# Patient Record
Sex: Female | Born: 1935 | Race: White | Hispanic: No | Smoking: Former smoker
Health system: Southern US, Community
[De-identification: ages and names within clinical notes are randomized; demographics above are authoritative.]

## PROBLEM LIST (undated history)

## (undated) DIAGNOSIS — I10 Essential (primary) hypertension: Secondary | ICD-10-CM

## (undated) DIAGNOSIS — C801 Malignant (primary) neoplasm, unspecified: Secondary | ICD-10-CM

## (undated) DIAGNOSIS — Z950 Presence of cardiac pacemaker: Secondary | ICD-10-CM

## (undated) DIAGNOSIS — M810 Age-related osteoporosis without current pathological fracture: Secondary | ICD-10-CM

## (undated) DIAGNOSIS — I214 Non-ST elevation (NSTEMI) myocardial infarction: Secondary | ICD-10-CM

## (undated) DIAGNOSIS — F32A Depression, unspecified: Secondary | ICD-10-CM

## (undated) DIAGNOSIS — I4891 Unspecified atrial fibrillation: Secondary | ICD-10-CM

## (undated) DIAGNOSIS — I2510 Atherosclerotic heart disease of native coronary artery without angina pectoris: Secondary | ICD-10-CM

## (undated) DIAGNOSIS — IMO0001 Reserved for inherently not codable concepts without codable children: Secondary | ICD-10-CM

## (undated) DIAGNOSIS — F419 Anxiety disorder, unspecified: Secondary | ICD-10-CM

## (undated) DIAGNOSIS — Z9289 Personal history of other medical treatment: Secondary | ICD-10-CM

## (undated) DIAGNOSIS — F329 Major depressive disorder, single episode, unspecified: Secondary | ICD-10-CM

## (undated) DIAGNOSIS — R011 Cardiac murmur, unspecified: Secondary | ICD-10-CM

## (undated) DIAGNOSIS — Z860100 Personal history of colon polyps, unspecified: Secondary | ICD-10-CM

## (undated) DIAGNOSIS — Z8619 Personal history of other infectious and parasitic diseases: Secondary | ICD-10-CM

## (undated) DIAGNOSIS — M1990 Unspecified osteoarthritis, unspecified site: Secondary | ICD-10-CM

## (undated) DIAGNOSIS — N3010 Interstitial cystitis (chronic) without hematuria: Secondary | ICD-10-CM

## (undated) DIAGNOSIS — K219 Gastro-esophageal reflux disease without esophagitis: Secondary | ICD-10-CM

## (undated) DIAGNOSIS — I499 Cardiac arrhythmia, unspecified: Secondary | ICD-10-CM

## (undated) DIAGNOSIS — E785 Hyperlipidemia, unspecified: Secondary | ICD-10-CM

## (undated) HISTORY — DX: Anxiety disorder, unspecified: F41.9

## (undated) HISTORY — PX: EYE SURGERY: SHX253

## (undated) HISTORY — DX: Depression, unspecified: F32.A

## (undated) HISTORY — DX: Interstitial cystitis (chronic) without hematuria: N30.10

## (undated) HISTORY — DX: Personal history of colonic polyps: Z86.010

## (undated) HISTORY — PX: INSERT / REPLACE / REMOVE PACEMAKER: SUR710

## (undated) HISTORY — PX: CORONARY ANGIOPLASTY: SHX604

## (undated) HISTORY — DX: Atherosclerotic heart disease of native coronary artery without angina pectoris: I25.10

## (undated) HISTORY — DX: Personal history of colon polyps, unspecified: Z86.0100

## (undated) HISTORY — DX: Gastro-esophageal reflux disease without esophagitis: K21.9

## (undated) HISTORY — DX: Unspecified atrial fibrillation: I48.91

## (undated) HISTORY — PX: CARDIAC CATHETERIZATION: SHX172

## (undated) HISTORY — DX: Non-ST elevation (NSTEMI) myocardial infarction: I21.4

## (undated) HISTORY — DX: Unspecified osteoarthritis, unspecified site: M19.90

## (undated) HISTORY — DX: Hyperlipidemia, unspecified: E78.5

## (undated) HISTORY — DX: Major depressive disorder, single episode, unspecified: F32.9

## (undated) HISTORY — DX: Age-related osteoporosis without current pathological fracture: M81.0

---

## 1995-02-05 ENCOUNTER — Encounter: Payer: Self-pay | Admitting: Internal Medicine

## 1995-02-05 LAB — CONVERTED CEMR LAB
Blood Glucose, Fasting: 91 mg/dL
TSH: 2.64 microintl units/mL

## 1995-02-06 ENCOUNTER — Encounter: Payer: Self-pay | Admitting: Internal Medicine

## 1995-02-06 LAB — CONVERTED CEMR LAB
TSH: 2.64 microintl units/mL
WBC, blood: 4.6 10*3/uL

## 1995-02-12 ENCOUNTER — Encounter: Payer: Self-pay | Admitting: Internal Medicine

## 1996-02-08 ENCOUNTER — Encounter: Payer: Self-pay | Admitting: Internal Medicine

## 1996-02-08 LAB — CONVERTED CEMR LAB: TSH: 3.39 microintl units/mL

## 1996-02-10 ENCOUNTER — Encounter: Payer: Self-pay | Admitting: Internal Medicine

## 1997-02-13 ENCOUNTER — Encounter: Payer: Self-pay | Admitting: Internal Medicine

## 1997-02-13 LAB — CONVERTED CEMR LAB
Blood Glucose, Fasting: 91 mg/dL
Pap Smear: NORMAL

## 1997-11-02 ENCOUNTER — Encounter: Payer: Self-pay | Admitting: Internal Medicine

## 1997-11-02 LAB — CONVERTED CEMR LAB: TSH: 5 microintl units/mL

## 1998-02-14 ENCOUNTER — Encounter: Payer: Self-pay | Admitting: Internal Medicine

## 1998-02-14 LAB — CONVERTED CEMR LAB: RBC count: 4.39 10*6/uL

## 1998-02-16 ENCOUNTER — Encounter: Payer: Self-pay | Admitting: Internal Medicine

## 1998-02-16 LAB — CONVERTED CEMR LAB: Pap Smear: NORMAL

## 1999-03-13 ENCOUNTER — Encounter: Payer: Self-pay | Admitting: Internal Medicine

## 1999-03-13 LAB — CONVERTED CEMR LAB: Blood Glucose, Fasting: 82 mg/dL

## 1999-03-14 ENCOUNTER — Encounter: Payer: Self-pay | Admitting: Internal Medicine

## 2000-04-27 ENCOUNTER — Encounter: Payer: Self-pay | Admitting: Internal Medicine

## 2000-04-27 LAB — CONVERTED CEMR LAB
Blood Glucose, Fasting: 80 mg/dL
Pap Smear: NORMAL

## 2001-04-30 ENCOUNTER — Encounter: Payer: Self-pay | Admitting: Internal Medicine

## 2001-06-11 ENCOUNTER — Encounter: Payer: Self-pay | Admitting: Internal Medicine

## 2002-05-04 ENCOUNTER — Encounter: Payer: Self-pay | Admitting: Internal Medicine

## 2002-05-05 ENCOUNTER — Encounter: Payer: Self-pay | Admitting: Internal Medicine

## 2002-05-05 LAB — CONVERTED CEMR LAB: Blood Glucose, Fasting: 99 mg/dL

## 2003-07-21 ENCOUNTER — Other Ambulatory Visit: Admission: RE | Admit: 2003-07-21 | Discharge: 2003-07-21 | Payer: Self-pay | Admitting: Internal Medicine

## 2003-07-21 ENCOUNTER — Encounter: Payer: Self-pay | Admitting: Internal Medicine

## 2003-08-19 HISTORY — PX: CATARACT EXTRACTION: SUR2

## 2004-08-13 ENCOUNTER — Emergency Department: Payer: Self-pay | Admitting: Emergency Medicine

## 2004-08-13 ENCOUNTER — Encounter: Payer: Self-pay | Admitting: Internal Medicine

## 2004-08-13 ENCOUNTER — Other Ambulatory Visit: Payer: Self-pay

## 2004-08-13 LAB — CONVERTED CEMR LAB
RBC count: 4.55 10*6/uL
WBC, blood: 5.9 10*3/uL

## 2004-08-15 ENCOUNTER — Ambulatory Visit: Payer: Self-pay | Admitting: Family Medicine

## 2004-08-20 ENCOUNTER — Ambulatory Visit: Payer: Self-pay | Admitting: Internal Medicine

## 2004-09-16 ENCOUNTER — Ambulatory Visit: Payer: Self-pay | Admitting: Internal Medicine

## 2004-12-13 ENCOUNTER — Ambulatory Visit: Payer: Self-pay | Admitting: Internal Medicine

## 2005-03-12 ENCOUNTER — Encounter: Payer: Self-pay | Admitting: Internal Medicine

## 2005-05-06 ENCOUNTER — Ambulatory Visit: Payer: Self-pay | Admitting: Internal Medicine

## 2005-08-18 HISTORY — PX: COLONOSCOPY: SHX174

## 2005-09-26 ENCOUNTER — Ambulatory Visit: Payer: Self-pay | Admitting: Internal Medicine

## 2006-03-27 ENCOUNTER — Ambulatory Visit: Payer: Self-pay | Admitting: Internal Medicine

## 2006-04-01 ENCOUNTER — Ambulatory Visit: Payer: Self-pay | Admitting: Unknown Physician Specialty

## 2006-07-06 ENCOUNTER — Ambulatory Visit (HOSPITAL_BASED_OUTPATIENT_CLINIC_OR_DEPARTMENT_OTHER): Admission: RE | Admit: 2006-07-06 | Discharge: 2006-07-06 | Payer: Self-pay | Admitting: Urology

## 2006-09-29 ENCOUNTER — Ambulatory Visit: Payer: PPO | Admitting: Internal Medicine

## 2007-04-30 ENCOUNTER — Ambulatory Visit: Payer: Self-pay | Admitting: Internal Medicine

## 2007-04-30 DIAGNOSIS — R350 Frequency of micturition: Secondary | ICD-10-CM

## 2007-05-26 ENCOUNTER — Telehealth (INDEPENDENT_AMBULATORY_CARE_PROVIDER_SITE_OTHER): Payer: Self-pay | Admitting: *Deleted

## 2007-07-19 ENCOUNTER — Telehealth (INDEPENDENT_AMBULATORY_CARE_PROVIDER_SITE_OTHER): Payer: Self-pay | Admitting: *Deleted

## 2007-07-23 ENCOUNTER — Encounter: Payer: Self-pay | Admitting: Internal Medicine

## 2007-07-23 DIAGNOSIS — E785 Hyperlipidemia, unspecified: Secondary | ICD-10-CM

## 2007-07-23 DIAGNOSIS — F39 Unspecified mood [affective] disorder: Secondary | ICD-10-CM

## 2007-07-23 DIAGNOSIS — F329 Major depressive disorder, single episode, unspecified: Secondary | ICD-10-CM | POA: Insufficient documentation

## 2007-07-23 DIAGNOSIS — K449 Diaphragmatic hernia without obstruction or gangrene: Secondary | ICD-10-CM | POA: Insufficient documentation

## 2007-07-23 DIAGNOSIS — N3010 Interstitial cystitis (chronic) without hematuria: Secondary | ICD-10-CM

## 2007-07-23 DIAGNOSIS — M810 Age-related osteoporosis without current pathological fracture: Secondary | ICD-10-CM | POA: Insufficient documentation

## 2007-07-23 DIAGNOSIS — Z8719 Personal history of other diseases of the digestive system: Secondary | ICD-10-CM

## 2007-07-23 DIAGNOSIS — M1990 Unspecified osteoarthritis, unspecified site: Secondary | ICD-10-CM | POA: Insufficient documentation

## 2007-07-23 DIAGNOSIS — D126 Benign neoplasm of colon, unspecified: Secondary | ICD-10-CM

## 2007-07-23 DIAGNOSIS — I25119 Atherosclerotic heart disease of native coronary artery with unspecified angina pectoris: Secondary | ICD-10-CM

## 2007-07-24 DIAGNOSIS — K219 Gastro-esophageal reflux disease without esophagitis: Secondary | ICD-10-CM

## 2007-08-24 ENCOUNTER — Telehealth (INDEPENDENT_AMBULATORY_CARE_PROVIDER_SITE_OTHER): Payer: Self-pay | Admitting: *Deleted

## 2007-09-01 ENCOUNTER — Telehealth: Payer: Self-pay | Admitting: Internal Medicine

## 2007-10-13 ENCOUNTER — Encounter: Payer: Self-pay | Admitting: Internal Medicine

## 2007-10-13 ENCOUNTER — Ambulatory Visit: Payer: PPO | Admitting: Internal Medicine

## 2007-10-19 ENCOUNTER — Encounter (INDEPENDENT_AMBULATORY_CARE_PROVIDER_SITE_OTHER): Payer: Self-pay | Admitting: *Deleted

## 2007-10-19 LAB — HM MAMMOGRAPHY: HM Mammogram: NORMAL

## 2007-11-23 ENCOUNTER — Encounter: Payer: Self-pay | Admitting: Internal Medicine

## 2007-12-17 ENCOUNTER — Ambulatory Visit: Payer: Self-pay | Admitting: Internal Medicine

## 2007-12-20 LAB — CONVERTED CEMR LAB
Albumin: 4.4 g/dL (ref 3.5–5.2)
Basophils Absolute: 0 10*3/uL (ref 0.0–0.1)
Bilirubin, Direct: 0.1 mg/dL (ref 0.0–0.3)
CO2: 30 meq/L (ref 19–32)
Calcium: 10.2 mg/dL (ref 8.4–10.5)
Chloride: 107 meq/L (ref 96–112)
Creatinine, Ser: 0.9 mg/dL (ref 0.4–1.2)
Eosinophils Relative: 2.3 % (ref 0.0–5.0)
GFR calc Af Amer: 78 mL/min
Glucose, Bld: 99 mg/dL (ref 70–99)
HCT: 42.6 % (ref 36.0–46.0)
Lymphocytes Relative: 29.8 % (ref 12.0–46.0)
MCHC: 34.2 g/dL (ref 30.0–36.0)
Neutrophils Relative %: 55.5 % (ref 43.0–77.0)
Platelets: 218 10*3/uL (ref 150–400)
Potassium: 5.5 meq/L — ABNORMAL HIGH (ref 3.5–5.1)
RBC: 4.52 M/uL (ref 3.87–5.11)
RDW: 12.5 % (ref 11.5–14.6)
Sodium: 143 meq/L (ref 135–145)
Total CHOL/HDL Ratio: 3.6
Total Protein: 7.2 g/dL (ref 6.0–8.3)
Triglycerides: 110 mg/dL (ref 0–149)
WBC: 4.3 10*3/uL — ABNORMAL LOW (ref 4.5–10.5)

## 2007-12-27 ENCOUNTER — Telehealth (INDEPENDENT_AMBULATORY_CARE_PROVIDER_SITE_OTHER): Payer: Self-pay | Admitting: *Deleted

## 2008-01-25 ENCOUNTER — Telehealth: Payer: Self-pay | Admitting: Internal Medicine

## 2008-05-19 ENCOUNTER — Telehealth: Payer: Self-pay | Admitting: Internal Medicine

## 2008-09-25 ENCOUNTER — Telehealth: Payer: Self-pay | Admitting: Internal Medicine

## 2008-10-19 ENCOUNTER — Encounter: Payer: Self-pay | Admitting: Internal Medicine

## 2008-10-19 ENCOUNTER — Telehealth: Payer: Self-pay | Admitting: Internal Medicine

## 2008-10-19 ENCOUNTER — Ambulatory Visit: Payer: PPO | Admitting: Internal Medicine

## 2008-10-23 ENCOUNTER — Encounter: Payer: Self-pay | Admitting: Internal Medicine

## 2009-01-18 ENCOUNTER — Telehealth: Payer: Self-pay | Admitting: Internal Medicine

## 2009-03-09 ENCOUNTER — Telehealth: Payer: Self-pay | Admitting: Internal Medicine

## 2009-03-16 ENCOUNTER — Telehealth (INDEPENDENT_AMBULATORY_CARE_PROVIDER_SITE_OTHER): Payer: Self-pay | Admitting: *Deleted

## 2009-04-17 ENCOUNTER — Ambulatory Visit: Payer: Self-pay | Admitting: Internal Medicine

## 2009-04-18 LAB — CONVERTED CEMR LAB
ALT: 13 units/L (ref 0–35)
AST: 21 units/L (ref 0–37)
Albumin: 4.1 g/dL (ref 3.5–5.2)
Alkaline Phosphatase: 88 units/L (ref 39–117)
BUN: 17 mg/dL (ref 6–23)
Basophils Absolute: 0 10*3/uL (ref 0.0–0.1)
Cholesterol: 217 mg/dL — ABNORMAL HIGH (ref 0–200)
Creatinine, Ser: 0.8 mg/dL (ref 0.4–1.2)
Glucose, Bld: 97 mg/dL (ref 70–99)
Hemoglobin: 13.5 g/dL (ref 12.0–15.0)
Lymphocytes Relative: 24.3 % (ref 12.0–46.0)
MCHC: 34.5 g/dL (ref 30.0–36.0)
MCV: 95.6 fL (ref 78.0–100.0)
Monocytes Absolute: 0.7 10*3/uL (ref 0.1–1.0)
Platelets: 182 10*3/uL (ref 150.0–400.0)
RBC: 4.08 M/uL (ref 3.87–5.11)
TSH: 4.15 microintl units/mL (ref 0.35–5.50)
Total Protein: 7.1 g/dL (ref 6.0–8.3)
Triglycerides: 93 mg/dL (ref 0.0–149.0)
VLDL: 18.6 mg/dL (ref 0.0–40.0)
WBC: 4.7 10*3/uL (ref 4.5–10.5)

## 2009-05-07 ENCOUNTER — Ambulatory Visit: Payer: Self-pay | Admitting: Internal Medicine

## 2009-05-08 LAB — CONVERTED CEMR LAB: Potassium: 5.2 meq/L — ABNORMAL HIGH (ref 3.5–5.1)

## 2009-08-13 ENCOUNTER — Inpatient Hospital Stay: Payer: PPO | Admitting: Internal Medicine

## 2009-08-16 ENCOUNTER — Encounter: Payer: Self-pay | Admitting: Internal Medicine

## 2009-08-23 ENCOUNTER — Encounter: Payer: Self-pay | Admitting: Internal Medicine

## 2009-08-29 ENCOUNTER — Ambulatory Visit: Payer: Self-pay | Admitting: Internal Medicine

## 2009-08-29 DIAGNOSIS — I4891 Unspecified atrial fibrillation: Secondary | ICD-10-CM | POA: Insufficient documentation

## 2009-10-01 ENCOUNTER — Telehealth: Payer: Self-pay | Admitting: Internal Medicine

## 2009-10-02 ENCOUNTER — Telehealth: Payer: Self-pay | Admitting: Internal Medicine

## 2009-10-08 ENCOUNTER — Ambulatory Visit: Payer: Self-pay | Admitting: Internal Medicine

## 2009-11-27 ENCOUNTER — Ambulatory Visit: Payer: Self-pay | Admitting: Family Medicine

## 2009-11-27 DIAGNOSIS — R300 Dysuria: Secondary | ICD-10-CM

## 2009-11-27 LAB — CONVERTED CEMR LAB
Blood in Urine, dipstick: NEGATIVE
Ketones, urine, test strip: NEGATIVE
Nitrite: NEGATIVE
pH: 5

## 2009-11-28 ENCOUNTER — Encounter: Payer: Self-pay | Admitting: Family Medicine

## 2010-01-16 ENCOUNTER — Telehealth: Payer: Self-pay | Admitting: Internal Medicine

## 2010-02-04 ENCOUNTER — Ambulatory Visit: Payer: Self-pay | Admitting: Internal Medicine

## 2010-03-27 ENCOUNTER — Encounter: Payer: Self-pay | Admitting: Internal Medicine

## 2010-05-06 ENCOUNTER — Ambulatory Visit (HOSPITAL_BASED_OUTPATIENT_CLINIC_OR_DEPARTMENT_OTHER): Admission: RE | Admit: 2010-05-06 | Discharge: 2010-05-06 | Payer: Self-pay | Admitting: Urology

## 2010-05-07 ENCOUNTER — Telehealth: Payer: Self-pay | Admitting: Internal Medicine

## 2010-06-17 ENCOUNTER — Ambulatory Visit: Payer: Self-pay | Admitting: Internal Medicine

## 2010-06-17 DIAGNOSIS — K5909 Other constipation: Secondary | ICD-10-CM

## 2010-06-18 LAB — CONVERTED CEMR LAB
Alkaline Phosphatase: 110 units/L (ref 39–117)
BUN: 18 mg/dL (ref 6–23)
Basophils Absolute: 0 10*3/uL (ref 0.0–0.1)
Chloride: 96 meq/L (ref 96–112)
Cholesterol: 267 mg/dL — ABNORMAL HIGH (ref 0–200)
Direct LDL: 150.6 mg/dL
Eosinophils Absolute: 0.1 10*3/uL (ref 0.0–0.7)
GFR calc non Af Amer: 81.08 mL/min (ref >60)
HDL: 83 mg/dL (ref >39.00)
Lymphocytes Relative: 26.8 % (ref 12.0–46.0)
Lymphs Abs: 1.2 10*3/uL (ref 0.7–4.0)
MCHC: 35.5 g/dL (ref 30.0–36.0)
MCV: 93.3 fL (ref 78.0–100.0)
Phosphorus: 3.6 mg/dL (ref 2.3–4.6)
Potassium: 4.1 meq/L (ref 3.5–5.1)
Sodium: 140 meq/L (ref 135–145)
Total CHOL/HDL Ratio: 3
VLDL: 27.6 mg/dL (ref 0.0–40.0)
WBC: 4.3 10*3/uL — ABNORMAL LOW (ref 4.5–10.5)

## 2010-08-07 ENCOUNTER — Telehealth: Payer: Self-pay | Admitting: Internal Medicine

## 2010-08-26 ENCOUNTER — Telehealth: Payer: Self-pay | Admitting: Internal Medicine

## 2010-09-19 NOTE — Progress Notes (Signed)
Summary: Mammogram or not  Phone Note Call from Patient   Caller: Patient Summary of Call: Patient just received her notice thart she is due for her annual MMG at [facility]. She remembers you saying that she did not need anymore MMGs. Please advise if she should have one or not. Please call the patient back at [phone]. Initial call taken by: Carlton Adam,  October 02, 2009 11:59 AM  Follow-up for Phone Call        I really don't recommend them after 77 years of age--they can cause more trouble than they help She should let them know she is not getting it so they don't continue to send reminders Follow-up by: Cindee Salt MD,  October 02, 2009 12:46 PM  Additional Follow-up for Phone Call Additional follow up Details #1::        Patient advised as instructed. Additional Follow-up by: Linde Gillis CMA Duncan Dull),  October 02, 2009 12:51 PM

## 2010-09-19 NOTE — Letter (Signed)
[facility]   Imported By: Beau Fanny 08/29/2009 15:09:26  _____________________________________________________________________  External Attachment:    Type:   Image     Comment:   External Document

## 2010-09-19 NOTE — Assessment & Plan Note (Signed)
Summary: 4 MONTH FOLLOW UP/[facility]   Vital Signs:  Patient profile:   77 year old female Weight:      99 pounds Temp:     97.8 degrees F oral Pulse rate:   60 / minute Pulse rhythm:   regular BP sitting:   158 / 70  (left arm) Cuff size:   regular  Vitals Entered By: Mervin Hack CMA [facility] Dull) (June 17, 2010 12:23 PM) CC: 4 month follow-up   History of Present Illness: Having some trouble with constipation Not sure if her meds could be causing Hasn't used anything regularly couldn't tolerate correctol--the dye irritated her bladder discussed trying miralax  Heart has been quiet had to stop the atenolol No sig tachycardia just on the diltiazem Some leg itching and occ red but no sig edema  No chest pain Breathing has been fine  ongoing bladder problems but nothing new  Allergies: 1)  ! Asa 2)  ! * Hctz 3)  Diazepam (Diazepam)  Past History:  Past medical, surgical, family and social histories (including risk factors) reviewed for relevance to current acute and chronic problems.  Past Medical History: Reviewed history from 08/29/2009 and no changes required. Anxiety: Depression Hyperlipidemia Osteoarthritis Osteoporosis Interstitial cystitis Colonic polyps, hx of GERD Coronary artery disease Atrial fibrillation  Past Surgical History: Reviewed history from 08/29/2009 and no changes required. Colonoscopy:(1996) PTCA:(05/1998) Right CTS :(2003) Cardiolite stress negative:(10/2002) Echo-- LV EF- 60%:(10/2002) Cataract on right  ~2005 Chest pain/rapid atrial fib   12/10  Family History: Reviewed history from 12/17/2007 and no changes required. Dad had MI @62  Mom died @82  with CAD Brother with CABG Half sister died @62  of MI DM in 1 brother and pat uncle Mom with HTN 1 sister with brain cancer sister with ovarian cancer Brother with CAD, bladder cancer found close to his death. Had rheumatic heart disease  Social History: Reviewed history from  12/17/2007 and no changes required. Married--1 child Former Smoker Alcohol use-no Retired Conservation officer, historic buildings outlet  Review of Systems       eating okay appetite is fine sleeps fine on zolpidem every night--has been on this for years  Physical Exam  General:  alert and normal appearance.   Neck:  supple, no masses, no thyromegaly, and no cervical lymphadenopathy.   Lungs:  normal respiratory effort, no intercostal retractions, no accessory muscle use, and normal breath sounds.   Heart:  normal rate, regular rhythm, and no gallop.   Gr 2/6 systolic murmur loudest at LLSB Abdomen:  soft and non-tender.   Extremities:  no edema Psych:  normally interactive, good eye contact, not anxious appearing, and not depressed appearing.     Impression & Recommendations:  Problem # 1:  OTHER CONSTIPATION (ICD-564.09) Assessment Comment Only seems to be notable of late discussed trying miralax  Her updated medication list for this problem includes:    Polyethylene Glycol 3350 Powd (Polyethylene glycol 3350) .Marland Kitchen... 1 capful daily mixed with water to prevent constipation  Problem # 2:  INTERSTITIAL CYSTITIS (ICD-595.1) Assessment: Unchanged doing okay if she is careful about avoiding dyes, etc  Problem # 3:  ATRIAL FIBRILLATION (ICD-427.31) Assessment: Unchanged  no sig paroxysms just on the diltiazem due for labs  The following medications were removed from the medication list:    Atenolol 25 Mg Tabs (Atenolol) .Marland Kitchen... Take 1 tablet by mouth once a day Her updated medication list for this problem includes:    Diltiazem Hcl Cr 180 Mg Xr24h-cap (Diltiazem hcl) .Marland Kitchen... 1 capsule  daily for fast heart beat    Aspirin 81 Mg Tabs (Aspirin) .Marland Kitchen... Take 1 by mouth once daily  Orders: TLB-Renal Function Panel (80069-RENAL) TLB-CBC Platelet - w/Differential (85025-CBCD) TLB-Hepatic/Liver Function Pnl (80076-HEPATIC) TLB-TSH (Thyroid Stimulating Hormone) (84443-TSH) Venipuncture  (04540)  Problem # 4:  ANXIETY (ICD-300.00) Assessment: Unchanged mood has generally been good doesn't use xanax anymore  The following medications were removed from the medication list:    Alprazolam 0.25 Mg Tabs (Alprazolam) .Marland Kitchen... Take 1 by mouth every 8 hours as needed Her updated medication list for this problem includes:    Paxil 20 Mg Tabs (Paroxetine hcl) .Marland Kitchen... Take 1/2  tablet by mouth once a day  Complete Medication List: 1)  Zolpidem Tartrate 10 Mg Tabs (Zolpidem tartrate) .... Take 1 tablet by mouth at bedtime 2)  Paxil 20 Mg Tabs (Paroxetine hcl) .... Take 1/2  tablet by mouth once a day 3)  Diltiazem Hcl Cr 180 Mg Xr24h-cap (Diltiazem hcl) .Marland Kitchen.. 1 capsule  daily for fast heart beat 4)  Glucosamine-chondroitin 500-400 Mg Caps (Glucosamine-chondroitin) .... Take 1 tablet by mouth once a day 5)  Calcium-vitamin D 250-125 Mg-unit Tabs (Calcium carbonate-vitamin d) .... Take 1 by mouth once daily 6)  Himalayan Goji 60 Mg/27ml Liqd (Misc natural products) .... As needed 7)  Aspirin 81 Mg Tabs (Aspirin) .... Take 1 by mouth once daily 8)  Polyethylene Glycol 3350 Powd (Polyethylene glycol 3350) .Marland Kitchen.. 1 capful daily mixed with water to prevent constipation  Other Orders: TLB-Lipid Panel (80061-LIPID)  Patient Instructions: 1)  Please try the miralax (polyethylene glycol) daily and adjust as needed to keep your bowels regular 2)  Please schedule a follow-up appointment in 6 months .    Orders Added: 1)  TLB-Renal Function Panel [80069-RENAL] 2)  TLB-CBC Platelet - w/Differential [85025-CBCD] 3)  TLB-Hepatic/Liver Function Pnl [80076-HEPATIC] 4)  TLB-TSH (Thyroid Stimulating Hormone) [84443-TSH] 5)  Venipuncture [98119] 6)  Est. Patient Level IV [14782] 7)  TLB-Lipid Panel [80061-LIPID]    Current Allergies (reviewed today): ! ASA ! * HCTZ DIAZEPAM (DIAZEPAM)

## 2010-09-19 NOTE — Progress Notes (Signed)
Summary: PAXIL   Phone Note Refill Request Call back at [phone] Message from:  Medicap on August 07, 2010 2:51 PM  Refills Requested: Medication #1:  PAXIL 20 MG  TABS Take 1/2  tablet by mouth once a day   Last Refilled: 04/13/2010 Received faxed refill request please advise.  Form in your IN box.   Method Requested: Fax to Local Pharmacy Initial call taken by: Linde Gillis CMA [facility] Dull),  August 07, 2010 2:51 PM    Prescriptions: PAXIL 20 MG  TABS (PAROXETINE HCL) Take 1/2  tablet by mouth once a day  #30 x 0   Entered by:   Mervin Hack CMA (AAMA)   Authorized by:   Cindee Salt MD   Signed by:   Mervin Hack CMA (AAMA) on 08/07/2010   Method used:   Electronically to        [facility] [phone]* (retail)       [address] [place], [place]  [address]       Ph: [phone]       Fax: [phone]   RxID:   [id number]

## 2010-09-19 NOTE — Progress Notes (Signed)
Summary: Rx Zolpidem  Phone Note Refill Request Call back at [phone] Message from:  Medicap on October 01, 2009 2:40 PM  Refills Requested: Medication #1:  ZOLPIDEM TARTRATE 10 MG TABS Take 1 tablet by mouth at bedtime   Last Refilled: 08/31/2009 Received faxed refill request, please advise.  Form in your IN box.   Method Requested: Telephone to Pharmacy Initial call taken by: Linde Gillis CMA [facility] Dull),  October 01, 2009 2:41 PM  Follow-up for Phone Call        okay #30 x 3 Follow-up by: Cindee Salt MD,  October 01, 2009 5:13 PM  Additional Follow-up for Phone Call Additional follow up Details #1::        Rx faxed to pharmacy, [phone]. Additional Follow-up by: Linde Gillis CMA [facility] Dull),  October 01, 2009 5:18 PM    Prescriptions: ZOLPIDEM TARTRATE 10 MG TABS (ZOLPIDEM TARTRATE) Take 1 tablet by mouth at bedtime  #30 x 3   Entered by:   Linde Gillis CMA (AAMA)   Authorized by:   Cindee Salt MD   Signed by:   Linde Gillis CMA (AAMA) on 10/01/2009   Method used:   Telephoned to ...       [facility] [address] [phone]* (retail)       [address] [place], [place]  [address]       Ph: [phone]       Fax: [phone]   RxID:   [id number]

## 2010-09-19 NOTE — Progress Notes (Signed)
Summary: zoldipem   Phone Note Refill Request Message from:  Patient on May 07, 2010 2:38 PM  Refills Requested: Medication #1:  ZOLPIDEM TARTRATE 10 MG TABS Take 1 tablet by mouth at bedtime   Last Refilled: 04/08/2010 Refill request from medicap. Form is on your desk.   Initial call taken by: Melody Comas,  May 07, 2010 2:40 PM  Follow-up for Phone Call        okay #30 x 3 Follow-up by: Cindee Salt MD,  May 08, 2010 7:54 AM  Additional Follow-up for Phone Call Additional follow up Details #1::        Rx faxed to pharmacy Additional Follow-up by: DeShannon Smith CMA [facility] Dull),  May 08, 2010 9:34 AM    Prescriptions: ZOLPIDEM TARTRATE 10 MG TABS (ZOLPIDEM TARTRATE) Take 1 tablet by mouth at bedtime  #30 x 3   Entered by:   Mervin Hack CMA (AAMA)   Authorized by:   Cindee Salt MD   Signed by:   Mervin Hack CMA (AAMA) on 05/08/2010   Method used:   Handwritten   RxID:   [id number]

## 2010-09-19 NOTE — Letter (Signed)
Summary: Cardiology/[facility]  [facility]   Imported By: Lester Donegal 05/06/2010 10:28:36  _____________________________________________________________________  External Attachment:    Type:   Image     Comment:   External Document  Appended Document: Cardiology/[facility] she stopped atenolol--it was making her fatigued Dr Darrold Junker approved staying off

## 2010-09-19 NOTE — Assessment & Plan Note (Signed)
Summary: 1 MTH FU/CLE   Vital Signs:  Patient profile:   77 year old female Weight:      105 pounds Temp:     97.9 degrees F oral Pulse rate:   58 / minute Pulse rhythm:   regular BP sitting:   158 / 70  (left arm) Cuff size:   regular  Vitals Entered By: Mervin Hack CMA Duncan Dull) (October 08, 2009 10:58 AM) CC: 1 month follow-up   History of Present Illness: Heart seems to have settled down Has not felt any flutters or irregular heart  beat in the past 2 weeks Only occ before that  No dizziness or light headedness no syncope No chest pain Breathing has been good has noticed that she has slowed down her walking rate Wal[place] on treadmill at [place]3 miles per hour and rarely goes faster too Occ does bicycle also Hasn't had to give up any household tasks  No sig anxiety Occ down but no persistent problems stress with sister having ovarian cancer, etc---but she "snaps out of it"  Allergies: 1)  ! Asa 2)  ! * Hctz  Past History:  Past medical, surgical, family and social histories (including risk factors) reviewed for relevance to current acute and chronic problems.  Past Medical History: Reviewed history from 08/29/2009 and no changes required. Anxiety: Depression Hyperlipidemia Osteoarthritis Osteoporosis Interstitial cystitis Colonic polyps, hx of GERD Coronary artery disease Atrial fibrillation  Past Surgical History: Reviewed history from 08/29/2009 and no changes required. Colonoscopy:(1996) PTCA:(05/1998) Right CTS :(2003) Cardiolite stress negative:(10/2002) Echo-- LV EF- 60%:(10/2002) Cataract on right  ~2005 Chest pain/rapid atrial fib   12/10  Family History: Reviewed history from 12/17/2007 and no changes required. Dad had MI @62  Mom died @82  with CAD Brother with CABG Half sister died @62  of MI DM in 1 brother and pat uncle Mom with HTN 1 sister with brain cancer sister with ovarian cancer Brother with CAD, bladder cancer found close to  his death. Had rheumatic heart disease  Social History: Reviewed history from 12/17/2007 and no changes required. Married--1 child Former Smoker Alcohol use-no Retired Conservation officer, historic buildings outlet  Review of Systems       appetite is good sleeping well weight is stable  Physical Exam  General:  alert and normal appearance.   Neck:  supple, no masses, no thyromegaly, and no cervical lymphadenopathy.  Referred aortic murmur in carotids Lungs:  normal respiratory effort and normal breath sounds.   Heart:  regular rhythm, no gallop, and bradycardia.   Gr 2/6 coarse aortic systolic murmur at base to carotids Abdomen:  soft and non-tender.   Extremities:  no edema Psych:  normally interactive, good eye contact, not anxious appearing, and not depressed appearing.     Impression & Recommendations:  Problem # 1:  ATRIAL FIBRILLATION (ICD-427.31) Assessment Comment Only still regular on exam mild bradycardia but no symptoms no changes now  Her updated medication list for this problem includes:    Atenolol 25 Mg Tabs (Atenolol) .Marland Kitchen... Take 1 tablet by mouth once a day    Aspirin 81 Mg Tabs (Aspirin) .Marland Kitchen... Take 1 by mouth once daily    Diltiazem Hcl Cr 180 Mg Xr24h-cap (Diltiazem hcl) .Marland Kitchen... 1/2  tab daily for fast heart beat  Problem # 2:  CORONARY ARTERY DISEASE (ICD-414.00) Assessment: Unchanged stable exercise tolerance (good for her age) discussed warning signs and 911  Her updated medication list for this problem includes:    Atenolol 25 Mg Tabs (Atenolol) .Marland Kitchen... Take  1 tablet by mouth once a day    Aspirin 81 Mg Tabs (Aspirin) .Marland Kitchen... Take 1 by mouth once daily    Diltiazem Hcl Cr 180 Mg Xr24h-cap (Diltiazem hcl) .Marland Kitchen... 1/2  tab daily for fast heart beat  Problem # 3:  HYPERLIPIDEMIA (ICD-272.4) Assessment: Comment Only last LDL 99 without meds hasn't tolerated in the past so no meds  Problem # 4:  ANXIETY (ICD-300.00) Assessment: Unchanged controlled with meds  Her  updated medication list for this problem includes:    Paxil 20 Mg Tabs (Paroxetine hcl) .Marland Kitchen... Take 1/2  tablet by mouth once a day    Alprazolam 0.25 Mg Tabs (Alprazolam) .Marland Kitchen... Take 1 by mouth every 8 hours as needed  Complete Medication List: 1)  Atenolol 25 Mg Tabs (Atenolol) .... Take 1 tablet by mouth once a day 2)  Zolpidem Tartrate 10 Mg Tabs (Zolpidem tartrate) .... Take 1 tablet by mouth at bedtime 3)  Paxil 20 Mg Tabs (Paroxetine hcl) .... Take 1/2  tablet by mouth once a day 4)  Calcium-vitamin D 250-125 Mg-unit Tabs (Calcium carbonate-vitamin d) .... Take 1 by mouth once daily 5)  Himalayan Goji 60 Mg/30ml Liqd (Misc natural products) .... As needed 6)  Aspirin 81 Mg Tabs (Aspirin) .... Take 1 by mouth once daily 7)  Alprazolam 0.25 Mg Tabs (Alprazolam) .... Take 1 by mouth every 8 hours as needed 8)  Diltiazem Hcl Cr 180 Mg Xr24h-cap (Diltiazem hcl) .... 1/2  tab daily for fast heart beat  Patient Instructions: 1)  Please schedule a follow-up appointment in 4 months .   Current Allergies (reviewed today): ! ASA ! * HCTZ

## 2010-09-19 NOTE — Assessment & Plan Note (Signed)
Summary: ? UTI   Vital Signs:  Patient profile:   77 year old female Height:      61 inches Weight:      102.25 pounds BMI:     19.39 Temp:     97.5 degrees F oral Pulse rate:   84 / minute Pulse rhythm:   regular BP sitting:   136 / 64  (left arm) Cuff size:   regular  Vitals Entered By: Delilah Shan CMA [facility] Dull) (November 27, 2009 12:08 PM) CC: ? UTI   History of Present Illness: 77 yo with h/o Interstitial cystitis here for ?UTI.  She always had increased frequency and suprapubic pressure but she feels as if it has gotten worse over past few days.  Had some mild dysuria last night. No hematuria, back pain, n/v or fevers.    PMH reviewed- sees Dr. Patsi Sears.  Last saw him two years ago, received bladder instillations which did appear to provide some relief.  Current Medications (verified): 1)  Atenolol 25 Mg Tabs (Atenolol) .... Take 1 Tablet By Mouth Once A Day 2)  Zolpidem Tartrate 10 Mg Tabs (Zolpidem Tartrate) .... Take 1 Tablet By Mouth At Bedtime 3)  Paxil 20 Mg  Tabs (Paroxetine Hcl) .... Take 1/2  Tablet By Mouth Once A Day 4)  Calcium-Vitamin D 250-125 Mg-Unit Tabs (Calcium Carbonate-Vitamin D) .... Take 1 By Mouth Once Daily 5)  Himalayan Goji 60 Mg/36ml Liqd (Misc [employer]) .... As Needed 6)  Aspirin 81 Mg Tabs (Aspirin) .... Take 1 By Mouth Once Daily 7)  Alprazolam 0.25 Mg Tabs (Alprazolam) .... Take 1 By Mouth Every 8 Hours As Needed 8)  Diltiazem Hcl Cr 180 Mg Xr24h-Cap (Diltiazem Hcl) .... 1/2  Tab Daily For Fast Heart Beat 9)  Glucosamine-Chondroitin 500-400 Mg Caps (Glucosamine-Chondroitin) .... Take 1 Tablet By Mouth Once A Day  Allergies: 1)  ! Asa 2)  ! * Hctz  Review of Systems      See HPI General:  Denies chills and fever. GI:  Denies nausea and vomiting.  Physical Exam  General:  alert and normal appearance.   Mouth:  MMM Abdomen:  soft and non-tender.   No CVA tenderness Psych:  normally interactive, good eye contact, not anxious  appearing, and not depressed appearing.     Impression & Recommendations:  Problem # 1:  DYSURIA (ICD-788.1) Assessment New UA neg but will send for culture. Advised making f/u with Dr. Patsi Sears as she likely needs another bladder instillation. Pt agreed with plan. Orders: UA Dipstick w/o Micro (manual) ([address]) T-Culture, Urine ([id number])  Complete Medication List: 1)  Atenolol 25 Mg Tabs (Atenolol) .... Take 1 tablet by mouth once a day 2)  Zolpidem Tartrate 10 Mg Tabs (Zolpidem tartrate) .... Take 1 tablet by mouth at bedtime 3)  Paxil 20 Mg Tabs (Paroxetine hcl) .... Take 1/2  tablet by mouth once a day 4)  Calcium-vitamin D 250-125 Mg-unit Tabs (Calcium carbonate-vitamin d) .... Take 1 by mouth once daily 5)  Himalayan Goji 60 Mg/69ml Liqd (Misc natural products) .... As needed 6)  Aspirin 81 Mg Tabs (Aspirin) .... Take 1 by mouth once daily 7)  Alprazolam 0.25 Mg Tabs (Alprazolam) .... Take 1 by mouth every 8 hours as needed 8)  Diltiazem Hcl Cr 180 Mg Xr24h-cap (Diltiazem hcl) .... 1/2  tab daily for fast heart beat 9)  Glucosamine-chondroitin 500-400 Mg Caps (Glucosamine-chondroitin) .... Take 1 tablet by mouth once a day  Current Allergies (reviewed today): ! ASA ! *  HCTZ  Laboratory Results   Urine Tests  Date/Time Received: November 27, 2009 12:35 PM   Routine Urinalysis   Color: lt. yellow Appearance: Clear Glucose: negative   (Normal Range: Negative) Bilirubin: negative   (Normal Range: Negative) Ketone: negative   (Normal Range: Negative) Spec. Gravity: <1.005   (Normal Range: 1.003-1.035) Blood: negative   (Normal Range: Negative) pH: 5.0   (Normal Range: 5.0-8.0) Protein: negative   (Normal Range: Negative) Urobilinogen: 0.2   (Normal Range: 0-1) Nitrite: negative   (Normal Range: Negative) Leukocyte Esterace: negative   (Normal Range: Negative)

## 2010-09-19 NOTE — Assessment & Plan Note (Signed)
Summary: F/U [facility]  D/C 08/16/09/[facility]   Vital Signs:  Patient profile:   77 year old female Weight:      106 pounds Temp:     98 degrees F oral Pulse rate:   56 / minute Pulse rhythm:   regular BP sitting:   140 / 60  (left arm) Cuff size:   regular  Vitals Entered By: Mervin Hack CMA Duncan Dull) (August 29, 2009 12:46 PM) CC: hospital follow-up   History of Present Illness: Here with son  Admitted wtih chest pain and rapid atrial fib in December Seen there by Dr Gwen Pounds  Had noticed occ skipped beats or "flipping" in past Awoke 12/27 with inidgestion feeling Just wouldn't get better Finally to ER--found to be in rapid atrial fib Converted to sinus with IV diltiazem Cath showed multiple non critical blockages discussed but are deferring any intervention  some concern about her thyroid being overactive but no action or follow up planned  Now able to tolerate aspirin along with "Prelief" (calcium glycerophosphate and magnesium sterate  --OTC)  Did see Dr Darrold Junker in follow up heart regular, BP okay so no further plans for now apparently decided not to use coumadin---since reportedly (per son) stayed regular after converting  Changed to metoprolol in hospital she didn't---she has stayed on the atenolol  Did measure pulses in upper 30's last night felt sluggish then No syncope slight dizziness yesterday in grocery store--mild only  No statin due to past bladder problems with the meds  Finally received discharge summary Did have non-Q MI prompting cath Hyperglycemia but A1c was normal at 6.1% LDL was 99 TSH was high but normal free T4 levels discharged on plavix but stopped when she was able to tolerate the aspirin  Allergies: 1)  ! Asa 2)  ! * Hctz  Past History:  Past Medical History: Anxiety: Depression Hyperlipidemia Osteoarthritis Osteoporosis Interstitial cystitis Colonic polyps, hx of GERD Coronary artery disease Atrial fibrillation  Past  Surgical History: Colonoscopy:(1996) PTCA:(05/1998) Right CTS :(2003) Cardiolite stress negative:(10/2002) Echo-- LV EF- 60%:(10/2002) Cataract on right  ~2005 Chest pain/rapid atrial fib   12/10  Review of Systems       did notice some bladder irriation with aspirin --doing okay with OTC supplement appetite is okay weight stable  Physical Exam  General:  alert and normal appearance.   Neck:  supple, no masses, no thyromegaly, no carotid bruits, and no cervical lymphadenopathy.   Lungs:  normal respiratory effort and normal breath sounds.   Heart:  regular rhythm, no gallop, and bradycardia.   Rate of 48 on recheck soft systolic murmur at base Abdomen:  soft and non-tender.   Extremities:  no edema Psych:  normally interactive, good eye contact, not anxious appearing, and not depressed appearing.     Impression & Recommendations:  Problem # 1:  ATRIAL FIBRILLATION (ICD-427.31) Assessment New  rhythm is now regular Bradycardia with mild symptoms now COuld have sick sinus----pacemaker may need to be considered for now, will decrease diltiazem to 180 she prefers atenolol which she has done well with for years to new metoprolol--will not change coumadin should be considered if any recurrences of a fib may want to consider EP eval  The following medications were removed from the medication list:    Diltiazem Hcl Cr 240 Mg Xr24h-cap (Diltiazem hcl) .Marland Kitchen... Take 1 by mouth once daily Her updated medication list for this problem includes:    Atenolol 25 Mg Tabs (Atenolol) .Marland Kitchen... Take 1 tablet by mouth once  a day    Aspirin 81 Mg Tabs (Aspirin) .Marland Kitchen... Take 1 by mouth once daily    Diltiazem Hcl Cr 180 Mg Xr24h-cap (Diltiazem hcl) .Marland Kitchen... 1 tab daily for fast heart beat  Orders: EKG w/ Interpretation (93000)  Problem # 2:  CORONARY ARTERY DISEASE (ICD-414.00) Assessment: Comment Only new non-Q MI likely from demand ischemia with tachycardia agree with medical management LDL  already under 100 without meds  The following medications were removed from the medication list:    Diltiazem Hcl Cr 240 Mg Xr24h-cap (Diltiazem hcl) .Marland Kitchen... Take 1 by mouth once daily Her updated medication list for this problem includes:    Atenolol 25 Mg Tabs (Atenolol) .Marland Kitchen... Take 1 tablet by mouth once a day    Aspirin 81 Mg Tabs (Aspirin) .Marland Kitchen... Take 1 by mouth once daily    Diltiazem Hcl Cr 180 Mg Xr24h-cap (Diltiazem hcl) .Marland Kitchen... 1 tab daily for fast heart beat  Complete Medication List: 1)  Atenolol 25 Mg Tabs (Atenolol) .... Take 1 tablet by mouth once a day 2)  Zolpidem Tartrate 10 Mg Tabs (Zolpidem tartrate) .... Take 1 tablet by mouth at bedtime 3)  Paxil 20 Mg Tabs (Paroxetine hcl) .... Take 1/2  tablet by mouth once a day 4)  Calcium-vitamin D 250-125 Mg-unit Tabs (Calcium carbonate-vitamin d) .... Take 1 by mouth once daily 5)  Himalayan Goji 60 Mg/33ml Liqd (Misc natural products) .... As needed 6)  Aspirin 81 Mg Tabs (Aspirin) .... Take 1 by mouth once daily 7)  Alprazolam 0.25 Mg Tabs (Alprazolam) .... Take 1 by mouth every 8 hours as needed 8)  Diltiazem Hcl Cr 180 Mg Xr24h-cap (Diltiazem hcl) .Marland Kitchen.. 1 tab daily for fast heart beat  Patient Instructions: 1)  Please schedule a follow-up appointment in 1 month.  Prescriptions: DILTIAZEM HCL CR 180 MG XR24H-CAP (DILTIAZEM HCL) 1 tab daily for fast heart beat  #30 x 11   Entered and Authorized by:   Cindee Salt MD   Signed by:   Cindee Salt MD on 08/29/2009   Method used:   Electronically to        [facility] [phone]* (retail)       [address] [place], [place]  [address]       Ph: [phone]       Fax: [phone]   RxID:   [id number]   Current Allergies (reviewed today): ! ASA ! * HCTZ   EKG  Procedure date:  08/29/2009  Findings:      sinus bradycardia @45  LAE RBBB

## 2010-09-19 NOTE — Progress Notes (Signed)
Summary: refill request for ambien  Phone Note Refill Request Message from:  Fax from Pharmacy  Refills Requested: Medication #1:  ZOLPIDEM TARTRATE 10 MG TABS Take 1 tablet by mouth at bedtime   Last Refilled: 12/24/2009 Faxed request from [facility] is on your desk.  Initial call taken by: Lowella Petties CMA,  January 16, 2010 12:31 PM  Follow-up for Phone Call        okay #30 x 3 Follow-up by: Cindee Salt MD,  January 16, 2010 1:40 PM  Additional Follow-up for Phone Call Additional follow up Details #1::        Rx faxed to pharmacy Additional Follow-up by: DeShannon Smith CMA Duncan Dull),  January 16, 2010 3:16 PM    Prescriptions: ZOLPIDEM TARTRATE 10 MG TABS (ZOLPIDEM TARTRATE) Take 1 tablet by mouth at bedtime  #30 x 3   Entered by:   Mervin Hack CMA (AAMA)   Authorized by:   Cindee Salt MD   Signed by:   Mervin Hack CMA (AAMA) on 01/16/2010   Method used:   Handwritten   RxID:   [id number]

## 2010-09-19 NOTE — Progress Notes (Signed)
Summary:  ZOLPIDEM TARTRATE   Phone Note Refill Request Message from:  [facility] on August 26, 2010 3:54 PM  Refills Requested: Medication #1:  ZOLPIDEM TARTRATE 10 MG TABS Take 1 tablet by mouth at bedtime Form on your desk    Method Requested: Fax to Local Pharmacy Initial call taken by: Mervin Hack CMA Duncan Dull),  August 26, 2010 3:54 PM  Follow-up for Phone Call        okay #30 x 3 Follow-up by: Cindee Salt MD,  August 27, 2010 9:03 AM  Additional Follow-up for Phone Call Additional follow up Details #1::        Rx faxed to pharmacy Additional Follow-up by: DeShannon Smith CMA Duncan Dull),  August 27, 2010 9:33 AM    Prescriptions: ZOLPIDEM TARTRATE 10 MG TABS (ZOLPIDEM TARTRATE) Take 1 tablet by mouth at bedtime  #30 x 3   Entered by:   Mervin Hack CMA (AAMA)   Authorized by:   Cindee Salt MD   Signed by:   Mervin Hack CMA (AAMA) on 08/27/2010   Method used:   Handwritten   RxID:   [id number]

## 2010-09-19 NOTE — Assessment & Plan Note (Signed)
Summary: ROA FOR 4 MONTH FOLLOW-UP/JRR   Vital Signs:  Patient profile:   77 year old female Weight:      100 pounds Temp:     97.7 degrees F oral Pulse rate:   64 / minute Pulse rhythm:   regular BP sitting:   128 / 60  (left arm) Cuff size:   regular  Vitals Entered By: Mervin Hack CMA Duncan Dull) (February 04, 2010 12:23 PM) CC: 4 month follow-up   History of Present Illness: Doing well  Heart has been okay Has had some spells where it would seem irregular (a couple of months ago) has been steady lately still exercises regularly at [employer]----some decrease due to planning son's wedding tries to eat healthy  Still has problems with baldder---gets intermittent pain sig urgency and incontinence at times---has to wear pads hasn't been back to urologist in a while---doesn't like driving to [facility] Nocturia x1-3 in general  Anxiety has been okay always hyper person but no sig change no depression  Occ arthritic pain in hands stilff at times also nothing worrisome  Allergies: 1)  ! Asa 2)  ! * Hctz 3)  Diazepam (Diazepam)  Past History:  Past medical, surgical, family and social histories (including risk factors) reviewed for relevance to current acute and chronic problems.  Past Medical History: Reviewed history from 08/29/2009 and no changes required. Anxiety: Depression Hyperlipidemia Osteoarthritis Osteoporosis Interstitial cystitis Colonic polyps, hx of GERD Coronary artery disease Atrial fibrillation  Past Surgical History: Reviewed history from 08/29/2009 and no changes required. Colonoscopy:(1996) PTCA:(05/1998) Right CTS :(2003) Cardiolite stress negative:(10/2002) Echo-- LV EF- 60%:(10/2002) Cataract on right  ~2005 Chest pain/rapid atrial fib   12/10  Family History: Reviewed history from 12/17/2007 and no changes required. Dad had MI @62  Mom died @82  with CAD Brother with CABG Half sister died @62  of MI DM in 1 brother and pat uncle Mom  with HTN 1 sister with brain cancer sister with ovarian cancer Brother with CAD, bladder cancer found close to his death. Had rheumatic heart disease  Social History: Reviewed history from 12/17/2007 and no changes required. Married--1 child Former Smoker Alcohol use-no Retired Conservation officer, [employer]  Review of Systems       Lost 5#---relates to being busy planning wedding shower, etc sleeps okay--as long as she takes the [place] actually calms her bladder down also Bruises easy if she hits her arm, etc  Physical Exam  General:  alert and normal appearance.   Neck:  supple, no masses, no thyromegaly, and no cervical lymphadenopathy.  Referred aortic murmur in carotids Lungs:  normal respiratory effort and normal breath sounds.   Heart:  regular rhythm, no gallop, and bradycardia.   Gr 2/6 coarse aortic systolic murmur at base to carotids Abdomen:  soft and non-tender.   No CVA tenderness Msk:  no joint tenderness and no joint swelling.   Pulses:  2+ in feet Extremities:  No clubbing, cyanosis, edema, or deformity noted with normal full range of motion of all joints.   Skin:  no rashes and no suspicious lesions.   Psych:  normally interactive, good eye contact, not anxious appearing, and not depressed appearing.     Impression & Recommendations:  Problem # 1:  ATRIAL FIBRILLATION (ICD-427.31) Assessment Unchanged rate good still sounds like sinus now only occ symptoms  Her updated medication list for this problem includes:    Atenolol 25 Mg Tabs (Atenolol) .Marland Kitchen... Take 1 tablet by mouth once a day  Diltiazem Hcl Cr 180 Mg Xr24h-cap (Diltiazem hcl) .Marland Kitchen... 1/2  tab daily for fast heart beat    Aspirin 81 Mg Tabs (Aspirin) .Marland Kitchen... Take 1 by mouth once daily  Problem # 2:  CORONARY ARTERY DISEASE (ICD-414.00) Assessment: Unchanged  no symptoms no change in exercise tolerance  Her updated medication list for this problem includes:    Atenolol 25 Mg Tabs  (Atenolol) .Marland Kitchen... Take 1 tablet by mouth once a day    Diltiazem Hcl Cr 180 Mg Xr24h-cap (Diltiazem hcl) .Marland Kitchen... 1/2  tab daily for fast heart beat    Aspirin 81 Mg Tabs (Aspirin) .Marland Kitchen... Take 1 by mouth once daily  Labs Reviewed: Chol: 217 (04/17/2009)   HDL: 54.70 (04/17/2009)   LDL: DEL (12/17/2007)   TG: 93.0 (04/17/2009)  Problem # 3:  INTERSTITIAL CYSTITIS (ICD-595.1) Assessment: Unchanged ongoing problems plans to reevaluate at urologist  Problem # 4:  ANXIETY (ICD-300.00) Assessment: Unchanged doing well on current Rx  Her updated medication list for this problem includes:    Paxil 20 Mg Tabs (Paroxetine hcl) .Marland Kitchen... Take 1/2  tablet by mouth once a day    Alprazolam 0.25 Mg Tabs (Alprazolam) .Marland Kitchen... Take 1 by mouth every 8 hours as needed  Complete Medication List: 1)  Atenolol 25 Mg Tabs (Atenolol) .... Take 1 tablet by mouth once a day 2)  Zolpidem Tartrate 10 Mg Tabs (Zolpidem tartrate) .... Take 1 tablet by mouth at bedtime 3)  Paxil 20 Mg Tabs (Paroxetine hcl) .... Take 1/2  tablet by mouth once a day 4)  Alprazolam 0.25 Mg Tabs (Alprazolam) .... Take 1 by mouth every 8 hours as needed 5)  Diltiazem Hcl Cr 180 Mg Xr24h-cap (Diltiazem hcl) .... 1/2  tab daily for fast heart beat 6)  Glucosamine-chondroitin 500-400 Mg Caps (Glucosamine-chondroitin) .... Take 1 tablet by mouth once a day 7)  Calcium-vitamin D 250-125 Mg-unit Tabs (Calcium carbonate-vitamin d) .... Take 1 by mouth once daily 8)  Himalayan Goji 60 Mg/49ml Liqd (Misc natural products) .... As needed 9)  Aspirin 81 Mg Tabs (Aspirin) .... Take 1 by mouth once daily  Patient Instructions: 1)  Please schedule a follow-up appointment in 4 months .   Current Allergies (reviewed today): ! ASA ! * HCTZ DIAZEPAM (DIAZEPAM)

## 2010-09-20 NOTE — Letter (Signed)
Summary: [facility]   Imported By: Sherian Rein 09/07/2009 14:57:17  _____________________________________________________________________  External Attachment:    Type:   Image     Comment:   External Document  Appended Document: [facility] no change consider CABG if ischemic symptoms

## 2010-09-23 ENCOUNTER — Encounter: Payer: Self-pay | Admitting: Internal Medicine

## 2010-10-09 NOTE — Letter (Signed)
Summary: Self Health Assessment/[employer]  Self Health Assessment/[employer]   Imported By: Maryln Gottron 10/04/2010 13:27:14  _____________________________________________________________________  External Attachment:    Type:   Image     Comment:   External Document

## 2010-10-31 ENCOUNTER — Encounter: Payer: Self-pay | Admitting: Internal Medicine

## 2010-10-31 LAB — POCT I-STAT 4, (NA,K, GLUC, HGB,HCT)
Glucose, Bld: 91 mg/dL (ref 70–99)
HCT: 40 % (ref 36.0–46.0)

## 2010-11-26 ENCOUNTER — Encounter: Payer: Self-pay | Admitting: Internal Medicine

## 2010-11-26 ENCOUNTER — Ambulatory Visit (INDEPENDENT_AMBULATORY_CARE_PROVIDER_SITE_OTHER): Payer: MEDICARE | Admitting: Internal Medicine

## 2010-11-26 VITALS — BP 150/80 | HR 81 | Temp 98.7°F | Ht 61.0 in | Wt 101.0 lb

## 2010-11-26 DIAGNOSIS — Z23 Encounter for immunization: Secondary | ICD-10-CM

## 2010-11-26 DIAGNOSIS — L02519 Cutaneous abscess of unspecified hand: Secondary | ICD-10-CM

## 2010-11-26 DIAGNOSIS — W5501XA Bitten by cat, initial encounter: Secondary | ICD-10-CM

## 2010-11-26 DIAGNOSIS — IMO0001 Reserved for inherently not codable concepts without codable children: Secondary | ICD-10-CM

## 2010-11-26 DIAGNOSIS — L03119 Cellulitis of unspecified part of limb: Secondary | ICD-10-CM | POA: Insufficient documentation

## 2010-11-26 MED ORDER — AMOXICILLIN-POT CLAVULANATE 875-125 MG PO TABS: 1.0000 | ORAL_TABLET | Freq: Two times a day (BID) | ORAL | Status: AC

## 2010-11-26 NOTE — Progress Notes (Signed)
  Subjective:    Patient ID: Joan Hull, female    DOB: 02/22/1926, 77 y.o.   MRN: [id number]  HPI Got bit by neighbor's cat yesterday Outdoor cat Seemed friendly and she was petting---then it turned on her  Was a stray cat but he has had it for 2 weeks No sig behavioral issues otherwise  Sore last night Soaked it in warm water and tried iodine, etc Now red this AM Arm feels hot  Past Medical History  Diagnosis Date  . Anxiety   . Depression   . Hyperlipidemia   . Arthritis   . Osteoporosis   . GERD (gastroesophageal reflux disease)   . CAD (coronary artery disease)   . Interstitial cystitis   . Hx of colonic polyps   . Atrial fibrillation     Past Surgical History  Procedure Date  . Cataract extraction 2005    Family History  Problem Relation Age of Onset  . Heart disease Mother     cad  . Hypertension Mother   . Heart disease Father     heart attack  . Heart disease Sister     half sister MI  . Heart disease Brother     cabg  . Heart disease Brother   . Cancer Brother     BLADDER  . Cancer Sister     BRAIN  . Cancer Sister     OVARIAN    History   Social History  . Marital Status: Widowed    Spouse Name: N/A    Number of Children: 1  . Years of Education: N/A   Occupational History  . RETIRED ([employer])    Social History Main Topics  . Smoking status: Former Games developer  . Smokeless tobacco: Not on file  . Alcohol Use: No  . Drug Use: Not on file  . Sexually Active: Not on file   Other Topics Concern  . Not on file   Social History Narrative  . No narrative on file   Review of Systems No fever No vomiting or diarrhea Has started in investigational study of ticagrelor through Dr Darrold Junker    Objective:   Physical Exam  Constitutional: She appears well-developed and well-nourished. No distress.  Skin:             Assessment & Plan:

## 2010-11-26 NOTE — Assessment & Plan Note (Addendum)
From cat bite Will treat with augmentin Will have her check with animal control---cat may need to be quarantined Will update Td

## 2010-11-26 NOTE — Patient Instructions (Addendum)
Please check with animal control to see if the cat needs to be quarantined Please call for recheck tomorrow if it is any worse

## 2010-12-16 ENCOUNTER — Encounter: Payer: Self-pay | Admitting: Internal Medicine

## 2010-12-16 ENCOUNTER — Ambulatory Visit (INDEPENDENT_AMBULATORY_CARE_PROVIDER_SITE_OTHER): Payer: MEDICARE | Admitting: Internal Medicine

## 2010-12-16 ENCOUNTER — Ambulatory Visit: Payer: Self-pay | Admitting: Internal Medicine

## 2010-12-16 VITALS — BP 132/70 | HR 56 | Temp 98.3°F | Ht 61.0 in | Wt 100.0 lb

## 2010-12-16 DIAGNOSIS — I4891 Unspecified atrial fibrillation: Secondary | ICD-10-CM

## 2010-12-16 DIAGNOSIS — M1990 Unspecified osteoarthritis, unspecified site: Secondary | ICD-10-CM

## 2010-12-16 DIAGNOSIS — K5909 Other constipation: Secondary | ICD-10-CM

## 2010-12-16 DIAGNOSIS — F411 Generalized anxiety disorder: Secondary | ICD-10-CM

## 2010-12-16 DIAGNOSIS — N3010 Interstitial cystitis (chronic) without hematuria: Secondary | ICD-10-CM

## 2010-12-16 DIAGNOSIS — I2510 Atherosclerotic heart disease of native coronary artery without angina pectoris: Secondary | ICD-10-CM

## 2010-12-16 MED ORDER — PAROXETINE HCL 20 MG PO TABS: 10.0000 mg | ORAL_TABLET | ORAL | Status: DC

## 2010-12-16 MED ORDER — ZOLPIDEM TARTRATE 10 MG PO TABS: 10.0000 mg | ORAL_TABLET | Freq: Every evening | ORAL | Status: DC | PRN

## 2010-12-16 NOTE — Patient Instructions (Signed)
Please call Dr Darrold Junker to let him know about the nosebleed Please try senekot-S 2 tabs once or twice daily to see if that helps your bowels

## 2010-12-16 NOTE — Progress Notes (Signed)
Subjective:    Patient ID: Joan Hull, female    DOB: 01/09/1926, 77 y.o.   MRN: [id number]  HPI Was having chest pain Saw Dr Haskel Schroeder  stress test. Some concern for blockages "at the bottom of my heart" Decided against cath though Has had some increase in the fluttering---diltiazem was doubled This would cause dizziness Atenolol was stopped before due to bradycardia Now better on the increased med. Chest pain gone and fluttering better  Had nose bleed yesterday--packed nose with cotton Skipped aspirin yesterday and today Had to skip church though Has been on study of ticagrelor (sp)----told her she needs to discuss the nose bleed with Dr Darrold Junker  Has stopped exercising due to exertional chest pain Hasn't restarted --discussed due this in a graded fashion  Feels the osteo biflex keeps her from having joint pain  Still having problems with bowels Couldn't tolerate the miralax Ongoing bladder spasms also  Mood has been okay Anxiety controlled with the paroxetine  Current outpatient prescriptions:aspirin 81 MG tablet, Take 81 mg by mouth daily.  , Disp: , Rfl: ;  calcium-vitamin D (OSCAL WITH D 250-125) 250-125 MG-UNIT per tablet, Take 1 tablet by mouth daily.  , Disp: , Rfl: ;  diltiazem (DILACOR XR) 240 MG 24 hr capsule, Take 1 tablet by mouth once daily, Disp: , Rfl: ;  glucosamine-chondroitin 500-400 MG tablet, Take 1 tablet by mouth daily.  , Disp: , Rfl:  hydrochlorothiazide 25 MG tablet, Take 1 by mouth once daily, Disp: , Rfl: ;  Misc Natural Products (HIMALAYAN GOJI) 60 MG/30ML LIQD, Take by mouth as needed.  , Disp: , Rfl: ;  PARoxetine (PAXIL) 20 MG tablet, Take 10 mg by mouth every morning.  , Disp: , Rfl: ;  senna-docusate (SENNA-S) 8.6-50 MG per tablet, Take 2 tablets by mouth daily. For constipation Increase to twice a day if needed , Disp: , Rfl:  zolpidem (AMBIEN) 10 MG tablet, Take 10 mg by mouth at bedtime as needed.  , Disp: , Rfl: ;  DISCONTD:  POLYETHYLENE GLYCOL 3350 PO, Take by mouth. 1 Capful daily mixed with water to prevent constipation , Disp: , Rfl: ;  DISCONTD: diltiazem (CARDIZEM CD) 180 MG 24 hr capsule, Take 180 mg by mouth daily.  , Disp: , Rfl:   Past Medical History  Diagnosis Date  . Anxiety   . Depression   . Hyperlipidemia   . Arthritis   . Osteoporosis   . GERD (gastroesophageal reflux disease)   . CAD (coronary artery disease)   . Interstitial cystitis   . Hx of colonic polyps   . Atrial fibrillation     Past Surgical History  Procedure Date  . Cataract extraction 2005    Family History  Problem Relation Age of Onset  . Heart disease Mother     cad  . Hypertension Mother   . Heart disease Father     heart attack  . Heart disease Sister     half sister MI  . Heart disease Brother     cabg  . Heart disease Brother   . Cancer Brother     BLADDER  . Cancer Sister     BRAIN  . Cancer Sister     OVARIAN    History   Social History  . Marital Status: Widowed    Spouse Name: N/A    Number of Children: 1  . Years of Education: N/A   Occupational History  . RETIRED [employer])  Social History Main Topics  . Smoking status: Former Games developer  . Smokeless tobacco: Not on file  . Alcohol Use: No  . Drug Use: Not on file  . Sexually Active: Not on file   Other Topics Concern  . Not on file   Social History Narrative  . No narrative on file   Review of Systems Appetite is okay Weight is stable Sleeps well but still needs the ambien    Objective:   Physical Exam  Constitutional: She appears well-developed and well-nourished. No distress.  Neck: Normal range of motion. No thyromegaly present.  Cardiovascular: Normal rate, regular rhythm, normal heart sounds and intact distal pulses.  Exam reveals no gallop.   No murmur heard. Pulmonary/Chest: Effort normal and breath sounds normal. No respiratory distress. She has no wheezes. She has no rales.  Abdominal: Soft.  She exhibits no mass. There is no tenderness.  Musculoskeletal: Normal range of motion. She exhibits no edema and no tenderness.  Lymphadenopathy:    She has no cervical adenopathy.  Psychiatric: Her behavior is normal. Judgment and thought content normal.       Mildly anxious          Assessment & Plan:

## 2010-12-20 ENCOUNTER — Encounter: Payer: Self-pay | Admitting: Internal Medicine

## 2011-01-03 NOTE — Op Note (Signed)
NAMEKECIA, SWOBODA            ACCOUNT NO.:  [id number]   MEDICAL RECORD NO.:  [id number]          PATIENT TYPE:  AMB   LOCATION:  [facility]                         FACILITY:  [facility]   PHYSICIAN:  Sigmund I. Patsi Sears, M.D.DATE OF BIRTH:  Nov 18, 1925   DATE OF PROCEDURE:  07/06/2006  DATE OF DISCHARGE:                                 OPERATIVE REPORT   PREOPERATIVE DIAGNOSIS:  Interstitial cystitis with small capacity bladder.   POSTOPERATIVE DIAGNOSIS:  Interstitial cystitis with small capacity bladder.   OPERATION:  1. Cystourethroscopy.  2. Hydrodistention of the bladder (325 mL).  3. Instillation of Marcaine Kenalog.  4. Instillation of Marcaine Pyridium and injection of Marcaine Kenalog in      the subtrigonal space.   SURGEON:  Tannenbaum.   ANESTHESIA:  General LMA.   PREPARATION:  After appropriate preanesthesia, the patient was brought to  the operating room, placed on the operating table in the dorsal supine  position where general LMA anesthesia was introduced.  She was then replaced  in the dorsal lithotomy position where the pubis was prepped with  Betadine  solution and draped in the usual fashion.   PROCEDURE:  Cystoscopy was accomplished and showed a normal appearing  bladder trigone, but very small bladder capacity.  She was hydrodistended to  325 mL, with photo documentation of marked glomerulations, and bleeding  within the bladder.  Pyridium Marcaine was inserted in the bladder, and  Marcaine Kenalog was injected in the subtrigonal space.  The patient was  then awakened and taken to the recovery room in good condition.      Sigmund I. Patsi Sears, M.D.  Electronically Signed     SIT/MEDQ  D:  07/06/2006  T:  07/06/2006  Job:  [id number]

## 2011-02-16 HISTORY — PX: CORONARY STENT PLACEMENT: SHX1402

## 2011-03-17 ENCOUNTER — Inpatient Hospital Stay: Payer: PPO | Admitting: Internal Medicine

## 2011-03-24 ENCOUNTER — Telehealth: Payer: Self-pay | Admitting: *Deleted

## 2011-03-25 ENCOUNTER — Ambulatory Visit (INDEPENDENT_AMBULATORY_CARE_PROVIDER_SITE_OTHER): Payer: Medicare Other | Admitting: Internal Medicine

## 2011-03-25 ENCOUNTER — Encounter: Payer: Self-pay | Admitting: Internal Medicine

## 2011-03-25 DIAGNOSIS — I2510 Atherosclerotic heart disease of native coronary artery without angina pectoris: Secondary | ICD-10-CM

## 2011-03-25 DIAGNOSIS — I4891 Unspecified atrial fibrillation: Secondary | ICD-10-CM

## 2011-03-25 DIAGNOSIS — R252 Cramp and spasm: Secondary | ICD-10-CM | POA: Insufficient documentation

## 2011-03-25 DIAGNOSIS — E785 Hyperlipidemia, unspecified: Secondary | ICD-10-CM

## 2011-03-25 MED ORDER — METHOCARBAMOL 500 MG PO TABS: 500.0000 mg | ORAL_TABLET | Freq: Every evening | ORAL | Status: AC | PRN

## 2011-03-25 NOTE — Assessment & Plan Note (Signed)
Just had NonQ MI in setting of rapid atrial fib and DES to RCA by Dr Darrold Junker On plavix---discussed that this explains her bruising

## 2011-03-25 NOTE — Assessment & Plan Note (Signed)
Started on statin which is appropriate but very sensitive to meds Will have her cut in half for now If leg cramps persist, may need to stop

## 2011-03-25 NOTE — Assessment & Plan Note (Signed)
Since in hospital Could be related to the statin Had been put on study med vs placebo---but she stopped that Will check lytes Brief trial with methocarbamol

## 2011-03-25 NOTE — Patient Instructions (Signed)
Please cut the cholesterol med, atorvastatin, in half and only take 1/2 daily (20mg ). If the leg pains continue, we will have to try without this medicine

## 2011-03-25 NOTE — Assessment & Plan Note (Signed)
Regular rythym again On lower cardizem now but lisinopril added

## 2011-03-25 NOTE — Progress Notes (Signed)
Subjective:    Patient ID: Joan Hull, female    DOB: 1926-04-26, 77 y.o.   MRN: [id number]  HPI Had to go to ER 1 week ago Seen at [facility] and had blocked artery Had angioplasty again to RCA Rapid atrial fib at that time as well Dr Evette Georges takes care of her  Having some muscle spasms at night Keeping her up Notes frequent nocturia as well--this is new  No chest pain since home Slight fluttering---no fast heart Breathing is okay--occ DOE  Current Outpatient Prescriptions on File Prior to Visit  Medication Sig Dispense Refill  . aspirin 81 MG tablet Take 81 mg by mouth daily.        . calcium-vitamin D (OSCAL WITH D 250-125) 250-125 MG-UNIT per tablet Take 1 tablet by mouth daily.        Marland Kitchen glucosamine-chondroitin 500-400 MG tablet Take 1 tablet by mouth daily.        . hydrochlorothiazide 25 MG tablet Take 1 by mouth once daily      . Misc Natural Products (HIMALAYAN GOJI) 60 MG/30ML LIQD Take by mouth as needed.        Marland Kitchen PARoxetine (PAXIL) 20 MG tablet Take 0.5 tablets (10 mg total) by mouth every morning.  15 tablet  11  . senna-docusate (SENNA-S) 8.6-50 MG per tablet Take 2 tablets by mouth daily. For constipation Increase to twice a day if needed       . zolpidem (AMBIEN) 10 MG tablet Take 1 tablet (10 mg total) by mouth at bedtime as needed.  30 tablet  3    Allergies  Allergen Reactions  . Aspirin   . Diazepam     REACTION: worsened bladder problems    Past Medical History  Diagnosis Date  . Anxiety   . Depression   . Hyperlipidemia   . Arthritis   . Osteoporosis   . GERD (gastroesophageal reflux disease)   . CAD (coronary artery disease)   . Interstitial cystitis   . Hx of colonic polyps   . Atrial fibrillation     Past Surgical History  Procedure Date  . Cataract extraction 2005  . Coronary stent placement 7/12    RCA with drug eluting stent---[name]    Family History  Problem Relation Age of Onset  . Heart disease Mother     cad  .  Hypertension Mother   . Heart disease Father     heart attack  . Heart disease Sister     half sister MI  . Heart disease Brother     cabg  . Heart disease Brother   . Cancer Brother     BLADDER  . Cancer Sister     BRAIN  . Cancer Sister     OVARIAN    History   Social History  . Marital Status: Widowed    Spouse Name: N/A    Number of Children: 1  . Years of Education: N/A   Occupational History  . RETIRED ([employer])    Social History Main Topics  . Smoking status: Former Games developer  . Smokeless tobacco: Never Used  . Alcohol Use: No  . Drug Use: Not on file  . Sexually Active: Not on file   Other Topics Concern  . Not on file   Social History Narrative  . No narrative on file    Review of Systems Is able to sleep with ambien--still awakens due to cramps Appetite is off since in  hospital    Objective:   Physical Exam  Constitutional: She appears well-developed and well-nourished. No distress.  Neck: Normal range of motion. Neck supple.  Cardiovascular: Normal rate and regular rhythm.  Exam reveals no gallop.   Murmur heard.      Gr 2/6 systolic murmur at base  Pulmonary/Chest: Effort normal and breath sounds normal. No respiratory distress. She has no wheezes. She has no rales.  Abdominal: Soft. There is no tenderness.  Musculoskeletal: She exhibits no edema and no tenderness.  Lymphadenopathy:    She has no cervical adenopathy.  Psychiatric: She has a normal mood and affect. Her behavior is normal.          Assessment & Plan:

## 2011-03-26 LAB — BASIC METABOLIC PANEL
Calcium: 9.1 mg/dL (ref 8.4–10.5)
Creatinine, Ser: 1 mg/dL (ref 0.4–1.2)
GFR: 57.61 mL/min — ABNORMAL LOW (ref >60.00)
Glucose, Bld: 105 mg/dL — ABNORMAL HIGH (ref 70–99)
Sodium: 138 mEq/L (ref 135–145)

## 2011-03-27 ENCOUNTER — Telehealth: Payer: Self-pay | Admitting: *Deleted

## 2011-03-27 NOTE — Telephone Encounter (Signed)
.  left message to have patient return my call.  

## 2011-03-27 NOTE — Telephone Encounter (Signed)
Message copied by Sueanne Margarita on Thu Mar 27, 2011 11:05 AM ------      Message from: Tillman Abide I      Created: Wed Mar 26, 2011  8:38 PM       Please call      Kidney tests and potassium are all normal      Very reassuring      Copy to Dr Darrold Junker

## 2011-03-28 NOTE — Telephone Encounter (Signed)
.  left message to have patient return my call.  

## 2011-03-28 NOTE — Telephone Encounter (Signed)
It was too soon to check the cholesterol levels, esp since I asked her to decrease the dose. I am planning to check it the next time

## 2011-03-28 NOTE — Telephone Encounter (Signed)
Spoke with patient and advised results, she agreed.

## 2011-03-28 NOTE — Telephone Encounter (Signed)
Spoke with patient and advised results. Patient was asking why her cholesterol wasn't checked? Per pt she had a heart attack and probably needed it checked. Please advise

## 2011-05-19 ENCOUNTER — Telehealth: Payer: Self-pay | Admitting: *Deleted

## 2011-05-19 MED ORDER — LISINOPRIL 2.5 MG PO TABS: 2.5000 mg | ORAL_TABLET | Freq: Every day | ORAL | Status: DC

## 2011-05-19 MED ORDER — DILTIAZEM HCL ER COATED BEADS 180 MG PO CP24: 180.0000 mg | ORAL_CAPSULE | Freq: Every day | ORAL | Status: DC

## 2011-05-19 MED ORDER — CLOPIDOGREL BISULFATE 75 MG PO TABS: 75.0000 mg | ORAL_TABLET | Freq: Every day | ORAL | Status: DC

## 2011-05-19 NOTE — Telephone Encounter (Signed)
Received faxed refill request for EC Aspirin 325mg , one tablet by mouth daily.  This is not on med list, Aspirin 81mg , non EC is on med list.  Please advise, form in your IN box.

## 2011-05-19 NOTE — Telephone Encounter (Signed)
She should be on 81mg  daily since she is on plavix Please call her to check on this and change the Rx

## 2011-05-20 NOTE — Telephone Encounter (Signed)
Spoke with patient and she stated Dr. Leodis Sias? Put her on the 325mg  when she was in the hospital, she will call their office to get it straight, also pt states the refill should have went to him.

## 2011-06-13 ENCOUNTER — Other Ambulatory Visit: Payer: Self-pay | Admitting: *Deleted

## 2011-06-13 MED ORDER — ZOLPIDEM TARTRATE 10 MG PO TABS: 10.0000 mg | ORAL_TABLET | Freq: Every evening | ORAL | Status: DC | PRN

## 2011-06-13 NOTE — Telephone Encounter (Signed)
Okay #30 x 3 

## 2011-06-13 NOTE — Telephone Encounter (Signed)
rx called into pharmacy

## 2011-06-27 ENCOUNTER — Inpatient Hospital Stay: Payer: PPO | Admitting: Student

## 2011-06-27 DIAGNOSIS — I214 Non-ST elevation (NSTEMI) myocardial infarction: Secondary | ICD-10-CM

## 2011-06-27 HISTORY — DX: Non-ST elevation (NSTEMI) myocardial infarction: I21.4

## 2011-07-01 ENCOUNTER — Encounter: Payer: Self-pay | Admitting: Internal Medicine

## 2011-08-19 ENCOUNTER — Inpatient Hospital Stay: Payer: Self-pay | Admitting: Internal Medicine

## 2011-08-19 HISTORY — PX: CORONARY ARTERY BYPASS GRAFT: SHX141

## 2011-08-19 LAB — TROPONIN I: Troponin-I: 1.3 ng/mL — ABNORMAL HIGH

## 2011-08-19 LAB — COMPREHENSIVE METABOLIC PANEL
Albumin: 3.6 g/dL (ref 3.4–5.0)
Anion Gap: 11 (ref 7–16)
BUN: 23 mg/dL — ABNORMAL HIGH (ref 7–18)
Calcium, Total: 9.3 mg/dL (ref 8.5–10.1)
Chloride: 105 mmol/L (ref 98–107)
EGFR (African American): >60
Glucose: 145 mg/dL — ABNORMAL HIGH (ref 65–99)
SGOT(AST): 24 U/L (ref 15–37)
SGPT (ALT): 19 U/L
Total Protein: 6.8 g/dL (ref 6.4–8.2)

## 2011-08-19 LAB — CBC
HCT: 38.7 % (ref 35.0–47.0)
MCH: 31.6 pg (ref 26.0–34.0)
Platelet: 280 10*3/uL (ref 150–440)
RDW: 13.2 % (ref 11.5–14.5)
WBC: 5.5 10*3/uL (ref 3.6–11.0)

## 2011-08-19 LAB — CK TOTAL AND CKMB (NOT AT ARMC)
CK, Total: 76 U/L (ref 21–215)
CK-MB: 7.3 ng/mL — ABNORMAL HIGH (ref 0.5–3.6)

## 2011-08-20 LAB — CK TOTAL AND CKMB (NOT AT ARMC)
CK, Total: 78 U/L (ref 21–215)
CK-MB: 7.9 ng/mL — ABNORMAL HIGH (ref 0.5–3.6)
CK-MB: 6.7 ng/mL — ABNORMAL HIGH (ref 0.5–3.6)

## 2011-08-20 LAB — TROPONIN I
Troponin-I: 2.03 ng/mL — ABNORMAL HIGH
Troponin-I: 1.8 ng/mL — ABNORMAL HIGH

## 2011-08-25 ENCOUNTER — Telehealth: Payer: Self-pay | Admitting: Internal Medicine

## 2011-08-25 NOTE — Telephone Encounter (Signed)
Got records from [facility] of last week's visit Had another non Q MI Cath showed 4 vessel disease apparently  Due to CABG at [facility] next week  Discussed this with her

## 2011-09-23 ENCOUNTER — Encounter: Payer: Self-pay | Admitting: Internal Medicine

## 2011-09-25 LAB — PROTIME-INR: Prothrombin Time: 24.6 secs — ABNORMAL HIGH (ref 11.5–14.7)

## 2011-09-25 LAB — BASIC METABOLIC PANEL
Anion Gap: 13 (ref 7–16)
Calcium, Total: 8.6 mg/dL (ref 8.5–10.1)
Chloride: 102 mmol/L (ref 98–107)
Co2: 24 mmol/L (ref 21–32)
Creatinine: 1.1 mg/dL (ref 0.60–1.30)
EGFR (Non-African Amer.): 50 — ABNORMAL LOW
Glucose: 158 mg/dL — ABNORMAL HIGH (ref 65–99)
Osmolality: 283 (ref 275–301)

## 2011-09-25 LAB — VANCOMYCIN, TROUGH: Vancomycin, Trough: 8 ug/mL — ABNORMAL LOW (ref 10–20)

## 2011-09-26 LAB — URINALYSIS, COMPLETE
RBC,UR: 1810 /HPF (ref 0–5)
Squamous Epithelial: 1

## 2011-09-27 LAB — VANCOMYCIN, TROUGH: Vancomycin, Trough: 16 ug/mL (ref 10–20)

## 2011-09-27 LAB — URINE CULTURE

## 2011-09-29 LAB — PROTIME-INR: INR: 1.6

## 2011-10-07 LAB — PROTIME-INR
INR: 1.9
Prothrombin Time: 21.7 secs — ABNORMAL HIGH (ref 11.5–14.7)

## 2011-10-09 LAB — PROTIME-INR: INR: 1.6

## 2011-10-10 ENCOUNTER — Telehealth: Payer: Self-pay | Admitting: *Deleted

## 2011-10-10 NOTE — Telephone Encounter (Signed)
That is fine 

## 2011-10-10 NOTE — Telephone Encounter (Signed)
Patient is going home from [place] and [facility] is asking for verbal ok for home health, PT/OT. Please advise

## 2011-10-10 NOTE — Telephone Encounter (Signed)
Spoke with nurse and advised results  

## 2011-10-17 ENCOUNTER — Encounter: Payer: Self-pay | Admitting: Internal Medicine

## 2011-10-20 ENCOUNTER — Encounter: Payer: Self-pay | Admitting: Internal Medicine

## 2011-10-20 ENCOUNTER — Ambulatory Visit (INDEPENDENT_AMBULATORY_CARE_PROVIDER_SITE_OTHER): Payer: Medicare Other | Admitting: Internal Medicine

## 2011-10-20 VITALS — BP 110/60 | HR 67 | Temp 97.8°F | Ht 61.0 in | Wt 98.0 lb

## 2011-10-20 DIAGNOSIS — E785 Hyperlipidemia, unspecified: Secondary | ICD-10-CM

## 2011-10-20 DIAGNOSIS — F411 Generalized anxiety disorder: Secondary | ICD-10-CM

## 2011-10-20 DIAGNOSIS — I4891 Unspecified atrial fibrillation: Secondary | ICD-10-CM

## 2011-10-20 DIAGNOSIS — I2510 Atherosclerotic heart disease of native coronary artery without angina pectoris: Secondary | ICD-10-CM

## 2011-10-20 NOTE — Assessment & Plan Note (Signed)
Is on the paroxetine Seems to be reasonably controlled Still uses zolpidem for sleep---dependent

## 2011-10-20 NOTE — Assessment & Plan Note (Signed)
Now on statin Has noted some muscle symptoms but non specific at this point Will check labs No change for now

## 2011-10-20 NOTE — Patient Instructions (Signed)
Please decrease furosemide and potassium to once a day in the morning

## 2011-10-20 NOTE — Assessment & Plan Note (Signed)
Seems regular On carvedilol Coumadin---[name] monitors

## 2011-10-20 NOTE — Assessment & Plan Note (Signed)
Now with CABG Seems to be doing well Will decrease lasix as no edema now

## 2011-10-20 NOTE — Progress Notes (Signed)
Subjective:    Patient ID: Joan Hull, female    DOB: 10/20/1925, 78 y.o.   MRN: [id number]  HPI Had another MI and then went to [facility] for CABG--due to 3 vessel disease Persistent seepage from sternal wound---on antibiotics for a while Doesn't notice any memory changes---after the initial apparent delerium She doesn't think she needed blood transfusion  [place] for rehab for about 2 weeks Getting rehab at home Walking with walker at first---she feels her balance is better so not using now  Still some sternal pain---not angina (has used nitro once since the surgery but thinks it may have been indigestion) Has soreness on right breast at times---tylenol helps  Breathing is good Occ DOE---like if she goes out some. Better with rest Mild ankle edema Some redness at right calf vein harvest site  Will have rare palpitation Very brief Protimes being done at [facility] as in the past  Some muscle and joint aches Will need to watch since now on statin  Current Outpatient Prescriptions on File Prior to Visit  Medication Sig Dispense Refill  . aspirin 81 MG tablet Take 81 mg by mouth daily.        Marland Kitchen PARoxetine (PAXIL) 20 MG tablet Take 0.5 tablets (10 mg total) by mouth every morning.  15 tablet  11    Allergies  Allergen Reactions  . Aspirin   . Diazepam     REACTION: worsened bladder problems    Past Medical History  Diagnosis Date  . Anxiety   . Depression   . Hyperlipidemia   . Arthritis   . Osteoporosis   . GERD (gastroesophageal reflux disease)   . CAD (coronary artery disease)   . Interstitial cystitis   . Hx of colonic polyps   . Atrial fibrillation   . Non Q wave myocardial infarction 06/27/11    [facility]    Past Surgical History  Procedure Date  . Cataract extraction 2005  . Coronary stent placement 7/12    RCA with drug eluting stent---[name]  . Coronary artery bypass graft 1/13    [facility]    Family History  Problem Relation Age of Onset  . Heart  disease Mother     cad  . Hypertension Mother   . Heart disease Father     heart attack  . Heart disease Sister     half sister MI  . Heart disease Brother     cabg  . Heart disease Brother   . Cancer Brother     BLADDER  . Cancer Sister     BRAIN  . Cancer Sister     OVARIAN    History   Social History  . Marital Status: Widowed    Spouse Name: N/A    Number of Children: 1  . Years of Education: N/A   Occupational History  . RETIRED ([employer])    Social History Main Topics  . Smoking status: Former Games developer  . Smokeless tobacco: Never Used  . Alcohol Use: No  . Drug Use: Not on file  . Sexually Active: Not on file   Other Topics Concern  . Not on file   Social History Narrative  . No narrative on file   Review of Systems Trazodone didn't help her sleep Is back to the zolpidem Appetite is good    Objective:   Physical Exam  Constitutional: She appears well-developed and well-nourished. No distress.  Neck: Normal range of motion. Neck supple.  Cardiovascular: Normal  rate, regular rhythm and normal heart sounds.  Exam reveals no gallop.   No murmur heard. Pulmonary/Chest: Effort normal and breath sounds normal. No respiratory distress. She has no wheezes. She has no rales.       Sternal scar well healed---couple of areas of mature scab and healing erythema  Abdominal: Soft. There is no tenderness.  Musculoskeletal: She exhibits no edema.  Lymphadenopathy:    She has no cervical adenopathy.  Skin: No rash noted.       Right calf vein harvest site has clean dry eschar with slight surrounding erythema (doesn't look infected)  Psychiatric: She has a normal mood and affect. Her behavior is normal.          Assessment & Plan:

## 2011-10-21 LAB — CBC WITH DIFFERENTIAL/PLATELET
Basophils Relative: 0.6 % (ref 0.0–3.0)
Eosinophils Relative: 6.4 % — ABNORMAL HIGH (ref 0.0–5.0)
HCT: 31 % — ABNORMAL LOW (ref 36.0–46.0)
Hemoglobin: 10.5 g/dL — ABNORMAL LOW (ref 12.0–15.0)
Lymphs Abs: 1.7 10*3/uL (ref 0.7–4.0)
MCV: 91.6 fl (ref 78.0–100.0)
Monocytes Relative: 13.8 % — ABNORMAL HIGH (ref 3.0–12.0)
Neutro Abs: 4.7 10*3/uL (ref 1.4–7.7)
Platelets: 306 10*3/uL (ref 150.0–400.0)
RBC: 3.38 Mil/uL — ABNORMAL LOW (ref 3.87–5.11)
WBC: 8.1 10*3/uL (ref 4.5–10.5)

## 2011-10-21 LAB — LDL CHOLESTEROL, DIRECT: Direct LDL: 113.2 mg/dL

## 2011-10-21 LAB — BASIC METABOLIC PANEL
Chloride: 100 mEq/L (ref 96–112)
GFR: 55.56 mL/min — ABNORMAL LOW (ref >60.00)
Potassium: 5.4 mEq/L — ABNORMAL HIGH (ref 3.5–5.1)
Sodium: 132 mEq/L — ABNORMAL LOW (ref 135–145)

## 2011-10-21 LAB — HEPATIC FUNCTION PANEL
ALT: 17 U/L (ref 0–35)
AST: 19 U/L (ref 0–37)
Total Bilirubin: 0.2 mg/dL — ABNORMAL LOW (ref 0.3–1.2)
Total Protein: 6.9 g/dL (ref 6.0–8.3)

## 2011-10-21 LAB — LIPID PANEL
HDL: 50.3 mg/dL (ref >39.00)
Total CHOL/HDL Ratio: 4
VLDL: 40.8 mg/dL — ABNORMAL HIGH (ref 0.0–40.0)

## 2011-10-22 ENCOUNTER — Ambulatory Visit: Payer: Medicare Other | Admitting: Internal Medicine

## 2011-10-27 ENCOUNTER — Encounter: Payer: Self-pay | Admitting: *Deleted

## 2011-10-30 ENCOUNTER — Telehealth: Payer: Self-pay | Admitting: Internal Medicine

## 2011-10-30 NOTE — Telephone Encounter (Signed)
I agree with your suggestions to go the ER and am sorry that she is being reluctant to follow your advise.  I have never seen this pt, but  I am very uncomfortable with not only waiting until tomorrow afternoon but also treating her in office for probable a fib with RVR with possible acute MI in a CAD pt with recent CABG/MI.  Please call her cardiologist office to alert them of her symptoms and what has occurred.  Thank you.

## 2011-10-30 NOTE — Telephone Encounter (Signed)
Triage Record Num: [id number] Operator: Remonia Richter Patient Name: Joan Hull Call Date & Time: 10/29/2011 9:26:53PM Patient Phone: [phone] PCP: Tillman Abide Patient Gender: Female PCP Fax : [phone] Patient DOB: 23-Jul-1926 Practice Name: [facility] Reason for Call: Caller: [name]/Patient; PCP: Tillman Abide I.; CB#: [phone]; Call regarding Recent bypass surgery (1/22), 129 heart rate; 126/84, she called the office today and told not to worry if it came down, but to go get checked if it stayed up,ER disposition obtained per Irreg heartrate Guideline, advised to go to ER per disposition,she is asking if she can take an Atenalol 25mg  Po she has at home to try it as she took in past for rapid heart rate,Dr Plotnikov called and agred to have her try one dose but should go to ER if not effective in 60-90 minutes, advise given to caller also told she may call 911 for worsening s/s Protocol(s) Used: Irregular Heartbeat Recommended Outcome per Protocol: See ED Immediately Reason for Outcome: Sudden onset of rapid pulse (greater than 120 beats/minute at rest) OR slow pulse (less than 50 beats per minute at rest) Care Advice: ~ Protect the patient from falling or other harm. ~ Another adult should drive. ~ Do not give the patient anything to eat or drink. ~ IMMEDIATE ACTION Write down provider's name. List or place the following in a bag for transport with the patient: current prescription and/or nonprescription medications; alternative treatments, therapies and medications; and street drugs. ~ ~ Tell provider if you are taking any erectile dysfunction medication such as Viagra, Levitra, or Cialis. 10/29/2011 9:46:26PM Page 1 of 1 [id number]

## 2011-10-30 NOTE — Telephone Encounter (Signed)
Called pt per Dr. Elmer Sow request.  No answer, left message on voice mail for her to call back tomorrow with update.

## 2011-10-30 NOTE — Telephone Encounter (Signed)
Patient called requesting appt for rapid heart rate and shortness of breath. Patient said she had recent CABG in January and has had 3 MI's since August along with history of A-Fib. I advised patient that she needed to be evaluated at the ER or call her cardiologist. She said she called cards and they haven't called her back. She was adamant about not going back to ER because "they are tired of seeing me up there". I advised that was the best place for her to be with the symptoms she's having. She was concerned about them closing before she could get there. I advised they never closed. Then she was concerned about it being more expensive after hours. I advised it would be the same cost at any time. She was still very reluctant to go. She requested to schedule an appt tomorrow. I tried to schedule an AM appt and she advised she didn't want to come in that early, so I scheduled her an afternoon appt with Dr. Dayton Martes per her request. I advised her again that she needed to be in the ER to hopefully avoid any further problems and if there was something going on like this tomorrow, that we would send to ER anyway. She said she would still try to talk with cards and if she was able to get in with them, she would cancel appt tomorrow and she would also consider the ER.

## 2011-10-30 NOTE — Telephone Encounter (Signed)
Thank you :)

## 2011-10-30 NOTE — Telephone Encounter (Signed)
Spoke with Andrey Campanile at Dr. Darrold Junker office. She said that the patient is coming in to see them at 8:45 in the morning. Cancelled appt here.

## 2011-10-31 ENCOUNTER — Ambulatory Visit: Payer: Medicare Other | Admitting: Family Medicine

## 2011-10-31 NOTE — Telephone Encounter (Signed)
Noted This was the best idea for her

## 2011-11-03 NOTE — Telephone Encounter (Signed)
Good to hear

## 2011-11-03 NOTE — Telephone Encounter (Signed)
Pt called back.  She didn't go to ER on Thursday but did see cardiologist on Friday who gave her a heart monitor to wear over the weekend.  She says she feels better now.

## 2011-11-06 DIAGNOSIS — I2510 Atherosclerotic heart disease of native coronary artery without angina pectoris: Secondary | ICD-10-CM

## 2011-11-06 DIAGNOSIS — I4891 Unspecified atrial fibrillation: Secondary | ICD-10-CM

## 2011-11-06 DIAGNOSIS — Z48812 Encounter for surgical aftercare following surgery on the circulatory system: Secondary | ICD-10-CM

## 2011-11-10 ENCOUNTER — Encounter: Payer: Self-pay | Admitting: Cardiology

## 2011-11-17 ENCOUNTER — Encounter: Payer: Self-pay | Admitting: Internal Medicine

## 2011-11-17 ENCOUNTER — Other Ambulatory Visit: Payer: Self-pay | Admitting: *Deleted

## 2011-11-17 ENCOUNTER — Encounter: Payer: Self-pay | Admitting: Cardiology

## 2011-11-17 MED ORDER — ZOLPIDEM TARTRATE 10 MG PO TABS: 10.0000 mg | ORAL_TABLET | Freq: Every evening | ORAL | Status: DC | PRN

## 2011-11-17 NOTE — Telephone Encounter (Signed)
Okay #30 x 1 

## 2011-11-17 NOTE — Telephone Encounter (Signed)
rx called into pharmacy

## 2011-11-17 NOTE — Telephone Encounter (Signed)
rx request for Zolpidem Tartrate 10mg , this was not on pt's med list from [facility], ok to fill? Last rx written was 06/13/11 for #30 x3. Please advise

## 2011-11-25 ENCOUNTER — Other Ambulatory Visit: Payer: Self-pay | Admitting: *Deleted

## 2011-11-25 ENCOUNTER — Ambulatory Visit (INDEPENDENT_AMBULATORY_CARE_PROVIDER_SITE_OTHER): Payer: Medicare Other | Admitting: Internal Medicine

## 2011-11-25 ENCOUNTER — Ambulatory Visit (INDEPENDENT_AMBULATORY_CARE_PROVIDER_SITE_OTHER)
Admission: RE | Admit: 2011-11-25 | Discharge: 2011-11-25 | Disposition: A | Discharge status: Home / Self Care | Payer: Medicare Other | Source: Ambulatory Visit | Attending: Internal Medicine | Admitting: Internal Medicine

## 2011-11-25 ENCOUNTER — Telehealth: Payer: Self-pay | Admitting: Internal Medicine

## 2011-11-25 ENCOUNTER — Encounter: Payer: Self-pay | Admitting: Internal Medicine

## 2011-11-25 VITALS — BP 130/72 | HR 100 | Temp 97.8°F | Wt 103.0 lb

## 2011-11-25 DIAGNOSIS — R0989 Other specified symptoms and signs involving the circulatory and respiratory systems: Secondary | ICD-10-CM

## 2011-11-25 DIAGNOSIS — R0609 Other forms of dyspnea: Secondary | ICD-10-CM | POA: Insufficient documentation

## 2011-11-25 NOTE — Telephone Encounter (Signed)
Patient was prescribed these medications in the hospital, ok to fill?

## 2011-11-25 NOTE — Telephone Encounter (Signed)
Call a nurse calling stating that pt doesn't want to see her cardiologist, would like to see Dr. Alphonsus Sias for SOB, call-a nurse didn't think it was urgent because it has been going on for a while, pt doesn't think her cardiologist is taking her condition seriously enough, pt has been having sob since her surgery only when she get up and move around. appt scheduled for tomorrow by call-a-nurse. Please advise if pt should be seen today?

## 2011-11-25 NOTE — Telephone Encounter (Signed)
Patient coming today at 215

## 2011-11-25 NOTE — Telephone Encounter (Signed)
Please change the appt to today

## 2011-11-25 NOTE — Assessment & Plan Note (Addendum)
Does okay at rest Feels well except for the limitation in activities Has been doing the cardiac rehab  CXR shows left pleural effusion but this would not be enough to explain all her symptoms Could have some degree of diastolic dysfunction---with the relative tachycardia Will increase the metoprolol to 37.5 bid Recheck soon Advised that she can push herself, rest, then start again. She should continue the cardiac rehab  For now continue the furosemide. She does stay thirsty. May want to discontinue this though Did stop potassium since level was up

## 2011-11-25 NOTE — Patient Instructions (Signed)
Please increase the metoprolol to 37.5 mg (one and a half tabs) twice a day

## 2011-11-25 NOTE — Progress Notes (Signed)
Subjective:    Patient ID: Joan Hull, female    DOB: [birth year], 78 y.o.   MRN: [id number]  HPI Is having DOE----even just walking short distances Wasn't aware of this while at [facility] Now notices that she can't increase her activity Generally feels okay though  No chest pain Occ gets brief flutter in chest Had ablation for atrial fib when at [facility] for CABG Did have some rapid heart rate up to 120-130 a while ago---coreg changed to metoprolol and this helped (but wonders if it is related to her SOB)  No sleeping problems Sleeps flat  No orthopnea No PND  Current Outpatient Prescriptions on File Prior to Visit  Medication Sig Dispense Refill  . aspirin 81 MG tablet Take 81 mg by mouth daily.        . diphenhydrAMINE (BENADRYL) 25 mg capsule Take 25 mg by mouth daily.      . furosemide (LASIX) 40 MG tablet Take 40 mg by mouth daily with breakfast.       . lisinopril (PRINIVIL,ZESTRIL) 5 MG tablet Take 5 mg by mouth daily.      . metoprolol tartrate (LOPRESSOR) 25 MG tablet Take 25 mg by mouth daily.      Marland Kitchen omeprazole (PRILOSEC) 20 MG capsule Take 20 mg by mouth daily.      Marland Kitchen PARoxetine (PAXIL) 20 MG tablet Take 0.5 tablets (10 mg total) by mouth every morning.  15 tablet  11  . pravastatin (PRAVACHOL) 20 MG tablet Take 20 mg by mouth daily.      Marland Kitchen warfarin (COUMADIN) 6 MG tablet Take 6 mg by mouth daily.      Marland Kitchen zolpidem (AMBIEN) 10 MG tablet Take 1 tablet (10 mg total) by mouth at bedtime as needed.  30 tablet  1    Allergies  Allergen Reactions  . Aspirin   . Diazepam     REACTION: worsened bladder problems    Past Medical History  Diagnosis Date  . Anxiety   . Depression   . Hyperlipidemia   . Arthritis   . Osteoporosis   . GERD (gastroesophageal reflux disease)   . CAD (coronary artery disease)   . Interstitial cystitis   . Hx of colonic polyps   . Atrial fibrillation   . Non Q wave myocardial infarction 06/27/11    [facility]    Past Surgical History    Procedure Date  . Cataract extraction 2005  . Coronary stent placement 7/12    RCA with drug eluting stent---[name]  . Coronary artery bypass graft 1/13    [facility]    Family History  Problem Relation Age of Onset  . Heart disease Mother     cad  . Hypertension Mother   . Heart disease Father     heart attack  . Heart disease Sister     half sister MI  . Heart disease Brother     cabg  . Heart disease Brother   . Cancer Brother     BLADDER  . Cancer Sister     BRAIN  . Cancer Sister     OVARIAN    History   Social History  . Marital Status: Widowed    Spouse Name: N/A    Number of Children: 1  . Years of Education: N/A   Occupational History  . RETIRED ([employer])    Social History Main Topics  . Smoking status: Former Games developer  . Smokeless tobacco: Never Used  .  Alcohol Use: No  . Drug Use: Not on file  . Sexually Active: Not on file   Other Topics Concern  . Not on file   Social History Narrative  . No narrative on file     Review of Systems No cough No fever    Objective:   Physical Exam  Constitutional: She appears well-developed and well-nourished. No distress.  Neck: Normal range of motion. Neck supple. No JVD present. No thyromegaly present.  Cardiovascular: Regular rhythm and normal heart sounds.  Exam reveals no gallop.   No murmur heard.      Borderline tachycardia  Pulmonary/Chest: Effort normal. No respiratory distress. She has no wheezes. She has no rales.       Slightly decreased breath sounds at left base but no dullness  Abdominal: Soft. There is no tenderness.  Musculoskeletal: She exhibits no edema and no tenderness.  Lymphadenopathy:    She has no cervical adenopathy.  Psychiatric: She has a normal mood and affect. Her behavior is normal.          Assessment & Plan:

## 2011-11-26 ENCOUNTER — Telehealth: Payer: Self-pay | Admitting: *Deleted

## 2011-11-26 ENCOUNTER — Ambulatory Visit: Payer: Medicare Other | Admitting: Internal Medicine

## 2011-11-26 NOTE — Telephone Encounter (Signed)
Spoke with patient and advised results, she did take a extra lasix, she also have an appt with Dr. Cassie Freer next week

## 2011-11-26 NOTE — Telephone Encounter (Signed)
I called and had to leave message  Please call her back I don't see anything severe or worrisome and seeing Dr Darrold Junker next week should be fine---and will give a little while to see how the higher dose of the metoprolol will help I would not recommend she continue taking more than 1 furosemide a day----if she wants, she can try up to 3 days of twice daily dosing----more may dehydrate her and make things worse

## 2011-11-26 NOTE — Telephone Encounter (Signed)
Pt states she's still not feeling well, no energy and just plain exhausted, per verbal from Dr. Alphonsus Sias she needs to be seen by her cardiologist and she can also take a extra lasix today.

## 2011-11-28 NOTE — Telephone Encounter (Signed)
.  left message to have patient return my call.  

## 2011-11-28 NOTE — Telephone Encounter (Signed)
Patient notified as instructed by telephone. Pt said if her condition worsens over weekend what should she do. Pt will go to ER if necessary this weekend otherwise will see Dr Darrold Junker.

## 2011-12-07 ENCOUNTER — Inpatient Hospital Stay: Payer: Self-pay | Admitting: Internal Medicine

## 2011-12-07 DIAGNOSIS — R55 Syncope and collapse: Secondary | ICD-10-CM

## 2011-12-07 LAB — CK TOTAL AND CKMB (NOT AT ARMC)
CK, Total: 80 U/L (ref 21–215)
CK-MB: 3.2 ng/mL (ref 0.5–3.6)
CK-MB: 5.6 ng/mL — ABNORMAL HIGH (ref 0.5–3.6)

## 2011-12-07 LAB — BASIC METABOLIC PANEL
BUN: 37 mg/dL — ABNORMAL HIGH (ref 7–18)
Chloride: 106 mmol/L (ref 98–107)
Creatinine: 1.08 mg/dL (ref 0.60–1.30)
EGFR (African American): 55 — ABNORMAL LOW
Glucose: 125 mg/dL — ABNORMAL HIGH (ref 65–99)
Osmolality: 293 (ref 275–301)
Potassium: 3.9 mmol/L (ref 3.5–5.1)
Sodium: 142 mmol/L (ref 136–145)

## 2011-12-07 LAB — CBC WITH DIFFERENTIAL/PLATELET
Basophil #: 0 10*3/uL (ref 0.0–0.1)
Basophil %: 0.3 %
Eosinophil #: 0.1 10*3/uL (ref 0.0–0.7)
HCT: 32 % — ABNORMAL LOW (ref 35.0–47.0)
Lymphocyte %: 9.7 %
MCHC: 32.9 g/dL (ref 32.0–36.0)
MCV: 85 fL (ref 80–100)
Monocyte #: 0.9 x10 3/mm (ref 0.2–0.9)
Monocyte %: 10.6 %
Neutrophil %: 78.3 %
Platelet: 313 10*3/uL (ref 150–440)
WBC: 8.9 10*3/uL (ref 3.6–11.0)

## 2011-12-07 LAB — TROPONIN I: Troponin-I: 0.2 ng/mL — ABNORMAL HIGH

## 2011-12-08 LAB — PROTIME-INR: Prothrombin Time: 24.5 secs — ABNORMAL HIGH (ref 11.5–14.7)

## 2011-12-08 LAB — BASIC METABOLIC PANEL
Anion Gap: 16 (ref 7–16)
Co2: 19 mmol/L — ABNORMAL LOW (ref 21–32)
Creatinine: 1.53 mg/dL — ABNORMAL HIGH (ref 0.60–1.30)
EGFR (African American): 36 — ABNORMAL LOW
EGFR (Non-African Amer.): 31 — ABNORMAL LOW
Glucose: 211 mg/dL — ABNORMAL HIGH (ref 65–99)
Potassium: 4.3 mmol/L (ref 3.5–5.1)

## 2011-12-08 LAB — CK TOTAL AND CKMB (NOT AT ARMC): CK, Total: 102 U/L (ref 21–215)

## 2011-12-09 LAB — PROTIME-INR
INR: 2.2
Prothrombin Time: 24.2 secs — ABNORMAL HIGH (ref 11.5–14.7)

## 2011-12-10 ENCOUNTER — Inpatient Hospital Stay: Payer: Self-pay | Admitting: Internal Medicine

## 2011-12-10 DIAGNOSIS — R079 Chest pain, unspecified: Secondary | ICD-10-CM

## 2011-12-10 LAB — CK TOTAL AND CKMB (NOT AT ARMC)
CK, Total: 90 U/L (ref 21–215)
CK, Total: 86 U/L (ref 21–215)
CK-MB: 4.4 ng/mL — ABNORMAL HIGH (ref 0.5–3.6)

## 2011-12-10 LAB — COMPREHENSIVE METABOLIC PANEL
Albumin: 3 g/dL — ABNORMAL LOW (ref 3.4–5.0)
Alkaline Phosphatase: 200 U/L — ABNORMAL HIGH (ref 50–136)
Anion Gap: 16 (ref 7–16)
Calcium, Total: 8.5 mg/dL (ref 8.5–10.1)
Chloride: 104 mmol/L (ref 98–107)
Co2: 19 mmol/L — ABNORMAL LOW (ref 21–32)
Creatinine: 1.27 mg/dL (ref 0.60–1.30)
EGFR (Non-African Amer.): 39 — ABNORMAL LOW
Glucose: 154 mg/dL — ABNORMAL HIGH (ref 65–99)
Osmolality: 287 (ref 275–301)
Potassium: 4.5 mmol/L (ref 3.5–5.1)
SGOT(AST): 130 U/L — ABNORMAL HIGH (ref 15–37)
SGPT (ALT): 114 U/L — ABNORMAL HIGH
Sodium: 139 mmol/L (ref 136–145)
Total Protein: 6.4 g/dL (ref 6.4–8.2)

## 2011-12-10 LAB — CBC
HGB: 9.8 g/dL — ABNORMAL LOW (ref 12.0–16.0)
RBC: 3.58 10*6/uL — ABNORMAL LOW (ref 3.80–5.20)
WBC: 12.2 10*3/uL — ABNORMAL HIGH (ref 3.6–11.0)

## 2011-12-10 LAB — URINALYSIS, COMPLETE
Bacteria: NONE SEEN
Blood: NEGATIVE
Glucose,UR: NEGATIVE mg/dL (ref 0–75)
Leukocyte Esterase: NEGATIVE
Ph: 5 (ref 4.5–8.0)
Protein: 100
RBC,UR: 1 /HPF (ref 0–5)
Specific Gravity: 1.01 (ref 1.003–1.030)
Squamous Epithelial: NONE SEEN

## 2011-12-10 LAB — TROPONIN I
Troponin-I: 0.81 ng/mL — ABNORMAL HIGH
Troponin-I: 0.76 ng/mL — ABNORMAL HIGH

## 2011-12-10 LAB — PROTIME-INR
INR: 2.3
Prothrombin Time: 25.4 secs — ABNORMAL HIGH (ref 11.5–14.7)

## 2011-12-10 LAB — APTT: Activated PTT: 38.2 secs — ABNORMAL HIGH (ref 23.6–35.9)

## 2011-12-11 LAB — CBC WITH DIFFERENTIAL/PLATELET
Basophil %: 0.1 %
Eosinophil #: 0 10*3/uL (ref 0.0–0.7)
HGB: 10 g/dL — ABNORMAL LOW (ref 12.0–16.0)
Lymphocyte %: 12.7 %
MCHC: 32.1 g/dL (ref 32.0–36.0)
Monocyte #: 1.5 x10 3/mm — ABNORMAL HIGH (ref 0.2–0.9)
Monocyte %: 13.9 %
Neutrophil #: 7.9 10*3/uL — ABNORMAL HIGH (ref 1.4–6.5)
Platelet: 355 10*3/uL (ref 150–440)
WBC: 10.8 10*3/uL (ref 3.6–11.0)

## 2011-12-11 LAB — PROTIME-INR
INR: 2.3
Prothrombin Time: 25.1 secs — ABNORMAL HIGH (ref 11.5–14.7)

## 2011-12-11 LAB — COMPREHENSIVE METABOLIC PANEL
Albumin: 2.7 g/dL — ABNORMAL LOW (ref 3.4–5.0)
Anion Gap: 14 (ref 7–16)
Bilirubin,Total: 0.7 mg/dL (ref 0.2–1.0)
Calcium, Total: 8.2 mg/dL — ABNORMAL LOW (ref 8.5–10.1)
Chloride: 107 mmol/L (ref 98–107)
Co2: 18 mmol/L — ABNORMAL LOW (ref 21–32)
Creatinine: 1.32 mg/dL — ABNORMAL HIGH (ref 0.60–1.30)
EGFR (African American): 43 — ABNORMAL LOW
Osmolality: 287 (ref 275–301)
Potassium: 4.6 mmol/L (ref 3.5–5.1)
SGOT(AST): 229 U/L — ABNORMAL HIGH (ref 15–37)
SGPT (ALT): 153 U/L — ABNORMAL HIGH
Sodium: 139 mmol/L (ref 136–145)

## 2011-12-11 LAB — MAGNESIUM: Magnesium: 2.1 mg/dL

## 2011-12-11 LAB — CK TOTAL AND CKMB (NOT AT ARMC)
CK, Total: 487 U/L — ABNORMAL HIGH (ref 21–215)
CK-MB: 64.7 ng/mL — ABNORMAL HIGH (ref 0.5–3.6)
CK-MB: 69.6 ng/mL — ABNORMAL HIGH (ref 0.5–3.6)

## 2011-12-11 LAB — TROPONIN I
Troponin-I: 26 ng/mL — ABNORMAL HIGH
Troponin-I: 38 ng/mL — ABNORMAL HIGH

## 2011-12-17 ENCOUNTER — Encounter: Payer: Self-pay | Admitting: Cardiology

## 2011-12-17 ENCOUNTER — Encounter: Payer: Self-pay | Admitting: Internal Medicine

## 2011-12-17 HISTORY — PX: PACEMAKER INSERTION: SHX728

## 2011-12-18 ENCOUNTER — Ambulatory Visit: Payer: Medicare Other | Admitting: Internal Medicine

## 2011-12-23 ENCOUNTER — Encounter: Payer: Self-pay | Admitting: Internal Medicine

## 2011-12-25 LAB — CBC WITH DIFFERENTIAL/PLATELET
Basophil #: 0 10*3/uL (ref 0.0–0.1)
HCT: 25.4 % — ABNORMAL LOW (ref 35.0–47.0)
HGB: 8.3 g/dL — ABNORMAL LOW (ref 12.0–16.0)
Lymphocyte #: 0.8 10*3/uL — ABNORMAL LOW (ref 1.0–3.6)
MCHC: 32.5 g/dL (ref 32.0–36.0)
Monocyte #: 0.7 x10 3/mm (ref 0.2–0.9)
Monocyte %: 14.8 %
Neutrophil #: 3.3 10*3/uL (ref 1.4–6.5)
Neutrophil %: 66.1 %
Platelet: 189 10*3/uL (ref 150–440)
RBC: 2.87 10*6/uL — ABNORMAL LOW (ref 3.80–5.20)
RDW: 17.5 % — ABNORMAL HIGH (ref 11.5–14.5)
WBC: 5 10*3/uL (ref 3.6–11.0)

## 2011-12-25 LAB — BASIC METABOLIC PANEL
Anion Gap: 10 (ref 7–16)
BUN: 27 mg/dL — ABNORMAL HIGH (ref 7–18)
Creatinine: 0.85 mg/dL (ref 0.60–1.30)
EGFR (African American): >60
EGFR (Non-African Amer.): >60
Glucose: 89 mg/dL (ref 65–99)
Potassium: 4.1 mmol/L (ref 3.5–5.1)

## 2012-01-02 LAB — BASIC METABOLIC PANEL
Anion Gap: 13 (ref 7–16)
Calcium, Total: 8.5 mg/dL (ref 8.5–10.1)
Chloride: 102 mmol/L (ref 98–107)
Co2: 23 mmol/L (ref 21–32)
Glucose: 108 mg/dL — ABNORMAL HIGH (ref 65–99)
Osmolality: 286 (ref 275–301)
Potassium: 4.6 mmol/L (ref 3.5–5.1)
Sodium: 138 mmol/L (ref 136–145)

## 2012-01-02 LAB — CBC WITH DIFFERENTIAL/PLATELET
Basophil %: 0.4 %
Eosinophil #: 0.2 10*3/uL (ref 0.0–0.7)
Eosinophil %: 2.9 %
HGB: 9.7 g/dL — ABNORMAL LOW (ref 12.0–16.0)
Lymphocyte #: 0.9 10*3/uL — ABNORMAL LOW (ref 1.0–3.6)
Lymphocyte %: 15.6 %
MCV: 90 fL (ref 80–100)
Monocyte #: 1 x10 3/mm — ABNORMAL HIGH (ref 0.2–0.9)
Monocyte %: 16.4 %
Neutrophil #: 3.8 10*3/uL (ref 1.4–6.5)
Neutrophil %: 64.7 %
Platelet: 305 10*3/uL (ref 150–440)
RBC: 3.48 10*6/uL — ABNORMAL LOW (ref 3.80–5.20)

## 2012-01-02 LAB — PROTIME-INR: INR: 1.2

## 2012-01-06 LAB — BASIC METABOLIC PANEL
Anion Gap: 11 (ref 7–16)
Co2: 24 mmol/L (ref 21–32)
EGFR (African American): 40 — ABNORMAL LOW
EGFR (Non-African Amer.): 35 — ABNORMAL LOW
Glucose: 123 mg/dL — ABNORMAL HIGH (ref 65–99)
Osmolality: 279 (ref 275–301)

## 2012-01-08 LAB — BASIC METABOLIC PANEL
Anion Gap: 13 (ref 7–16)
Calcium, Total: 9 mg/dL (ref 8.5–10.1)
Chloride: 101 mmol/L (ref 98–107)
Co2: 25 mmol/L (ref 21–32)
Creatinine: 1.42 mg/dL — ABNORMAL HIGH (ref 0.60–1.30)
EGFR (Non-African Amer.): 34 — ABNORMAL LOW
Osmolality: 290 (ref 275–301)
Potassium: 4.5 mmol/L (ref 3.5–5.1)

## 2012-01-08 LAB — CBC WITH DIFFERENTIAL/PLATELET
Basophil %: 0.6 %
Eosinophil #: 0.2 10*3/uL (ref 0.0–0.7)
Eosinophil %: 2.9 %
HCT: 31.9 % — ABNORMAL LOW (ref 35.0–47.0)
HGB: 10 g/dL — ABNORMAL LOW (ref 12.0–16.0)
Lymphocyte %: 13.8 %
MCH: 27.9 pg (ref 26.0–34.0)
Monocyte #: 0.8 x10 3/mm (ref 0.2–0.9)
RBC: 3.57 10*6/uL — ABNORMAL LOW (ref 3.80–5.20)

## 2012-01-17 ENCOUNTER — Encounter: Payer: Self-pay | Admitting: Cardiology

## 2012-01-17 ENCOUNTER — Encounter: Payer: Self-pay | Admitting: Internal Medicine

## 2012-01-20 ENCOUNTER — Ambulatory Visit: Payer: Medicare Other | Admitting: Internal Medicine

## 2012-01-28 ENCOUNTER — Ambulatory Visit: Payer: Medicare Other | Admitting: Internal Medicine

## 2012-01-28 ENCOUNTER — Ambulatory Visit (INDEPENDENT_AMBULATORY_CARE_PROVIDER_SITE_OTHER): Payer: Medicare Other | Admitting: Internal Medicine

## 2012-01-28 ENCOUNTER — Encounter: Payer: Self-pay | Admitting: Internal Medicine

## 2012-01-28 VITALS — BP 100/60 | HR 73 | Temp 98.3°F | Ht 61.0 in | Wt 93.0 lb

## 2012-01-28 DIAGNOSIS — I2510 Atherosclerotic heart disease of native coronary artery without angina pectoris: Secondary | ICD-10-CM

## 2012-01-28 DIAGNOSIS — I4891 Unspecified atrial fibrillation: Secondary | ICD-10-CM

## 2012-01-28 DIAGNOSIS — F411 Generalized anxiety disorder: Secondary | ICD-10-CM

## 2012-01-28 LAB — CBC WITH DIFFERENTIAL/PLATELET
Basophils Absolute: 0 10*3/uL (ref 0.0–0.1)
Eosinophils Relative: 2.7 % (ref 0.0–5.0)
HCT: 44.5 % (ref 36.0–46.0)
Hemoglobin: 14.3 g/dL (ref 12.0–15.0)
Lymphocytes Relative: 26.6 % (ref 12.0–46.0)
Lymphs Abs: 1.8 10*3/uL (ref 0.7–4.0)
Monocytes Relative: 14 % — ABNORMAL HIGH (ref 3.0–12.0)
Neutro Abs: 3.7 10*3/uL (ref 1.4–7.7)
Platelets: 274 10*3/uL (ref 150.0–400.0)
RDW: 22.4 % — ABNORMAL HIGH (ref 11.5–14.6)
WBC: 6.6 10*3/uL (ref 4.5–10.5)

## 2012-01-28 LAB — BASIC METABOLIC PANEL
Calcium: 10 mg/dL (ref 8.4–10.5)
GFR: 36.58 mL/min — ABNORMAL LOW (ref >60.00)
Glucose, Bld: 74 mg/dL (ref 70–99)
Potassium: 5.2 mEq/L — ABNORMAL HIGH (ref 3.5–5.1)
Sodium: 138 mEq/L (ref 135–145)

## 2012-01-28 NOTE — Progress Notes (Signed)
Subjective:    Patient ID: Joan Hull, female    DOB: 1926/02/22, 78 y.o.   MRN: [id number]  HPI Had another syncope spell Atrial fib or sick sinus  Had pacer put in at [facility]  Then to [facility] again for rehab Just got out last week "went through a lot" and that is why she lost weight  Breathing is better  Not as tired when she exerts herself Able to do more and not wearing out  No chest pain No palpitations No edema now---had a lot of fluid after procedure  Now sees Dr Newby--cardiologist at [facility]  Current Outpatient Prescriptions on File Prior to Visit  Medication Sig Dispense Refill  . aspirin 81 MG tablet Take 81 mg by mouth daily.        Marland Kitchen FERROUS SULFATE PO Take 5 mg by mouth 2 (two) times daily.      . pantoprazole (PROTONIX) 40 MG tablet Take 40 mg by mouth daily.       . simvastatin (ZOCOR) 20 MG tablet Take 20 mg by mouth at bedtime.       Marland Kitchen spironolactone (ALDACTONE) 25 MG tablet Take 25 mg by mouth daily.       Marland Kitchen torsemide (DEMADEX) 20 MG tablet Take 20 mg by mouth daily.       Marland Kitchen DISCONTD: PARoxetine (PAXIL) 20 MG tablet Take 0.5 tablets (10 mg total) by mouth every morning.  15 tablet  11  . DISCONTD: zolpidem (AMBIEN) 10 MG tablet Take 1 tablet (10 mg total) by mouth at bedtime as needed.  30 tablet  1    Allergies  Allergen Reactions  . Aspirin   . Diazepam     REACTION: worsened bladder problems    Past Medical History  Diagnosis Date  . Anxiety   . Depression   . Hyperlipidemia   . Arthritis   . Osteoporosis   . GERD (gastroesophageal reflux disease)   . CAD (coronary artery disease)   . Interstitial cystitis   . Hx of colonic polyps   . Atrial fibrillation   . Non Q wave myocardial infarction 06/27/11    [facility]    Past Surgical History  Procedure Date  . Cataract extraction 2005  . Coronary stent placement 7/12    RCA with drug eluting stent---[name]  . Coronary artery bypass graft 1/13    [facility]  . Pacemaker insertion 5/13   [facility]    Family History  Problem Relation Age of Onset  . Heart disease Mother     cad  . Hypertension Mother   . Heart disease Father     heart attack  . Heart disease Sister     half sister MI  . Heart disease Brother     cabg  . Heart disease Brother   . Cancer Brother     BLADDER  . Cancer Sister     BRAIN  . Cancer Sister     OVARIAN    History   Social History  . Marital Status: Widowed    Spouse Name: N/A    Number of Children: 1  . Years of Education: N/A   Occupational History  . RETIRED ([employer])    Social History Main Topics  . Smoking status: Former Games developer  . Smokeless tobacco: Never Used  . Alcohol Use: No  . Drug Use: Not on file  . Sexually Active: Not on file   Other Topics Concern  . Not on file  Social History Narrative  . No narrative on file   Review of Systems Sleeping well Appetite is improving---still not normal    Objective:   Physical Exam  Constitutional: She appears well-developed. No distress.  Neck: Normal range of motion. Neck supple.  Cardiovascular: Normal rate, regular rhythm and normal heart sounds.  Exam reveals no gallop.   No murmur heard. Pulmonary/Chest: Effort normal and breath sounds normal. No respiratory distress. She has no wheezes. She has no rales.  Abdominal: Soft. There is no tenderness.  Musculoskeletal: She exhibits no edema and no tenderness.  Lymphadenopathy:    She has no cervical adenopathy.  Skin:       Has well granulated skin tear on left anterior calf---no infection  Psychiatric: She has a normal mood and affect. Her behavior is normal.          Assessment & Plan:

## 2012-01-28 NOTE — Assessment & Plan Note (Signed)
And apparent sick sinus syndrome with syncope Feels better now since pacemaker put in Exercise tolerance much better now

## 2012-01-28 NOTE — Patient Instructions (Signed)
Please weigh yourself every morning. Don't take the torsemide or spironolactone if your home weight is 90# or below

## 2012-01-28 NOTE — Assessment & Plan Note (Signed)
Seems to be better now that she feels better Still on the med

## 2012-01-28 NOTE — Assessment & Plan Note (Addendum)
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

## 2012-02-05 ENCOUNTER — Encounter: Payer: Self-pay | Admitting: Internal Medicine

## 2012-02-05 ENCOUNTER — Telehealth: Payer: Self-pay | Admitting: Internal Medicine

## 2012-02-05 ENCOUNTER — Ambulatory Visit (INDEPENDENT_AMBULATORY_CARE_PROVIDER_SITE_OTHER): Payer: Medicare Other | Admitting: Internal Medicine

## 2012-02-05 VITALS — BP 100/60 | HR 84 | Temp 98.4°F | Wt 94.0 lb

## 2012-02-05 DIAGNOSIS — R110 Nausea: Secondary | ICD-10-CM

## 2012-02-05 DIAGNOSIS — I4891 Unspecified atrial fibrillation: Secondary | ICD-10-CM

## 2012-02-05 DIAGNOSIS — K625 Hemorrhage of anus and rectum: Secondary | ICD-10-CM

## 2012-02-05 NOTE — Patient Instructions (Addendum)
Please stay off the iron Please don't take the torsemide or the spironolactone if your home weight is under 93#

## 2012-02-05 NOTE — Assessment & Plan Note (Signed)
Seems regular now Upset about the price of the apixaban Advised that she discuss options with Dr Lars Pinks

## 2012-02-05 NOTE — Telephone Encounter (Signed)
Will see then and evaluate Generally don't get bloody diarrhea from a med

## 2012-02-05 NOTE — Assessment & Plan Note (Signed)
This has been persistent since rehab Better off iron---will have her stay off this Did restart the statin today and no nausea---will have her try to continue

## 2012-02-05 NOTE — Assessment & Plan Note (Signed)
Looks to be local source Doubt colitis Now feels better No action for now

## 2012-02-05 NOTE — Telephone Encounter (Signed)
EMERGENT: Caller: Joan Hull/Patient; PCP: Tillman Abide; CB#: [phone];  Call regarding Question What Medication She Is Taking That Can Be Causing Her Diarrhea X 4 Days (onset 02/01/12) and intermittent nausea over past 2 months; Stopped Taking Iron after diarrhea began. Reports 4-6 bloody diarrhea stools qd.  Recently started drinking more milk.  Advised to see MD now for bloody diarrhea that has not been evaluated per Diarrhea orOther Change in Bowel Habits Guideline.  Appt scheduled for 1500 02/05/12 with Dr Alphonsus Sias.

## 2012-02-05 NOTE — Progress Notes (Signed)
Subjective:    Patient ID: Joan Hull, female    DOB: 1926/08/15, 78 y.o.   MRN: [id number]  HPI Has had some black stools from the iron Some nausea in AM --goes back to when she was in rehab This is better now since she stopped the iron and statin Now she feels better again  [facility] cardiology nurse at [facility] Instructed to cut lisinopril in half also  GI problems really worsened 4 days ago Increased stools-- 4-5 times per day Down to twice today Saw bright red blood in stool for last 2-3 days ---still some on toilet tissue now (bottom is sore)  No abdominal pain No vomiting  Still taking the fluid pills Never held them Eating okay actually  Put on new anticoagulant---eliquis Very expensive--she wonders about alternatives   Current Outpatient Prescriptions on File Prior to Visit  Medication Sig Dispense Refill  . aspirin 81 MG tablet Take 81 mg by mouth daily.        Marland Kitchen FERROUS SULFATE PO Take 5 mg by mouth 2 (two) times daily.      Marland Kitchen lisinopril (PRINIVIL,ZESTRIL) 20 MG tablet Take 20 mg by mouth daily.      . metoprolol (LOPRESSOR) 50 MG tablet Take 50 mg by mouth 2 (two) times daily.      . pantoprazole (PROTONIX) 40 MG tablet Take 40 mg by mouth daily.       Marland Kitchen PARoxetine (PAXIL) 20 MG tablet Take 10 mg by mouth every morning.      . simvastatin (ZOCOR) 20 MG tablet Take 20 mg by mouth at bedtime.       Marland Kitchen spironolactone (ALDACTONE) 25 MG tablet Take 25 mg by mouth daily.       Marland Kitchen torsemide (DEMADEX) 20 MG tablet Take 20 mg by mouth daily.       Marland Kitchen zolpidem (AMBIEN) 10 MG tablet Take 5-10 mg by mouth at bedtime as needed.        Allergies  Allergen Reactions  . Aspirin   . Diazepam     REACTION: worsened bladder problems    Past Medical History  Diagnosis Date  . Anxiety   . Depression   . Hyperlipidemia   . Arthritis   . Osteoporosis   . GERD (gastroesophageal reflux disease)   . CAD (coronary artery disease)   . Interstitial cystitis   . Hx of colonic  polyps   . Atrial fibrillation   . Non Q wave myocardial infarction 06/27/11    [facility]    Past Surgical History  Procedure Date  . Cataract extraction 2005  . Coronary stent placement 7/12    RCA with drug eluting stent---[name]  . Coronary artery bypass graft 1/13    [facility]  . Pacemaker insertion 5/13    [facility]    Family History  Problem Relation Age of Onset  . Heart disease Mother     cad  . Hypertension Mother   . Heart disease Father     heart attack  . Heart disease Sister     half sister MI  . Heart disease Brother     cabg  . Heart disease Brother   . Cancer Brother     BLADDER  . Cancer Sister     BRAIN  . Cancer Sister     OVARIAN    History   Social History  . Marital Status: Widowed    Spouse Name: N/A    Number of Children: 1  .  Years of Education: N/A   Occupational History  . RETIRED ([employer])    Social History Main Topics  . Smoking status: Former Games developer  . Smokeless tobacco: Never Used  . Alcohol Use: No  . Drug Use: Not on file  . Sexually Active: Not on file   Other Topics Concern  . Not on file   Social History Narrative  . No narrative on file   Review of Systems No fever No SOB Energy levels are some better today Raspy voice persists    Objective:   Physical Exam  Constitutional: She appears well-developed and well-nourished. No distress.  Neck: Normal range of motion. Neck supple.  Cardiovascular: Normal rate, regular rhythm and normal heart sounds.   Pulmonary/Chest: Effort normal and breath sounds normal. No respiratory distress. She has no wheezes. She has no rales.  Abdominal: Soft. Bowel sounds are normal. She exhibits no distension. There is no tenderness.  Genitourinary:       Small slightly inflamed hemorrhoid No mass Secretions heme negative  Lymphadenopathy:    She has no cervical adenopathy.          Assessment & Plan:

## 2012-02-06 ENCOUNTER — Encounter: Payer: Self-pay | Admitting: *Deleted

## 2012-02-10 ENCOUNTER — Telehealth: Payer: Self-pay | Admitting: Internal Medicine

## 2012-02-10 NOTE — Telephone Encounter (Signed)
Can you call the patient.  Be sure she is drinking plenty of fluid, i can see the patient at a different time if more convenient to her, or another provider as well.

## 2012-02-10 NOTE — Telephone Encounter (Signed)
Caller: [place]/Patient; PCP: Tillman Abide I.; CB#: [phone]; Call regarding Diarrhea;  Onset: 02/01/12.  Afebrile.  Reports 4-6 foul smelling, bloody/mucus diarrhea stools qd. States minimal blood in stool and on tissue after BM.  Advised to see MD within 24 hrs for evaluated by provider and symptoms not improving or worsening with suggested treatment or home care per Diarrhea or Other Changes in Bowel Habits Guideline. Declined appt for 0945 02/11/12 with Dr Patsy Lager d/t already has afternoon appt for heart track and does not want to drive that far twice.  Advised risk to health if waits longer than 24 hrs to be seen.  Requests appt for Friday. Transferred to office /Carrie per caller request for appt 02/13/12. Appt scheduled for 1400 appt 02/11/12 with Dr Patsy Lager.

## 2012-02-11 ENCOUNTER — Encounter: Payer: Self-pay | Admitting: Family Medicine

## 2012-02-11 ENCOUNTER — Ambulatory Visit (INDEPENDENT_AMBULATORY_CARE_PROVIDER_SITE_OTHER): Payer: Medicare Other | Admitting: Family Medicine

## 2012-02-11 VITALS — BP 122/68 | HR 68 | Temp 98.0°F | Ht 61.0 in | Wt 96.5 lb

## 2012-02-11 DIAGNOSIS — R197 Diarrhea, unspecified: Secondary | ICD-10-CM

## 2012-02-11 DIAGNOSIS — A0472 Enterocolitis due to Clostridium difficile, not specified as recurrent: Secondary | ICD-10-CM

## 2012-02-11 DIAGNOSIS — K625 Hemorrhage of anus and rectum: Secondary | ICD-10-CM

## 2012-02-11 MED ORDER — HYDROCORTISONE 2.5 % RE CREA: TOPICAL_CREAM | Freq: Two times a day (BID) | RECTAL | Status: AC

## 2012-02-11 NOTE — Patient Instructions (Signed)
REFERRAL: GO THE THE FRONT ROOM AT THE ENTRANCE OF OUR CLINIC, NEAR CHECK IN. ASK FOR MARION. SHE WILL HELP YOU SET UP YOUR REFERRAL. DATE: TIME:  
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
## 2012-02-11 NOTE — Telephone Encounter (Signed)
Patient says she is drinking lots of fluids but, does not think she can make it in any sooner

## 2012-02-13 ENCOUNTER — Telehealth: Payer: Self-pay | Admitting: Radiology

## 2012-02-13 LAB — FECAL LACTOFERRIN, QUANT: Lactoferrin: POSITIVE

## 2012-02-13 LAB — CLOSTRIDIUM DIFFICILE BY PCR: Toxigenic C. Difficile by PCR: DETECTED — CR

## 2012-02-13 MED ORDER — METRONIDAZOLE 500 MG PO TABS: 500.0000 mg | ORAL_TABLET | Freq: Three times a day (TID) | ORAL | Status: AC

## 2012-02-13 NOTE — Telephone Encounter (Signed)
[facility] lab called critical results, C-Diff - POSITIVE. Results verbally given to Dr Alphonsus Sias

## 2012-02-13 NOTE — Telephone Encounter (Signed)
Message left for her to call back Stool test did show evidence of a type of infection called Clostridium dificile. It often occurs after taking antibioitics but not always. A different antibiotic is needed to treat the inflammation of the bowel (which probably explains her stool frequency and blood) Please send Rx for metronidazole 500mg   1 tid #30x 0 If she has any trouble tolerating the med, we will try 1/2 the dose She cannot drink alcohol with the med

## 2012-02-16 ENCOUNTER — Encounter: Payer: Self-pay | Admitting: Cardiology

## 2012-02-16 LAB — STOOL CULTURE

## 2012-02-16 MED ORDER — PAROXETINE HCL 20 MG PO TABS: 10.0000 mg | ORAL_TABLET | ORAL | Status: DC

## 2012-02-16 MED ORDER — ZOLPIDEM TARTRATE 5 MG PO TABS: 5.0000 mg | ORAL_TABLET | Freq: Every evening | ORAL | Status: DC | PRN

## 2012-02-16 NOTE — Telephone Encounter (Signed)
Patient also asking for refills that was done by Dr. Dareen Piano?

## 2012-02-16 NOTE — Telephone Encounter (Signed)
Please find out what med she is talking about

## 2012-02-16 NOTE — Telephone Encounter (Signed)
Spoke with patient and advised results, pt has started taking the medication and will go for her GI visit at [facility] 02/18/2012 @ 11am .

## 2012-02-16 NOTE — Telephone Encounter (Signed)
Spoke with patient and advised results   

## 2012-02-16 NOTE — Telephone Encounter (Signed)
I added them in meds and orders, also pt wanted to know if she should continue lasix? Her legs are swelling and took one last night, please advise

## 2012-02-16 NOTE — Telephone Encounter (Signed)
Okay paroxetine for 1 year Zolpidem 5mg   #60 x 0

## 2012-02-16 NOTE — Telephone Encounter (Signed)
Ok to refill [place] and paxil?

## 2012-02-16 NOTE — Telephone Encounter (Signed)
I would have her hold off on lasix till her bowels are better--it could lead to dehydration

## 2012-02-20 ENCOUNTER — Other Ambulatory Visit: Payer: Self-pay | Admitting: *Deleted

## 2012-02-20 MED ORDER — METOPROLOL TARTRATE 50 MG PO TABS: 50.0000 mg | ORAL_TABLET | Freq: Two times a day (BID) | ORAL | Status: DC

## 2012-02-20 MED ORDER — SIMVASTATIN 20 MG PO TABS: 20.0000 mg | ORAL_TABLET | Freq: Every day | ORAL | Status: DC

## 2012-02-23 ENCOUNTER — Other Ambulatory Visit: Payer: Self-pay | Admitting: *Deleted

## 2012-02-23 MED ORDER — PANTOPRAZOLE SODIUM 40 MG PO TBEC: 40.0000 mg | DELAYED_RELEASE_TABLET | Freq: Every day | ORAL | Status: DC

## 2012-02-25 ENCOUNTER — Other Ambulatory Visit: Payer: Self-pay | Admitting: *Deleted

## 2012-02-25 MED ORDER — APIXABAN 2.5 MG PO TABS: 2.5000 mg | ORAL_TABLET | Freq: Two times a day (BID) | ORAL | Status: DC

## 2012-02-25 NOTE — Telephone Encounter (Signed)
Also patient wanted to know about the fluid pills? If her weight is under 90 then hold torsemide/ spironolactone if she's over 90 would she take both? Pt also thinks her hair loss is related to her medication? Please advise on refill

## 2012-02-25 NOTE — Telephone Encounter (Signed)
rx sent to pharmacy by e-script .left message to have patient return my call.  

## 2012-02-25 NOTE — Telephone Encounter (Signed)
Okay to renew the apixaban for 3 months  Just have her take the torsemide if her weight is over 90#

## 2012-02-26 NOTE — Telephone Encounter (Signed)
Spoke with patient and advised results   

## 2012-03-10 ENCOUNTER — Other Ambulatory Visit: Payer: Self-pay | Admitting: *Deleted

## 2012-03-10 MED ORDER — LISINOPRIL 20 MG PO TABS: 20.0000 mg | ORAL_TABLET | Freq: Every day | ORAL | Status: DC

## 2012-03-10 NOTE — Telephone Encounter (Signed)
Called patient to see how she's taking lisinopril, we have 20 mg tablet take 1/2, but per pt she takes a whole tablet. rx sent to pharmacy by e-script

## 2012-03-12 ENCOUNTER — Telehealth: Payer: Self-pay | Admitting: Internal Medicine

## 2012-03-12 NOTE — Telephone Encounter (Signed)
Question about medicine and a blister on a surgery site.  Please call patient.

## 2012-03-12 NOTE — Telephone Encounter (Signed)
Spoke with patient and she would like to know if a probiotic would be ok for her to take? The [facility] brand? Pt states she also have a blister in between her breasts where she had heart surgery at, its not sore just red around the area. Pt has appt to see dermatologist and will have him to look at it. Please advise.  ( she knows you're out until Monday)

## 2012-03-14 NOTE — Telephone Encounter (Signed)
Okay to take a probiotic but it should be for a reason---like hoping it will make her bowels more regular. If it doesn't help, she should stop after 1-2 months  Not sure what the blister is---but there are often mildly irritated areas on chest after CABG. I can check it for her if she wants

## 2012-03-16 NOTE — Telephone Encounter (Signed)
okay

## 2012-03-16 NOTE — Telephone Encounter (Signed)
Patient advised.  She states she feels better now than she has in 2 years.  She has an appt with the dermatologist later today and will ask him to check this spot as well as the area that the appt was scheduled for.

## 2012-03-18 ENCOUNTER — Encounter: Payer: Self-pay | Admitting: Internal Medicine

## 2012-03-18 ENCOUNTER — Encounter: Payer: Self-pay | Admitting: Cardiology

## 2012-03-18 ENCOUNTER — Ambulatory Visit (INDEPENDENT_AMBULATORY_CARE_PROVIDER_SITE_OTHER): Payer: Medicare Other | Admitting: Internal Medicine

## 2012-03-18 VITALS — BP 120/66 | HR 71 | Temp 97.7°F | Ht 61.0 in | Wt 97.0 lb

## 2012-03-18 DIAGNOSIS — I2510 Atherosclerotic heart disease of native coronary artery without angina pectoris: Secondary | ICD-10-CM

## 2012-03-18 NOTE — Assessment & Plan Note (Signed)
Doing fine No contraindication to driving DMV form done---restricted to no interstate and no night driving
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
## 2012-03-19 ENCOUNTER — Telehealth: Payer: Self-pay | Admitting: Internal Medicine

## 2012-03-19 NOTE — Telephone Encounter (Signed)
Patient has been restricted to no driving after dark.  Patient said she goes to church on Sunday nights and has church activities after dark. Patient said the [place] 2-3 miles away from her home. She doesn't have anyone who can take her to [place].  Patient is asking if the restriction can be lifted for church activities at night.  Please call patient back and if she's not at home a message can be left on her answering machine.  DMV form is on your desk.

## 2012-03-19 NOTE — Telephone Encounter (Signed)
I will change the restriction so she can still drive at night

## 2012-03-24 ENCOUNTER — Other Ambulatory Visit: Payer: Self-pay | Admitting: *Deleted

## 2012-03-24 MED ORDER — METOPROLOL TARTRATE 50 MG PO TABS: 50.0000 mg | ORAL_TABLET | Freq: Two times a day (BID) | ORAL | Status: DC

## 2012-03-24 MED ORDER — SIMVASTATIN 20 MG PO TABS: 20.0000 mg | ORAL_TABLET | Freq: Every day | ORAL | Status: DC

## 2012-04-16 ENCOUNTER — Other Ambulatory Visit: Payer: Self-pay | Admitting: *Deleted

## 2012-04-16 MED ORDER — ZOLPIDEM TARTRATE 5 MG PO TABS: 5.0000 mg | ORAL_TABLET | Freq: Every evening | ORAL | Status: DC | PRN

## 2012-04-16 NOTE — Telephone Encounter (Signed)
Last filled 03-18-12.

## 2012-04-16 NOTE — Telephone Encounter (Signed)
Looks like last approval was 7/1 #60 should last 2 months Remind her max of 1 per night Okay #60 x 0

## 2012-04-16 NOTE — Telephone Encounter (Signed)
Pt called back stating she "needs her meds now and does not know why we are stopping her from getting them". Very upset and could barely speak with her on the phone.

## 2012-04-16 NOTE — Telephone Encounter (Signed)
Spoke with pharmacist at [facility] and he would prefer pt have #30 instead of #60, because if she gets 60 she's going to take 2 of the 5 mg, per pharmacist pt requests a special brand of Ambien and they have to special order just for her. I also spoke with patient again and she's not happy at all and stated she was dependent on Ambien like she was valium. Pt states she will call because this is not going to work, she won't be able to get any sleep.  rx called into pharmacy for #30 with 1 refill

## 2012-04-16 NOTE — Telephone Encounter (Signed)
There is a formal recommendation from the FDA that folks over 65, especially women, not take over a 5mg  dose Please ask her to TRY a lower dose I don't generally prescribe Remus Loffler because people get so emotionally involved with it and clearly dependent on it. She is clearly dependent and it would be good for her health if she tried to take a lower dose!!!  I did okay #60 x 0

## 2012-04-16 NOTE — Telephone Encounter (Signed)
Spoke with patient and she states she take 10mg  of Ambien and nothing less, pt was very upset that I called her and stated "i'm not a drug addict" I advised what we have in our chart is 5 mg once daily at bedtime and per pt she's been taking 10 mg for years and doesn't understand why she can't take 10 mg. Please advise

## 2012-04-17 NOTE — Telephone Encounter (Signed)
I think we were better off with the #60 because she is probably not going to decrease and we are just making things hard on her  My responsibility is to at least let her know of recommendations for lower dosage

## 2012-04-18 ENCOUNTER — Encounter: Payer: Self-pay | Admitting: Cardiology

## 2012-04-20 NOTE — Telephone Encounter (Signed)
Patient already picked up rx, will refill again

## 2012-04-28 ENCOUNTER — Telehealth: Payer: Self-pay

## 2012-04-28 NOTE — Telephone Encounter (Signed)
Pt left v/m question about letter received from DOT and question about paroxetine. No further information left. Left v/m for pt to call back.

## 2012-04-29 NOTE — Telephone Encounter (Signed)
I had crossed something out on the form and they probably didn't like that I will clarify when I see the letter they sent  Those meds sound right if correct on med list

## 2012-04-29 NOTE — Telephone Encounter (Signed)
Spoke with patient and advised results   

## 2012-04-29 NOTE — Telephone Encounter (Signed)
Pt received letter from DOT more info is needed to determine pts driving privileges. Pt will mail letter to Dr Alphonsus Sias for him to review. Pt has omega D 3(fish oil) 1000 mg. Is it ok for pt to take and if so how many a day. Pt wanted to verify take paroxetine 10 mg daily. That is instructions on med list and pt said that is what she has been taking.

## 2012-04-30 ENCOUNTER — Encounter: Payer: Self-pay | Admitting: Internal Medicine

## 2012-04-30 DIAGNOSIS — Z0279 Encounter for issue of other medical certificate: Secondary | ICD-10-CM

## 2012-05-27 ENCOUNTER — Other Ambulatory Visit: Payer: Self-pay | Admitting: *Deleted

## 2012-05-28 MED ORDER — APIXABAN 2.5 MG PO TABS: 2.5000 mg | ORAL_TABLET | Freq: Two times a day (BID) | ORAL | Status: DC

## 2012-05-28 NOTE — Telephone Encounter (Signed)
Medicine sent to pharmacy

## 2012-05-28 NOTE — Telephone Encounter (Signed)
Okay to refill for a year 

## 2012-06-02 ENCOUNTER — Ambulatory Visit (INDEPENDENT_AMBULATORY_CARE_PROVIDER_SITE_OTHER): Payer: Medicare Other | Admitting: Internal Medicine

## 2012-06-02 ENCOUNTER — Encounter: Payer: Self-pay | Admitting: Internal Medicine

## 2012-06-02 VITALS — BP 130/68 | HR 60 | Temp 97.8°F | Wt 100.0 lb

## 2012-06-02 DIAGNOSIS — I4891 Unspecified atrial fibrillation: Secondary | ICD-10-CM

## 2012-06-02 DIAGNOSIS — F411 Generalized anxiety disorder: Secondary | ICD-10-CM

## 2012-06-02 DIAGNOSIS — E785 Hyperlipidemia, unspecified: Secondary | ICD-10-CM

## 2012-06-02 DIAGNOSIS — I2510 Atherosclerotic heart disease of native coronary artery without angina pectoris: Secondary | ICD-10-CM

## 2012-06-02 LAB — BASIC METABOLIC PANEL
BUN: 23 mg/dL (ref 6–23)
Calcium: 9.2 mg/dL (ref 8.4–10.5)
Creatinine, Ser: 0.9 mg/dL (ref 0.4–1.2)
GFR: 61.02 mL/min (ref >60.00)
Glucose, Bld: 72 mg/dL (ref 70–99)
Potassium: 4.6 mEq/L (ref 3.5–5.1)

## 2012-06-02 LAB — CBC WITH DIFFERENTIAL/PLATELET
Basophils Absolute: 0 10*3/uL (ref 0.0–0.1)
HCT: 39.6 % (ref 36.0–46.0)
Lymphs Abs: 1.3 10*3/uL (ref 0.7–4.0)
Monocytes Absolute: 0.9 10*3/uL (ref 0.1–1.0)
Monocytes Relative: 16.2 % — ABNORMAL HIGH (ref 3.0–12.0)
Neutrophils Relative %: 57.7 % (ref 43.0–77.0)
Platelets: 201 10*3/uL (ref 150.0–400.0)
RDW: 14.1 % (ref 11.5–14.6)

## 2012-06-02 NOTE — Assessment & Plan Note (Signed)
Doing fairly well since the CABG Has small open area at lower part of sternal wound---not infected No changes needed  ??CHF---I don't have records to suggest decreased LV function---though she was put on spironolactone Diuretics are on hold still due to dehydration on them (she uses spironolactone if fluid builds up. Discussed she may be better off using the torsemide)

## 2012-06-02 NOTE — Assessment & Plan Note (Signed)
Paced rhythm On the apixaban for anticoagulation

## 2012-06-02 NOTE — Progress Notes (Signed)
Subjective:    Patient ID: Joan Hull, female    DOB: August 10, 1926, 78 y.o.   MRN: [id number]  HPI Doing okay Did get things straightened out with DMV Feels good  Occasional SOB in the evening---if she has been busy all day Taking probiotic now and biotin No nausea or diarrhea  Gets anxious at times Especially at night---has to have her zolpidem Otherwise, anxiety has been controlled  No heart trouble No chest pain or edema Still seeping at chest wound  Remains on statin Current Outpatient Prescriptions on File Prior to Visit  Medication Sig Dispense Refill  . apixaban (ELIQUIS) 2.5 MG TABS tablet Take 1 tablet (2.5 mg total) by mouth 2 (two) times daily.  60 tablet  11  . aspirin 81 MG tablet Take 81 mg by mouth daily.        Marland Kitchen lisinopril (PRINIVIL,ZESTRIL) 20 MG tablet Take 1 tablet (20 mg total) by mouth daily.  30 tablet  11  . metoprolol (LOPRESSOR) 50 MG tablet Take 1 tablet (50 mg total) by mouth 2 (two) times daily.  60 tablet  11  . pantoprazole (PROTONIX) 40 MG tablet Take 1 tablet (40 mg total) by mouth daily.  30 tablet  11  . PARoxetine (PAXIL) 20 MG tablet Take 0.5 tablets (10 mg total) by mouth every morning.  30 tablet  11  . simvastatin (ZOCOR) 20 MG tablet Take 1 tablet (20 mg total) by mouth at bedtime.  30 tablet  11  . spironolactone (ALDACTONE) 25 MG tablet Take 25 mg by mouth daily.       Marland Kitchen torsemide (DEMADEX) 20 MG tablet Take 20 mg by mouth daily.       Marland Kitchen zolpidem (AMBIEN) 5 MG tablet Take 1 tablet (5 mg total) by mouth at bedtime as needed.  30 tablet  1    Allergies  Allergen Reactions  . Aspirin   . Diazepam     REACTION: worsened bladder problems    Past Medical History  Diagnosis Date  . Anxiety   . Depression   . Hyperlipidemia   . Arthritis   . Osteoporosis   . GERD (gastroesophageal reflux disease)   . CAD (coronary artery disease)   . Interstitial cystitis   . Hx of colonic polyps   . Atrial fibrillation   . Non Q wave  myocardial infarction 06/27/11    [facility]    Past Surgical History  Procedure Date  . Cataract extraction 2005  . Coronary stent placement 7/12    RCA with drug eluting stent---[name]  . Coronary artery bypass graft 1/13    [facility]  . Pacemaker insertion 5/13    [facility]    Family History  Problem Relation Age of Onset  . Heart disease Mother     cad  . Hypertension Mother   . Heart disease Father     heart attack  . Heart disease Sister     half sister MI  . Heart disease Brother     cabg  . Heart disease Brother   . Cancer Brother     BLADDER  . Cancer Sister     BRAIN  . Cancer Sister     OVARIAN    History   Social History  . Marital Status: Widowed    Spouse Name: N/A    Number of Children: 1  . Years of Education: N/A   Occupational History  . RETIRED ([employer])    Social History  Main Topics  . Smoking status: Former Games developer  . Smokeless tobacco: Never Used  . Alcohol Use: No  . Drug Use: No  . Sexually Active: Not on file   Other Topics Concern  . Not on file   Social History Narrative  . No narrative on file   Review of Systems Eating okay Weight up a few pounds Sleeps okay with the ambien nightly--finally seems to have gotten used to the 5mg  tab (occ takes an extra 1/4th)    Objective:   Physical Exam  Constitutional: She appears well-developed. No distress.  Neck: Normal range of motion. Neck supple. No thyromegaly present.  Cardiovascular: Normal rate and regular rhythm.  Exam reveals no gallop.   Murmur heard.      Soft systolic murmur at base  Pulmonary/Chest: Effort normal and breath sounds normal. No respiratory distress. She has no wheezes. She has no rales.  Abdominal: Soft. There is no tenderness.  Musculoskeletal: She exhibits no edema and no tenderness.  Lymphadenopathy:    She has no cervical adenopathy.  Psychiatric: She has a normal mood and affect. Her behavior is normal. Thought content normal.           Assessment & Plan:

## 2012-06-02 NOTE — Assessment & Plan Note (Signed)
No problems with the med 

## 2012-06-02 NOTE — Assessment & Plan Note (Signed)
Reasonable control on the paroxetine Needs the zolpidem at night or it really flares

## 2012-06-04 ENCOUNTER — Encounter: Payer: Self-pay | Admitting: *Deleted

## 2012-06-11 ENCOUNTER — Telehealth: Payer: Self-pay

## 2012-06-11 MED ORDER — ZOLPIDEM TARTRATE 5 MG PO TABS: 5.0000 mg | ORAL_TABLET | Freq: Every evening | ORAL | Status: DC | PRN

## 2012-06-11 NOTE — Telephone Encounter (Signed)
Ok to refill Ambien.  

## 2012-06-11 NOTE — Telephone Encounter (Signed)
Yes #30 x 1

## 2012-06-11 NOTE — Telephone Encounter (Signed)
Pt said 4 days ago started with SOB upon exertion, legs are tired, stomach is tight and swollen. Pt feels puffy. Pt has gained 4 lbs this week. No chest pain or dizziness. Today BP 172/88. Last night BP 158/70. Pt stopped Torsemide when Dr Alphonsus Sias advised her to. (pt not sure when stopped).[facility].Please advise. Pt also request refill zolpidem to [facility].

## 2012-06-11 NOTE — Telephone Encounter (Signed)
Please have her start the torsemide 20mg  daily over the next 3 days. 1st dose now If symptoms get worse, will need to seek emergency treatment (in ER)  Please check back with her on Monday. If she is better, we will then decide on dosing for the torsemide (probably every other day)

## 2012-06-11 NOTE — Telephone Encounter (Signed)
Spoke with patient and advised results rx called into pharmacy  

## 2012-06-15 ENCOUNTER — Other Ambulatory Visit: Payer: Self-pay | Admitting: *Deleted

## 2012-06-15 NOTE — Telephone Encounter (Signed)
.  left message to have patient return my call to discuss med refills she wanted sent to [facility].   Ok to refill all these medications?

## 2012-06-16 MED ORDER — APIXABAN 2.5 MG PO TABS: 2.5000 mg | ORAL_TABLET | Freq: Two times a day (BID) | ORAL | Status: DC

## 2012-06-16 MED ORDER — SPIRONOLACTONE 25 MG PO TABS: 25.0000 mg | ORAL_TABLET | Freq: Every day | ORAL | Status: DC

## 2012-06-16 MED ORDER — PANTOPRAZOLE SODIUM 40 MG PO TBEC: 40.0000 mg | DELAYED_RELEASE_TABLET | Freq: Every day | ORAL | Status: DC

## 2012-06-16 MED ORDER — TORSEMIDE 20 MG PO TABS: 20.0000 mg | ORAL_TABLET | Freq: Every day | ORAL | Status: DC

## 2012-06-16 MED ORDER — LISINOPRIL 20 MG PO TABS: 20.0000 mg | ORAL_TABLET | Freq: Every day | ORAL | Status: DC

## 2012-06-16 MED ORDER — PAROXETINE HCL 20 MG PO TABS: 10.0000 mg | ORAL_TABLET | ORAL | Status: DC

## 2012-06-16 MED ORDER — METOPROLOL TARTRATE 50 MG PO TABS: 50.0000 mg | ORAL_TABLET | Freq: Two times a day (BID) | ORAL | Status: DC

## 2012-06-16 MED ORDER — SIMVASTATIN 20 MG PO TABS: 20.0000 mg | ORAL_TABLET | Freq: Every day | ORAL | Status: DC

## 2012-06-16 NOTE — Telephone Encounter (Signed)
rx sent to pharmacy by e-script to [facility], pt had dropped off written forms for each medication.

## 2012-06-16 NOTE — Telephone Encounter (Signed)
Pt returned call about meds sent to mail order pharmacy. Advised pt refills sent in on all meds for 1 year refill.pt voiced understanding.

## 2012-06-16 NOTE — Telephone Encounter (Signed)
Yes All can be done for 1 year

## 2012-06-17 ENCOUNTER — Telehealth: Payer: Self-pay

## 2012-06-17 NOTE — Telephone Encounter (Signed)
Okay but if it takes more than 2 weeks of care, I will want to check the wound myself in the office

## 2012-06-17 NOTE — Telephone Encounter (Signed)
Joan Hull with [facility] wants to know if Dr Alphonsus Sias will do home health orders for dressing changes for pt; pt had procedure at [facility] today due to drainage from stoma; will Dr Alphonsus Sias follow pt for dressing changes.Please advise.

## 2012-06-18 NOTE — Telephone Encounter (Signed)
Left voicemail letting Joan Hull know Dr. Alphonsus Sias gave the okay but if it takes more then 2 weeks to have pt follow up with him in office

## 2012-06-25 ENCOUNTER — Telehealth: Payer: Self-pay

## 2012-06-25 NOTE — Telephone Encounter (Signed)
Spoke with Dottie and advised results

## 2012-06-25 NOTE — Telephone Encounter (Signed)
Dottie with [facility]; saw pt 06/24/12 for wound care where had open procedure; pt is almost independent with wound care, able to drive,able to take care of ADL and maintain household.Would like to reduce frequency to once a week for one more week and if independent and no signs of infection pt would be discharged due to no home bound status. Please give verbal order OK to decrease frequency to once a week for one more week. At pt's next visit with Dr Alphonsus Sias pt request to address colonoscopy,mammogram and is pt to continue taking Torsemide.

## 2012-06-25 NOTE — Telephone Encounter (Signed)
Okay to decrease frequency as proposed

## 2012-06-28 ENCOUNTER — Telehealth: Payer: Self-pay

## 2012-06-28 NOTE — Telephone Encounter (Signed)
Patient called back and stated that she can't afford a 10 day rx for Eliquis, pt stated that she paid $175.00 for the mail-order rx for Eliquis. I asked pt to check with [facility] to see how much it will be and let us know. Pt will call back tomorrow.

## 2012-06-28 NOTE — Telephone Encounter (Signed)
Spoke with patient and advised we do not have samples of eliquis, pt would like a 10 day supply of coumadin called in if possible until her mail order rx arrives. I advised pt she would need to have her PT/INR checked and per pt she only needs this for 10 days. Please advise

## 2012-06-28 NOTE — Telephone Encounter (Signed)
Using coumadin for 10 days will not protect her  She needs to stick with the eliquis----- send 10 or 30 day Rx locally (depending on what the expense is)

## 2012-06-28 NOTE — Telephone Encounter (Signed)
Pt has not gotten med from [facility] yet and pt request samples of eloquis.Please advise.

## 2012-06-28 NOTE — Telephone Encounter (Signed)
.  left message to have patient return my call.  

## 2012-06-29 DIAGNOSIS — I10 Essential (primary) hypertension: Secondary | ICD-10-CM

## 2012-06-29 DIAGNOSIS — T8189XA Other complications of procedures, not elsewhere classified, initial encounter: Secondary | ICD-10-CM

## 2012-06-29 DIAGNOSIS — I4891 Unspecified atrial fibrillation: Secondary | ICD-10-CM

## 2012-06-29 DIAGNOSIS — Z951 Presence of aortocoronary bypass graft: Secondary | ICD-10-CM

## 2012-06-29 DIAGNOSIS — D649 Anemia, unspecified: Secondary | ICD-10-CM

## 2012-06-29 NOTE — Telephone Encounter (Signed)
Patient did get rx filled locally which cost $70.00, per pt this was a nightmare.

## 2012-07-01 ENCOUNTER — Telehealth: Payer: Self-pay

## 2012-07-01 NOTE — Telephone Encounter (Signed)
Noted  

## 2012-07-01 NOTE — Telephone Encounter (Signed)
Joni Reining nurse with [facility] left v/m pt discharged from home health services; pt able to change own dressing now.

## 2012-07-05 ENCOUNTER — Other Ambulatory Visit: Payer: Self-pay | Admitting: *Deleted

## 2012-07-05 MED ORDER — TORSEMIDE 20 MG PO TABS: 20.0000 mg | ORAL_TABLET | Freq: Every day | ORAL | Status: DC

## 2012-07-29 ENCOUNTER — Ambulatory Visit: Payer: Self-pay | Admitting: Orthopedic Surgery

## 2012-08-09 ENCOUNTER — Other Ambulatory Visit: Payer: Self-pay | Admitting: *Deleted

## 2012-08-09 MED ORDER — ZOLPIDEM TARTRATE 5 MG PO TABS: 5.0000 mg | ORAL_TABLET | Freq: Every evening | ORAL | Status: DC | PRN

## 2012-08-09 NOTE — Telephone Encounter (Signed)
Okay #30 x 1 

## 2012-08-09 NOTE — Telephone Encounter (Signed)
rx called into pharmacy

## 2012-10-08 ENCOUNTER — Other Ambulatory Visit: Payer: Self-pay | Admitting: *Deleted

## 2012-10-08 MED ORDER — ZOLPIDEM TARTRATE 5 MG PO TABS: 5.0000 mg | ORAL_TABLET | Freq: Every evening | ORAL | Status: DC | PRN

## 2012-10-08 NOTE — Telephone Encounter (Signed)
Last filled 09/09/12

## 2012-10-08 NOTE — Telephone Encounter (Signed)
Okay #30 x 1 

## 2012-10-08 NOTE — Telephone Encounter (Signed)
Pt left v/m requesting refill zolpidem to [facility].Please advise.

## 2012-10-08 NOTE — Telephone Encounter (Signed)
rx called into pharmacy

## 2012-12-01 ENCOUNTER — Encounter: Payer: Self-pay | Admitting: Internal Medicine

## 2012-12-01 ENCOUNTER — Ambulatory Visit (INDEPENDENT_AMBULATORY_CARE_PROVIDER_SITE_OTHER): Payer: Medicare Other | Admitting: Internal Medicine

## 2012-12-01 VITALS — BP 120/70 | HR 60 | Temp 98.0°F | Wt 104.0 lb

## 2012-12-01 DIAGNOSIS — R140 Abdominal distension (gaseous): Secondary | ICD-10-CM

## 2012-12-01 DIAGNOSIS — I4891 Unspecified atrial fibrillation: Secondary | ICD-10-CM

## 2012-12-01 DIAGNOSIS — R141 Gas pain: Secondary | ICD-10-CM

## 2012-12-01 DIAGNOSIS — F411 Generalized anxiety disorder: Secondary | ICD-10-CM

## 2012-12-01 DIAGNOSIS — I2510 Atherosclerotic heart disease of native coronary artery without angina pectoris: Secondary | ICD-10-CM

## 2012-12-01 DIAGNOSIS — K219 Gastro-esophageal reflux disease without esophagitis: Secondary | ICD-10-CM

## 2012-12-01 LAB — BASIC METABOLIC PANEL
BUN: 35 mg/dL — ABNORMAL HIGH (ref 6–23)
Calcium: 9.6 mg/dL (ref 8.4–10.5)
Chloride: 99 mEq/L (ref 96–112)
Creatinine, Ser: 1 mg/dL (ref 0.4–1.2)

## 2012-12-01 LAB — HEPATIC FUNCTION PANEL
AST: 21 U/L (ref 0–37)
Bilirubin, Direct: 0 mg/dL (ref 0.0–0.3)
Total Bilirubin: 0.7 mg/dL (ref 0.3–1.2)

## 2012-12-01 LAB — LIPID PANEL
Cholesterol: 226 mg/dL — ABNORMAL HIGH (ref 0–200)
HDL: 52.2 mg/dL (ref >39.00)
Total CHOL/HDL Ratio: 4
Triglycerides: 172 mg/dL — ABNORMAL HIGH (ref 0.0–149.0)
VLDL: 34.4 mg/dL (ref 0.0–40.0)

## 2012-12-01 LAB — CBC WITH DIFFERENTIAL/PLATELET
Eosinophils Absolute: 0.1 10*3/uL (ref 0.0–0.7)
Eosinophils Relative: 2 % (ref 0.0–5.0)
Lymphocytes Relative: 28.6 % (ref 12.0–46.0)
MCV: 93.3 fl (ref 78.0–100.0)
Monocytes Absolute: 0.9 10*3/uL (ref 0.1–1.0)
Neutrophils Relative %: 53.6 % (ref 43.0–77.0)
Platelets: 239 10*3/uL (ref 150.0–400.0)
WBC: 5.6 10*3/uL (ref 4.5–10.5)

## 2012-12-01 LAB — LDL CHOLESTEROL, DIRECT: Direct LDL: 143 mg/dL

## 2012-12-01 LAB — TSH: TSH: 0.7 u[IU]/mL (ref 0.35–5.50)

## 2012-12-01 NOTE — Assessment & Plan Note (Signed)
Not prominent but given her sister's recent death from ovarian cancer, will proceed with pelvic ultrasound

## 2012-12-01 NOTE — Patient Instructions (Signed)
Please weigh yourself daily. Take the torsemide only if you are clearly holding onto fluid or if your weight is up 305# from your usual

## 2012-12-01 NOTE — Addendum Note (Signed)
Addended by: Sueanne Margarita on: 12/01/2012 12:59 PM   Modules accepted: Orders

## 2012-12-01 NOTE — Addendum Note (Signed)
Addended by: Tillman Abide I on: 12/01/2012 12:25 PM   Modules accepted: Orders

## 2012-12-01 NOTE — Progress Notes (Signed)
Subjective:    Patient ID: Joan Hull, female    DOB: 17-Aug-1926, 79 y.o.   MRN: [id number]  HPI Doing fairly well  Still has some seepage from sternal site Surgeon went back in and revised it---home with some wound vacs but still not healed up No pain Did get antibiotic once  No chest pain No dyspnea Stays active as usual Does note some fluid accumulation in abdomen or legs at times Was told by cardiologist to take demadex daily but hasn't been Does monitor weight  Worried about her abdomen Just lost sister to ovarian cancer and she started with bloating No urinary symptoms  Worried about "shingles"--recurrent pain and rash along right hip Resolved again now  Nerves are okay No depression or anhedonia Still needs the zolpidem to sleep  Only rarely feels a palpitation Pacer in  Current Outpatient Prescriptions on File Prior to Visit  Medication Sig Dispense Refill  . apixaban (ELIQUIS) 2.5 MG TABS tablet Take 1 tablet (2.5 mg total) by mouth 2 (two) times daily.  180 tablet  3  . aspirin 81 MG tablet Take 81 mg by mouth daily.        Marland Kitchen lisinopril (PRINIVIL,ZESTRIL) 20 MG tablet Take 1 tablet (20 mg total) by mouth daily.  90 tablet  3  . metoprolol (LOPRESSOR) 50 MG tablet Take 1 tablet (50 mg total) by mouth 2 (two) times daily.  180 tablet  3  . pantoprazole (PROTONIX) 40 MG tablet Take 1 tablet (40 mg total) by mouth daily.  90 tablet  3  . PARoxetine (PAXIL) 20 MG tablet Take 0.5 tablets (10 mg total) by mouth every morning.  90 tablet  3  . torsemide (DEMADEX) 20 MG tablet Take 1 tablet (20 mg total) by mouth daily.  30 tablet  0  . zolpidem (AMBIEN) 5 MG tablet Take 1 tablet (5 mg total) by mouth at bedtime as needed.  30 tablet  1   No current facility-administered medications on file prior to visit.    Allergies  Allergen Reactions  . Aspirin   . Diazepam     REACTION: worsened bladder problems    Past Medical History  Diagnosis Date  . Anxiety    . Depression   . Hyperlipidemia   . Arthritis   . Osteoporosis   . GERD (gastroesophageal reflux disease)   . CAD (coronary artery disease)   . Interstitial cystitis   . Hx of colonic polyps   . Atrial fibrillation   . Non Q wave myocardial infarction 06/27/11    [facility]    Past Surgical History  Procedure Laterality Date  . Cataract extraction  2005  . Coronary stent placement  7/12    RCA with drug eluting stent---[name]  . Coronary artery bypass graft  1/13    [facility]  . Pacemaker insertion  5/13    [facility]    Family History  Problem Relation Age of Onset  . Heart disease Mother     cad  . Hypertension Mother   . Heart disease Father     heart attack  . Heart disease Sister     half sister MI  . Heart disease Brother     cabg  . Heart disease Brother   . Cancer Brother     BLADDER  . Cancer Sister     BRAIN  . Cancer Sister     OVARIAN    History   Social History  . Marital Status: Widowed  Spouse Name: N/A    Number of Children: 1  . Years of Education: N/A   Occupational History  . RETIRED ([employer])    Social History Main Topics  . Smoking status: Former Games developer  . Smokeless tobacco: Never Used  . Alcohol Use: No  . Drug Use: No  . Sexually Active: Not on file   Other Topics Concern  . Not on file   Social History Narrative  . No narrative on file   Review of Systems Has had some back pain--one morning so bad she could barely get out of bed MRI showed 2 crushed vertebrae-- seen at [facility] urgent care Given hydrocodone and it did go away    Objective:   Physical Exam  Constitutional: She appears well-developed and well-nourished. No distress.  Neck: Normal range of motion. Neck supple. No thyromegaly present.  Cardiovascular: Normal rate, regular rhythm and normal heart sounds.  Exam reveals no gallop.   No murmur heard. Pulmonary/Chest: Effort normal and breath sounds normal. No respiratory distress. She has no wheezes.  She has no rales.  Abdominal: Soft. There is no tenderness.  Musculoskeletal: She exhibits no edema.  Lymphadenopathy:    She has no cervical adenopathy.  Skin:  Small cyst to right of lower thoracic spine  Psychiatric: She has a normal mood and affect. Her behavior is normal.          Assessment & Plan:

## 2012-12-01 NOTE — Assessment & Plan Note (Signed)
Has done well since CABG except for small slightly draining area still in mid sternum incision

## 2012-12-01 NOTE — Assessment & Plan Note (Signed)
Paced On eliquis 

## 2012-12-01 NOTE — Assessment & Plan Note (Signed)
Chronic anxiety No depression Stable on paxil--- zolpidem for sleep

## 2012-12-01 NOTE — Assessment & Plan Note (Signed)
Didn't do well off the PPI

## 2012-12-06 ENCOUNTER — Ambulatory Visit: Payer: Self-pay | Admitting: Internal Medicine

## 2012-12-06 ENCOUNTER — Encounter: Payer: Self-pay | Admitting: Internal Medicine

## 2012-12-06 ENCOUNTER — Encounter: Payer: Self-pay | Admitting: *Deleted

## 2012-12-08 ENCOUNTER — Other Ambulatory Visit: Payer: Self-pay | Admitting: *Deleted

## 2012-12-08 MED ORDER — ZOLPIDEM TARTRATE 5 MG PO TABS: 5.0000 mg | ORAL_TABLET | Freq: Every evening | ORAL | Status: DC | PRN

## 2012-12-08 NOTE — Telephone Encounter (Signed)
Last filled 11/06/12

## 2012-12-08 NOTE — Telephone Encounter (Signed)
rx called into pharmacy

## 2012-12-08 NOTE — Telephone Encounter (Signed)
Okay #30 x 1 

## 2012-12-08 NOTE — Telephone Encounter (Signed)
Pt left v/m requesting refill zolpidem to [facility].Please advise.

## 2012-12-09 ENCOUNTER — Other Ambulatory Visit: Payer: Self-pay | Admitting: Internal Medicine

## 2012-12-09 DIAGNOSIS — R935 Abnormal findings on diagnostic imaging of other abdominal regions, including retroperitoneum: Secondary | ICD-10-CM

## 2013-02-07 ENCOUNTER — Other Ambulatory Visit: Payer: Self-pay | Admitting: *Deleted

## 2013-02-07 MED ORDER — ZOLPIDEM TARTRATE 5 MG PO TABS: 5.0000 mg | ORAL_TABLET | Freq: Every evening | ORAL | Status: DC | PRN

## 2013-02-07 NOTE — Telephone Encounter (Signed)
Okay #30 x 1 

## 2013-02-07 NOTE — Telephone Encounter (Signed)
Left message on machine that rx was called to the pharmacy

## 2013-04-07 ENCOUNTER — Other Ambulatory Visit: Payer: Self-pay | Admitting: *Deleted

## 2013-04-07 MED ORDER — ZOLPIDEM TARTRATE 5 MG PO TABS: 5.0000 mg | ORAL_TABLET | Freq: Every evening | ORAL | Status: DC | PRN

## 2013-04-07 NOTE — Telephone Encounter (Signed)
rx called into pharmacy

## 2013-04-07 NOTE — Telephone Encounter (Signed)
Last filled 03/08/13 

## 2013-04-07 NOTE — Telephone Encounter (Signed)
Okay #30 x 0 

## 2013-04-26 ENCOUNTER — Other Ambulatory Visit: Payer: Self-pay | Admitting: *Deleted

## 2013-04-26 MED ORDER — APIXABAN 2.5 MG PO TABS: 2.5000 mg | ORAL_TABLET | Freq: Two times a day (BID) | ORAL | Status: DC

## 2013-05-06 ENCOUNTER — Other Ambulatory Visit: Payer: Self-pay | Admitting: *Deleted

## 2013-05-06 MED ORDER — ZOLPIDEM TARTRATE 5 MG PO TABS: 5.0000 mg | ORAL_TABLET | Freq: Every evening | ORAL | Status: DC | PRN

## 2013-05-06 NOTE — Telephone Encounter (Signed)
Pt called to ck on status of refill; Medication phoned to [facility] pharmacy as instructed.Joan Hull advised pt done.

## 2013-05-06 NOTE — Telephone Encounter (Signed)
Px written for call in   

## 2013-05-06 NOTE — Telephone Encounter (Signed)
Last filled 04/07/13 

## 2013-05-17 ENCOUNTER — Other Ambulatory Visit: Payer: Self-pay | Admitting: *Deleted

## 2013-05-17 MED ORDER — PANTOPRAZOLE SODIUM 40 MG PO TBEC: 40.0000 mg | DELAYED_RELEASE_TABLET | Freq: Every day | ORAL | Status: DC

## 2013-06-06 ENCOUNTER — Other Ambulatory Visit: Payer: Self-pay

## 2013-06-06 NOTE — Telephone Encounter (Signed)
Pt request refill zolpidem to [facility]; pt still has one pill left but pt is going in that area this afternoon and request med called in today if possible.Please advise.

## 2013-06-07 MED ORDER — ZOLPIDEM TARTRATE 5 MG PO TABS: 5.0000 mg | ORAL_TABLET | Freq: Every evening | ORAL | Status: DC | PRN

## 2013-06-07 NOTE — Telephone Encounter (Signed)
Okay #30 x 0  She is due for a follow up--have her set up an appt

## 2013-06-07 NOTE — Telephone Encounter (Signed)
rx called into pharmacy .left message to have patient return my call.  

## 2013-07-06 ENCOUNTER — Other Ambulatory Visit: Payer: Self-pay

## 2013-07-06 NOTE — Telephone Encounter (Signed)
Pt left v/m that she only has one pill left of zolpidem for tonight and pt request refill called to [facility]. Please advise.

## 2013-07-07 MED ORDER — ZOLPIDEM TARTRATE 5 MG PO TABS: 5.0000 mg | ORAL_TABLET | Freq: Every evening | ORAL | Status: DC | PRN

## 2013-07-07 NOTE — Telephone Encounter (Signed)
Pt called re; status of refill for zolpidem.Medication phoned to [facility] pharmacy as instructed. Pt advised done.

## 2013-07-07 NOTE — Telephone Encounter (Signed)
Okay #30 x 0 

## 2013-07-13 ENCOUNTER — Telehealth: Payer: Self-pay

## 2013-07-13 NOTE — Telephone Encounter (Signed)
Pt left v/m; pt has enough Eliquis to last until 08/04/13; pt is taking eliquis one tab twice a day; pt wants to know if could cut back on dosage so she could save some money. Pt request Dr Karle Starch advice.Please advise.

## 2013-07-14 NOTE — Telephone Encounter (Signed)
No, she really needs the current dose. They do make a 5mg  tablet---I don't know if it would be possible, or cheaper, to take 1/2 of that bid

## 2013-07-18 ENCOUNTER — Telehealth: Payer: Self-pay

## 2013-07-18 NOTE — Telephone Encounter (Signed)
Pt scheduled an appt tomorrow to discuss
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
## 2013-07-18 NOTE — Telephone Encounter (Signed)
Spoke with patient and advised results   

## 2013-07-18 NOTE — Telephone Encounter (Signed)
No, she really can't spread out the dose or add extra aspirin. Please ask if she has the $400. If she doesn't, we will have to figure out something to do (like switch back to warfarin) If she has it, have her buy the eliquis for this month (since there is only 1 month till the new year)

## 2013-07-19 ENCOUNTER — Ambulatory Visit (INDEPENDENT_AMBULATORY_CARE_PROVIDER_SITE_OTHER): Payer: Medicare Other | Admitting: Internal Medicine

## 2013-07-19 ENCOUNTER — Encounter: Payer: Self-pay | Admitting: Internal Medicine

## 2013-07-19 VITALS — BP 110/68 | HR 60 | Temp 97.8°F | Ht 58.25 in | Wt 104.5 lb

## 2013-07-19 DIAGNOSIS — F39 Unspecified mood [affective] disorder: Secondary | ICD-10-CM

## 2013-07-19 DIAGNOSIS — I4891 Unspecified atrial fibrillation: Secondary | ICD-10-CM

## 2013-07-19 DIAGNOSIS — I2510 Atherosclerotic heart disease of native coronary artery without angina pectoris: Secondary | ICD-10-CM
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
## 2013-07-19 NOTE — Progress Notes (Signed)
Pre-visit discussion using our clinic review tool. No additional management support is needed unless otherwise documented below in the visit note.  

## 2013-07-19 NOTE — Assessment & Plan Note (Signed)
Doing fine since the CABG Did have sternal wires removed and now wound is finally healing

## 2013-07-19 NOTE — Assessment & Plan Note (Signed)
Mild anxiety Controlled with paroxetine  DMV form done

## 2013-07-19 NOTE — Assessment & Plan Note (Signed)
Paced On apixaban 

## 2013-08-04 ENCOUNTER — Other Ambulatory Visit: Payer: Self-pay | Admitting: *Deleted

## 2013-08-04 MED ORDER — ZOLPIDEM TARTRATE 5 MG PO TABS: 5.0000 mg | ORAL_TABLET | Freq: Every evening | ORAL | Status: DC | PRN

## 2013-08-04 MED ORDER — APIXABAN 2.5 MG PO TABS: 2.5000 mg | ORAL_TABLET | Freq: Two times a day (BID) | ORAL | Status: DC

## 2013-08-04 NOTE — Telephone Encounter (Signed)
I think she needs ongoing apixaban so send for a year  Okay zolpidem #30 x 0

## 2013-08-04 NOTE — Telephone Encounter (Signed)
Pt needs a 30 day supply of Eliquis and her Remus Loffler was last refilled 07/06/13, ok to fill?

## 2013-08-04 NOTE — Telephone Encounter (Signed)
rx sent to pharmacy by e-script rx called into pharmacy  

## 2013-08-08 ENCOUNTER — Telehealth: Payer: Self-pay

## 2013-08-08 NOTE — Telephone Encounter (Signed)
Please check on her tomorrow 

## 2013-08-08 NOTE — Telephone Encounter (Signed)
Pt having same back pain had one year ago when say orthopedic dr. Pt having lower and mid back pain. Advised pt would need to be seen for evaluation. Offered appt for pt but she said her neighbor would bring her and our office was too far for her to drive and pt will go to [facility]. Pt to cb if needed.
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
## 2013-08-10 NOTE — Telephone Encounter (Signed)
Molli Knock Glad to hear she is better

## 2013-08-19 NOTE — Telephone Encounter (Signed)
Pt called and wants 1 yr refills with 90 day rx sent to [facility]; pt will ck with [facility] to see what pharmacy med should be sent to;pt did not pick up 30 day rx apixaban that was sent because would cost pt over $100.00. Pt will cb with confirmed pharmacy pt wants med sent to.

## 2013-08-22 ENCOUNTER — Telehealth: Payer: Self-pay

## 2013-08-22 ENCOUNTER — Other Ambulatory Visit: Payer: Self-pay

## 2013-08-22 MED ORDER — APIXABAN 2.5 MG PO TABS: 2.5000 mg | ORAL_TABLET | Freq: Two times a day (BID) | ORAL | Status: DC

## 2013-08-22 NOTE — Telephone Encounter (Signed)
rx sent to pharmacy by e-script  

## 2013-08-22 NOTE — Telephone Encounter (Signed)
Pt said changed insurance co. And [facility] is coverage for medications; pt is not sure what mail order pharmacy is associated with [facility]. Pt will get info and call back.

## 2013-08-22 NOTE — Telephone Encounter (Signed)
Okay to fill for a year

## 2013-08-22 NOTE — Telephone Encounter (Signed)
Ok to send

## 2013-08-22 NOTE — Telephone Encounter (Signed)
Pt left v/m requesting 90 day rx of eliquis to [facility]; pt has changed insurance to [facility] and mail order is less expensive.Please advise.

## 2013-09-02 ENCOUNTER — Other Ambulatory Visit: Payer: Self-pay | Admitting: Internal Medicine

## 2013-09-02 NOTE — Telephone Encounter (Signed)
Last filled 08/04/13

## 2013-09-03 NOTE — Telephone Encounter (Signed)
Okay #30 x 0 

## 2013-09-05 NOTE — Telephone Encounter (Signed)
Called to [facility].

## 2013-09-14 ENCOUNTER — Other Ambulatory Visit: Payer: Self-pay | Admitting: Internal Medicine

## 2013-09-14 NOTE — Telephone Encounter (Signed)
Pt left voicemail with triage, pt is requesting Rx refilled to mail order pharmacy  Pt is also requesting a refill of omeprazole, it's not on med list

## 2013-09-15 MED ORDER — PANTOPRAZOLE SODIUM 40 MG PO TBEC: 40.0000 mg | DELAYED_RELEASE_TABLET | Freq: Every day | ORAL | Status: DC

## 2013-09-15 MED ORDER — PAROXETINE HCL 20 MG PO TABS: 10.0000 mg | ORAL_TABLET | ORAL | Status: DC

## 2013-09-15 NOTE — Telephone Encounter (Signed)
Spoke with patient and advised results rx sent to pharmacy by e-script  

## 2013-09-26 ENCOUNTER — Telehealth: Payer: Self-pay | Admitting: Internal Medicine

## 2013-09-26 MED ORDER — PANTOPRAZOLE SODIUM 40 MG PO TBEC: 40.0000 mg | DELAYED_RELEASE_TABLET | Freq: Every day | ORAL | Status: DC

## 2013-09-26 NOTE — Telephone Encounter (Signed)
Pt is needing refill on Pantopizole 40 mg ???. [facility] [place]

## 2013-09-26 NOTE — Telephone Encounter (Signed)
Pt said wants 90 day supply of pantoprazole sent to [place]. Pt said [facility] wants forms filled out before will send med so pt has decided to use local pharmacy. Advised done. Pt said she has notified mail order not to send med.

## 2013-10-03 ENCOUNTER — Other Ambulatory Visit: Payer: Self-pay | Admitting: *Deleted

## 2013-10-03 ENCOUNTER — Other Ambulatory Visit: Payer: Self-pay | Admitting: Internal Medicine

## 2013-10-03 MED ORDER — ZOLPIDEM TARTRATE 5 MG PO TABS: 5.0000 mg | ORAL_TABLET | Freq: Every evening | ORAL | Status: DC | PRN

## 2013-10-03 NOTE — Telephone Encounter (Signed)
rx called into pharmacy

## 2013-10-03 NOTE — Telephone Encounter (Signed)
Pt already called about this!!

## 2013-10-03 NOTE — Telephone Encounter (Signed)
Okay #30 x 0 

## 2013-10-03 NOTE — Telephone Encounter (Signed)
Last filled 06/23/14

## 2013-10-19 LAB — CK TOTAL AND CKMB (NOT AT ARMC)
CK, TOTAL: 67 U/L
CK, Total: 52 U/L
CK, Total: 52 U/L
CK-MB: 1.7 ng/mL (ref 0.5–3.6)
CK-MB: 4.5 ng/mL — ABNORMAL HIGH (ref 0.5–3.6)
CK-MB: 5.8 ng/mL — AB (ref 0.5–3.6)

## 2013-10-19 LAB — BASIC METABOLIC PANEL
ANION GAP: 8 (ref 7–16)
BUN: 28 mg/dL — ABNORMAL HIGH (ref 7–18)
CO2: 25 mmol/L (ref 21–32)
Calcium, Total: 9.6 mg/dL (ref 8.5–10.1)
Chloride: 106 mmol/L (ref 98–107)
Creatinine: 1.02 mg/dL (ref 0.60–1.30)
EGFR (African American): 58 — ABNORMAL LOW
EGFR (Non-African Amer.): 50 — ABNORMAL LOW
Glucose: 151 mg/dL — ABNORMAL HIGH (ref 65–99)
Osmolality: 286 (ref 275–301)
Potassium: 4.1 mmol/L (ref 3.5–5.1)
SODIUM: 139 mmol/L (ref 136–145)

## 2013-10-19 LAB — CK-MB: CK-MB: 5.4 ng/mL — ABNORMAL HIGH (ref 0.5–3.6)

## 2013-10-19 LAB — CBC
HCT: 33.3 % — ABNORMAL LOW (ref 35.0–47.0)
HGB: 11.3 g/dL — ABNORMAL LOW (ref 12.0–16.0)
MCH: 30.5 pg (ref 26.0–34.0)
MCHC: 33.8 g/dL (ref 32.0–36.0)
MCV: 90 fL (ref 80–100)
Platelet: 171 10*3/uL (ref 150–440)
RBC: 3.7 10*6/uL — AB (ref 3.80–5.20)
RDW: 14.2 % (ref 11.5–14.5)
WBC: 4.5 10*3/uL (ref 3.6–11.0)

## 2013-10-19 LAB — TROPONIN I
TROPONIN-I: 0.91 ng/mL — AB
TROPONIN-I: 0.71 ng/mL — AB
Troponin-I: 0.02 ng/mL
Troponin-I: 0.74 ng/mL — ABNORMAL HIGH

## 2013-10-19 LAB — PROTIME-INR
INR: 1
Prothrombin Time: 12.6 secs (ref 11.5–14.7)

## 2013-10-20 ENCOUNTER — Inpatient Hospital Stay: Payer: Self-pay | Admitting: Internal Medicine

## 2013-10-20 LAB — CBC WITH DIFFERENTIAL/PLATELET
BASOS ABS: 0 10*3/uL (ref 0.0–0.1)
Basophil %: 0.6 %
EOS PCT: 6.3 %
Eosinophil #: 0.3 10*3/uL (ref 0.0–0.7)
HCT: 32 % — ABNORMAL LOW (ref 35.0–47.0)
HGB: 10.8 g/dL — ABNORMAL LOW (ref 12.0–16.0)
LYMPHS PCT: 22.5 %
Lymphocyte #: 1.1 10*3/uL (ref 1.0–3.6)
MCH: 30.2 pg (ref 26.0–34.0)
MCHC: 33.7 g/dL (ref 32.0–36.0)
MCV: 90 fL (ref 80–100)
Monocyte #: 0.7 x10 3/mm (ref 0.2–0.9)
Monocyte %: 15.1 %
Neutrophil #: 2.6 10*3/uL (ref 1.4–6.5)
Neutrophil %: 55.5 %
Platelet: 169 10*3/uL (ref 150–440)
RBC: 3.57 10*6/uL — ABNORMAL LOW (ref 3.80–5.20)
RDW: 14.2 % (ref 11.5–14.5)
WBC: 4.8 10*3/uL (ref 3.6–11.0)

## 2013-10-20 LAB — BASIC METABOLIC PANEL
ANION GAP: 4 — AB (ref 7–16)
BUN: 29 mg/dL — ABNORMAL HIGH (ref 7–18)
CO2: 26 mmol/L (ref 21–32)
CREATININE: 1.03 mg/dL (ref 0.60–1.30)
Calcium, Total: 9 mg/dL (ref 8.5–10.1)
Chloride: 110 mmol/L — ABNORMAL HIGH (ref 98–107)
EGFR (African American): 57 — ABNORMAL LOW
GFR CALC NON AF AMER: 50 — AB
GLUCOSE: 90 mg/dL (ref 65–99)
Osmolality: 285 (ref 275–301)
Potassium: 4.2 mmol/L (ref 3.5–5.1)
SODIUM: 140 mmol/L (ref 136–145)

## 2013-10-20 LAB — MAGNESIUM: MAGNESIUM: 2 mg/dL

## 2013-10-21 LAB — LIPID PANEL
Cholesterol: 184 mg/dL (ref 0–200)
HDL Cholesterol: 64 mg/dL — ABNORMAL HIGH (ref 40–60)
Ldl Cholesterol, Calc: 98 mg/dL (ref 0–100)
TRIGLYCERIDES: 112 mg/dL (ref 0–200)
VLDL Cholesterol, Calc: 22 mg/dL (ref 5–40)

## 2013-11-01 ENCOUNTER — Other Ambulatory Visit: Payer: Self-pay | Admitting: Internal Medicine

## 2013-11-01 NOTE — Telephone Encounter (Signed)
10/03/13 

## 2013-11-02 ENCOUNTER — Encounter: Payer: Self-pay | Admitting: Radiology

## 2013-11-02 NOTE — Telephone Encounter (Signed)
rx called into pharmacy

## 2013-11-02 NOTE — Telephone Encounter (Signed)
Okay #30 x 0 

## 2013-11-03 ENCOUNTER — Ambulatory Visit (INDEPENDENT_AMBULATORY_CARE_PROVIDER_SITE_OTHER): Payer: Medicare Other | Admitting: Internal Medicine

## 2013-11-03 ENCOUNTER — Encounter: Payer: Self-pay | Admitting: Internal Medicine

## 2013-11-03 VITALS — BP 116/64 | HR 59 | Temp 97.5°F | Wt 105.2 lb

## 2013-11-03 DIAGNOSIS — I4891 Unspecified atrial fibrillation: Secondary | ICD-10-CM

## 2013-11-03 DIAGNOSIS — I2510 Atherosclerotic heart disease of native coronary artery without angina pectoris: Secondary | ICD-10-CM

## 2013-11-03 NOTE — Assessment & Plan Note (Signed)
High risk myocardial perfusion scan Decision made not to do cath (I agree!) On ACEI, beta blocker and ASA Had bad experience with a statin---I recommended trying another but she is not ready to

## 2013-11-03 NOTE — Progress Notes (Signed)
Subjective:    Patient ID: Joan Hull, female    DOB: 1925/11/21, 80 y.o.   MRN: [id number]  HPI Admitted to [facility] a couple of weeks ago for some chest and left arm pain and pounding  Only partial improvement with the nitro Atrial fib was going fast No MI Had high risk myocardial scan and cath was considered but decided against Dr Ubaldo Glassing did see her at the end and decided not   No problems since coming home May feel a little flutter but no heart racing No SOB--but still limited in activity (DOE is fairly stable)  Current Outpatient Prescriptions on File Prior to Visit  Medication Sig Dispense Refill  . apixaban (ELIQUIS) 2.5 MG TABS tablet Take 1 tablet (2.5 mg total) by mouth 2 (two) times daily.  180 tablet  1  . BIOTIN PO Take by mouth daily.      . Calcium Citrate-Vitamin D (CITRACAL + D PO) Take by mouth daily.      Marland Kitchen lisinopril (PRINIVIL,ZESTRIL) 20 MG tablet Take 10 mg by mouth daily.      . metoprolol (LOPRESSOR) 50 MG tablet Take 25 mg by mouth daily.      . pantoprazole (PROTONIX) 40 MG tablet Take 1 tablet (40 mg total) by mouth daily.  90 tablet  3  . PARoxetine (PAXIL) 20 MG tablet Take 0.5 tablets (10 mg total) by mouth every morning.  90 tablet  3  . Probiotic Product (PROBIOTIC DAILY PO) Take by mouth daily.      Marland Kitchen torsemide (DEMADEX) 20 MG tablet Take 10 mg by mouth daily.      Marland Kitchen zolpidem (AMBIEN) 5 MG tablet TAKE 1 TABLET AT BEDTIME AS NEEDED  30 tablet  0  . aspirin 81 MG tablet Take 81 mg by mouth daily.         No current facility-administered medications on file prior to visit.    Allergies  Allergen Reactions  . Aspirin   . Diazepam     REACTION: worsened bladder problems    Past Medical History  Diagnosis Date  . Anxiety   . Depression   . Hyperlipidemia   . Arthritis   . Osteoporosis   . GERD (gastroesophageal reflux disease)   . CAD (coronary artery disease)   . Interstitial cystitis   . Hx of colonic polyps   . Atrial fibrillation     . Non Q wave myocardial infarction 06/27/11    [facility]    Past Surgical History  Procedure Laterality Date  . Cataract extraction  2005  . Coronary stent placement  7/12    RCA with drug eluting stent---[name]  . Coronary artery bypass graft  1/13    [facility]  . Pacemaker insertion  5/13    [facility]    Family History  Problem Relation Age of Onset  . Heart disease Mother     cad  . Hypertension Mother   . Heart disease Father     heart attack  . Heart disease Sister     half sister MI  . Heart disease Brother     cabg  . Heart disease Brother   . Cancer Brother     BLADDER  . Cancer Sister     BRAIN  . Cancer Sister     OVARIAN    History   Social History  . Marital Status: Widowed    Spouse Name: N/A    Number of Children: 1  . Years  of Education: N/A   Occupational History  . RETIRED ([employer])    Social History Main Topics  . Smoking status: Former Research scientist (life sciences)  . Smokeless tobacco: Never Used  . Alcohol Use: No  . Drug Use: No  . Sexual Activity: Not on file   Other Topics Concern  . Not on file   Social History Narrative  . No narrative on file   Review of Systems Appetite is great Weight stable Sleeps fine--but needs the ambien regularly Occasional abdominal bloating (if she skips the diuretic)     Objective:   Physical Exam  Constitutional: She appears well-developed and well-nourished. No distress.  Neck: Normal range of motion. Neck supple. No thyromegaly present.  Cardiovascular: Normal rate and regular rhythm.  Exam reveals no gallop.   Soft systolic murmur towards base  Pulmonary/Chest: Effort normal and breath sounds normal. No respiratory distress. She has no wheezes. She has no rales.  Musculoskeletal: She exhibits no edema.  Lymphadenopathy:    She has no cervical adenopathy.  Psychiatric: She has a normal mood and affect. Her behavior is normal.          Assessment & Plan:

## 2013-11-03 NOTE — Progress Notes (Signed)
Pre visit review using our clinic review tool, if applicable. No additional management support is needed unless otherwise documented below in the visit note. 

## 2013-11-03 NOTE — Assessment & Plan Note (Signed)
Had rapid rate requiring hospitalization Back to paced rhythm now No changes

## 2013-11-07 ENCOUNTER — Other Ambulatory Visit: Payer: Self-pay

## 2013-11-07 MED ORDER — METOPROLOL TARTRATE 50 MG PO TABS: 25.0000 mg | ORAL_TABLET | Freq: Every day | ORAL | Status: DC

## 2013-11-07 MED ORDER — LISINOPRIL 20 MG PO TABS: 10.0000 mg | ORAL_TABLET | Freq: Every day | ORAL | Status: DC

## 2013-11-07 NOTE — Telephone Encounter (Signed)
Okay to refill both for a year

## 2013-11-07 NOTE — Telephone Encounter (Signed)
Pt request refill on lisinopril and metoprolol to [facility];pt is not sure which doctor prescribed the metoprolol or lisinopril but wants to know if Dr [name] will refill meds;pt is almost out of metoprolol; pt wants to know if can not take metoprolol until gets refill from mail order company.If not pt request 2 week supply called to [facility]. Pt also has omeprazole that expired in 2013 and wants to know if can take the omeprazole or not; advised pt she should not take expired med and pt voiced understanding. Pt request cb.

## 2013-11-07 NOTE — Telephone Encounter (Signed)
rx sent to pharmacy by e-script  

## 2013-12-02 ENCOUNTER — Other Ambulatory Visit: Payer: Self-pay | Admitting: Internal Medicine

## 2013-12-02 NOTE — Telephone Encounter (Signed)
Last filled 11/02/13 [facility] PATIENT, Please send back to me for call in  

## 2013-12-02 NOTE — Telephone Encounter (Signed)
rx called into pharmacy

## 2013-12-02 NOTE — Telephone Encounter (Signed)
Ok to phone in ambien 

## 2013-12-08 ENCOUNTER — Telehealth: Payer: Self-pay | Admitting: *Deleted

## 2013-12-08 NOTE — Telephone Encounter (Signed)
I don't think she has extenuating circumstances to justify this She will need to pay herself after the 90 pills

## 2013-12-08 NOTE — Telephone Encounter (Signed)
Pt only allowed 90 tablets per year, would have to pay of of pocket for medication, "plan limit exceeded" per insurance can call [phone] to appeal for quantity limit.

## 2013-12-09 NOTE — Telephone Encounter (Signed)
.  left message to have patient return my call.  

## 2013-12-14 MED ORDER — ZOLPIDEM TARTRATE 5 MG PO TABS: 5.0000 mg | ORAL_TABLET | Freq: Every evening | ORAL | Status: DC | PRN

## 2013-12-14 NOTE — Telephone Encounter (Signed)
Okay to refill #90 x 0

## 2013-12-14 NOTE — Telephone Encounter (Signed)
Spoke with patient and she is willing to pay out of pocket, please advise ok to refill?

## 2013-12-14 NOTE — Telephone Encounter (Signed)
rx called into pharmacy

## 2014-01-20 ENCOUNTER — Encounter: Payer: Self-pay | Admitting: Internal Medicine

## 2014-01-20 ENCOUNTER — Ambulatory Visit (INDEPENDENT_AMBULATORY_CARE_PROVIDER_SITE_OTHER): Payer: Medicare Other | Admitting: Internal Medicine

## 2014-01-20 VITALS — BP 120/62 | HR 60 | Temp 97.8°F | Ht <= 58 in | Wt 103.0 lb

## 2014-01-20 DIAGNOSIS — I2510 Atherosclerotic heart disease of native coronary artery without angina pectoris: Secondary | ICD-10-CM

## 2014-01-20 DIAGNOSIS — F39 Unspecified mood [affective] disorder: Secondary | ICD-10-CM

## 2014-01-20 DIAGNOSIS — Z0000 Encounter for general adult medical examination without abnormal findings: Secondary | ICD-10-CM | POA: Insufficient documentation

## 2014-01-20 DIAGNOSIS — I4891 Unspecified atrial fibrillation: Secondary | ICD-10-CM

## 2014-01-20 DIAGNOSIS — Z23 Encounter for immunization: Secondary | ICD-10-CM

## 2014-01-20 DIAGNOSIS — K219 Gastro-esophageal reflux disease without esophagitis: Secondary | ICD-10-CM

## 2014-01-20 LAB — CBC WITH DIFFERENTIAL/PLATELET
BASOS ABS: 0 10*3/uL (ref 0.0–0.1)
Basophils Relative: 0.4 % (ref 0.0–3.0)
Eosinophils Absolute: 0.1 10*3/uL (ref 0.0–0.7)
Eosinophils Relative: 2.3 % (ref 0.0–5.0)
HEMATOCRIT: 39.5 % (ref 36.0–46.0)
Hemoglobin: 13.1 g/dL (ref 12.0–15.0)
LYMPHS ABS: 1.2 10*3/uL (ref 0.7–4.0)
Lymphocytes Relative: 24.1 % (ref 12.0–46.0)
MCHC: 33.2 g/dL (ref 30.0–36.0)
MCV: 90.4 fl (ref 78.0–100.0)
MONOS PCT: 19.9 % — AB (ref 3.0–12.0)
Monocytes Absolute: 1 10*3/uL (ref 0.1–1.0)
Neutro Abs: 2.6 10*3/uL (ref 1.4–7.7)
Neutrophils Relative %: 53.3 % (ref 43.0–77.0)
Platelets: 196 10*3/uL (ref 150.0–400.0)
RBC: 4.37 Mil/uL (ref 3.87–5.11)
RDW: 16.8 % — AB (ref 11.5–15.5)
WBC: 4.9 10*3/uL (ref 4.0–10.5)

## 2014-01-20 LAB — LIPID PANEL
CHOL/HDL RATIO: 4
Cholesterol: 274 mg/dL — ABNORMAL HIGH (ref 0–200)
HDL: 74.5 mg/dL (ref >39.00)
LDL Cholesterol: 177 mg/dL — ABNORMAL HIGH (ref 0–99)
NONHDL: 199.5
Triglycerides: 114 mg/dL (ref 0.0–149.0)
VLDL: 22.8 mg/dL (ref 0.0–40.0)

## 2014-01-20 LAB — COMPREHENSIVE METABOLIC PANEL
ALT: 15 U/L (ref 0–35)
AST: 24 U/L (ref 0–37)
Albumin: 4.3 g/dL (ref 3.5–5.2)
Alkaline Phosphatase: 92 U/L (ref 39–117)
BILIRUBIN TOTAL: 0.7 mg/dL (ref 0.2–1.2)
BUN: 28 mg/dL — ABNORMAL HIGH (ref 6–23)
CO2: 30 meq/L (ref 19–32)
CREATININE: 1.1 mg/dL (ref 0.4–1.2)
Calcium: 10.1 mg/dL (ref 8.4–10.5)
Chloride: 103 mEq/L (ref 96–112)
GFR: 50.61 mL/min — AB (ref >60.00)
Glucose, Bld: 85 mg/dL (ref 70–99)
Potassium: 5.1 mEq/L (ref 3.5–5.1)
SODIUM: 140 meq/L (ref 135–145)
TOTAL PROTEIN: 7 g/dL (ref 6.0–8.3)

## 2014-01-20 LAB — TSH: TSH: 1.54 u[IU]/mL (ref 0.35–4.50)

## 2014-01-20 LAB — T4, FREE: Free T4: 2.59 ng/dL — ABNORMAL HIGH (ref 0.60–1.60)

## 2014-01-20 NOTE — Progress Notes (Signed)
Pre visit review using our clinic review tool, if applicable. No additional management support is needed unless otherwise documented below in the visit note. 

## 2014-01-20 NOTE — Assessment & Plan Note (Signed)
Regular (paced) On apixaban now

## 2014-01-20 NOTE — Progress Notes (Signed)
Subjective:    Patient ID: Joan Hull, female    DOB: 22-Feb-1926, 80 y.o.   MRN: [id number]  HPI Here for Medicare wellness and follow up Form reviewed No falls No depression or anhedonia Independent with all instrumental ADLs. Still drives No tobacco or alcohol Sees Dr Ubaldo Glassing and Dr Wallace Going No cognitive changes of note--- will misplace things at times Vision is fine. Mild hearing loss with conflicting noises Reviewed preventative care  No palpitations or recurrence of tachycardia No chest pain No SOB. Stable DOE On apixaban now--no problems with this No dizziness or syncope  Voiding okay No major cystitis problems Uses stool softener regularly to keep her regular  Mood has been okay on the med Mild persistent anxiety If skips zolpidem--doesn't sleep and anxiety acts up  Stomach okay other than bloating No heartburn on the pantoprazole No swallowing problems  Current Outpatient Prescriptions on File Prior to Visit  Medication Sig Dispense Refill  . apixaban (ELIQUIS) 2.5 MG TABS tablet Take 1 tablet (2.5 mg total) by mouth 2 (two) times daily.  180 tablet  1  . aspirin 81 MG tablet Take 81 mg by mouth daily.        Marland Kitchen BIOTIN PO Take by mouth daily.      . Calcium Citrate-Vitamin D (CITRACAL + D PO) Take by mouth daily.      Marland Kitchen lisinopril (PRINIVIL,ZESTRIL) 20 MG tablet Take 0.5 tablets (10 mg total) by mouth daily.  45 tablet  3  . metoprolol (LOPRESSOR) 50 MG tablet Take 0.5 tablets (25 mg total) by mouth daily.  45 tablet  3  . pantoprazole (PROTONIX) 40 MG tablet Take 1 tablet (40 mg total) by mouth daily.  90 tablet  3  . PARoxetine (PAXIL) 20 MG tablet Take 0.5 tablets (10 mg total) by mouth every morning.  90 tablet  3  . Probiotic Product (PROBIOTIC DAILY PO) Take by mouth daily.      Marland Kitchen torsemide (DEMADEX) 20 MG tablet Take 10 mg by mouth daily.      Marland Kitchen zolpidem (AMBIEN) 5 MG tablet Take 1 tablet (5 mg total) by mouth at bedtime as needed.  90 tablet  0    No current facility-administered medications on file prior to visit.    Allergies  Allergen Reactions  . Aspirin   . Diazepam     REACTION: worsened bladder problems    Past Medical History  Diagnosis Date  . Anxiety   . Depression   . Hyperlipidemia   . Arthritis   . Osteoporosis   . GERD (gastroesophageal reflux disease)   . CAD (coronary artery disease)   . Interstitial cystitis   . Hx of colonic polyps   . Atrial fibrillation   . Non Q wave myocardial infarction 06/27/11    [facility]    Past Surgical History  Procedure Laterality Date  . Cataract extraction  2005  . Coronary stent placement  7/12    RCA with drug eluting stent---[name]  . Coronary artery bypass graft  1/13    [facility]  . Pacemaker insertion  5/13    [facility]    Family History  Problem Relation Age of Onset  . Heart disease Mother     cad  . Hypertension Mother   . Heart disease Father     heart attack  . Heart disease Sister     half sister MI  . Heart disease Brother     cabg  . Heart disease  Brother   . Cancer Brother     BLADDER  . Cancer Sister     BRAIN  . Cancer Sister     OVARIAN    History   Social History  . Marital Status: Widowed    Spouse Name: N/A    Number of Children: 1  . Years of Education: N/A   Occupational History  . RETIRED ([employer])    Social History Main Topics  . Smoking status: Former Research scientist (life sciences)  . Smokeless tobacco: Never Used  . Alcohol Use: No  . Drug Use: No  . Sexual Activity: Not on file   Other Topics Concern  . Not on file   Social History Narrative   No living will   Requests son,Chadwick, as her health care POA   Would accept resuscitation attempts--but no prolonged ventilation   No tube feeds if cognitively unaware   Review of Systems Sleeps great! Still uses the zolpidem every day. Appetite is fine Weight is fairly stable Tends to bloat easy--torsemide helps this     Objective:   Physical Exam   Constitutional: She is oriented to person, place, and time. She appears well-developed and well-nourished. No distress.  HENT:  Mouth/Throat: Oropharynx is clear and moist. No oropharyngeal exudate.  Neck: Normal range of motion. Neck supple. No thyromegaly present.  Cardiovascular: Normal rate, regular rhythm, normal heart sounds and intact distal pulses.  Exam reveals no gallop.   No murmur heard. Pulmonary/Chest: Effort normal and breath sounds normal. No respiratory distress. She has no wheezes. She has no rales.  Abdominal: Soft. There is no tenderness.  Musculoskeletal: She exhibits no edema and no tenderness.  Lymphadenopathy:    She has no cervical adenopathy.  Neurological: She is alert and oriented to person, place, and time.  President-- "Obama, Tawni Pummel, Tawni Pummel--"   [id number]  Had some difficulty D-l-r-[id number]-w Recall 3/3  Skin: No rash noted.  Psychiatric: She has a normal mood and affect. Her behavior is normal.          Assessment & Plan:

## 2014-01-20 NOTE — Addendum Note (Signed)
Addended by: Despina Hidden on: 01/20/2014 11:49 AM   Modules accepted: Orders

## 2014-01-20 NOTE — Assessment & Plan Note (Signed)
I have personally reviewed the Medicare Annual Wellness questionnaire and have noted 1. The patient's medical and social history 2. Their use of alcohol, tobacco or illicit drugs 3. Their current medications and supplements 4. The patient's functional ability including ADL's, fall risks, home safety risks and hearing or visual             impairment. 5. Diet and physical activities 6. Evidence for depression or mood disorders  The patients weight, height, BMI and visual acuity have been recorded in the chart I have made referrals, counseling and provided education to the patient based review of the above and I have provided the pt with a written personalized care plan for preventive services.  I have provided you with a copy of your personalized plan for preventive services. Please take the time to review along with your updated medication list.  No cancer screening Prefers no flu shot prevnar today No other preventative care due

## 2014-01-20 NOTE — Assessment & Plan Note (Signed)
No depression Still anxious--but better with the paroxetine

## 2014-01-20 NOTE — Assessment & Plan Note (Signed)
No recurrent symptoms Stable DOE  Follows with Dr Ubaldo Glassing

## 2014-01-20 NOTE — Assessment & Plan Note (Signed)
Quiet with the med 

## 2014-01-23 NOTE — Progress Notes (Signed)
   Subjective:    Patient ID: Joan Hull, female    DOB: Sep 21, 1925, 80 y.o.   MRN: [id number]  HPI History added only   Review of Systems     Objective:   Physical Exam        Assessment & Plan:

## 2014-01-23 NOTE — Assessment & Plan Note (Signed)
History added only

## 2014-02-09 ENCOUNTER — Ambulatory Visit (INDEPENDENT_AMBULATORY_CARE_PROVIDER_SITE_OTHER): Payer: Medicare Other | Admitting: Internal Medicine

## 2014-02-09 ENCOUNTER — Encounter: Payer: Self-pay | Admitting: Internal Medicine

## 2014-02-09 VITALS — BP 130/60 | HR 60 | Temp 97.9°F | Wt 104.0 lb

## 2014-02-09 DIAGNOSIS — I2510 Atherosclerotic heart disease of native coronary artery without angina pectoris: Secondary | ICD-10-CM

## 2014-02-09 DIAGNOSIS — R946 Abnormal results of thyroid function studies: Secondary | ICD-10-CM

## 2014-02-09 DIAGNOSIS — R7989 Other specified abnormal findings of blood chemistry: Secondary | ICD-10-CM | POA: Insufficient documentation

## 2014-02-09 NOTE — Progress Notes (Signed)
Subjective:    Patient ID: Joan Hull, female    DOB: Aug 04, 1926, 80 y.o.   MRN: [id number]  HPI Here to review thyroid function 3 sisters had some degree of thyroid function---might have been overactive (describes Grave's ophthalmopathy)  Feels okay Weight is stable No tremor No changes in hair Has noted some increased itching of late  Current Outpatient Prescriptions on File Prior to Visit  Medication Sig Dispense Refill  . apixaban (ELIQUIS) 2.5 MG TABS tablet Take 1 tablet (2.5 mg total) by mouth 2 (two) times daily.  180 tablet  1  . BIOTIN PO Take by mouth daily.      . Calcium Citrate-Vitamin D (CITRACAL + D PO) Take by mouth daily.      Marland Kitchen lisinopril (PRINIVIL,ZESTRIL) 20 MG tablet Take 0.5 tablets (10 mg total) by mouth daily.  45 tablet  3  . metoprolol (LOPRESSOR) 50 MG tablet Take 0.5 tablets (25 mg total) by mouth daily.  45 tablet  3  . pantoprazole (PROTONIX) 40 MG tablet Take 1 tablet (40 mg total) by mouth daily.  90 tablet  3  . PARoxetine (PAXIL) 20 MG tablet Take 0.5 tablets (10 mg total) by mouth every morning.  90 tablet  3  . Probiotic Product (PROBIOTIC DAILY PO) Take by mouth daily.      Marland Kitchen torsemide (DEMADEX) 20 MG tablet Take 10 mg by mouth daily.      Marland Kitchen zolpidem (AMBIEN) 5 MG tablet Take 1 tablet (5 mg total) by mouth at bedtime as needed.  90 tablet  0   No current facility-administered medications on file prior to visit.    Allergies  Allergen Reactions  . Aspirin   . Diazepam     REACTION: worsened bladder problems    Past Medical History  Diagnosis Date  . Anxiety   . Depression   . Hyperlipidemia   . Arthritis   . Osteoporosis   . GERD (gastroesophageal reflux disease)   . CAD (coronary artery disease)   . Interstitial cystitis   . Hx of colonic polyps   . Atrial fibrillation   . Non Q wave myocardial infarction 06/27/11    [facility]    Past Surgical History  Procedure Laterality Date  . Cataract extraction  2005  . Coronary  stent placement  7/12    RCA with drug eluting stent---[name]  . Coronary artery bypass graft  1/13    [facility]  . Pacemaker insertion  5/13    [facility]    Family History  Problem Relation Age of Onset  . Heart disease Mother     cad  . Hypertension Mother   . Heart disease Father     heart attack  . Heart disease Sister     half sister MI  . Heart disease Brother     cabg  . Heart disease Brother   . Cancer Brother     BLADDER  . Cancer Sister     BRAIN  . Cancer Sister     OVARIAN    History   Social History  . Marital Status: Widowed    Spouse Name: N/A    Number of Children: 1  . Years of Education: N/A   Occupational History  . RETIRED ([employer])    Social History Main Topics  . Smoking status: Former Research scientist (life sciences)  . Smokeless tobacco: Never Used  . Alcohol Use: No  . Drug Use: No  . Sexual Activity: Not  on file   Other Topics Concern  . Not on file   Social History Narrative   No living will   Requests son,Chadwick, as her health care POA   Would accept resuscitation attempts--but no prolonged ventilation   No tube feeds if cognitively unaware   Review of Systems No palpitations Sleeps okay No swallowing problems    Objective:   Physical Exam  Constitutional: She appears well-developed and well-nourished. No distress.  Eyes:  No bulging of eyes  Neck: Normal range of motion. Neck supple. Thyromegaly present.  Neurological:  No tremor          Assessment & Plan:

## 2014-02-09 NOTE — Progress Notes (Signed)
Pre visit review using our clinic review tool, if applicable. No additional management support is needed unless otherwise documented below in the visit note. 

## 2014-02-09 NOTE — Assessment & Plan Note (Signed)
No clinical indication of hyperthyroidism but free T4 is normal TSH normal No thyroid enlargement Does have FH of at least 1 sister with Graves disease  Will just recheck labs If still high, will check RAI scan to determine activity and size of thyroid

## 2014-02-10 LAB — TSH: TSH: 1.09 u[IU]/mL (ref 0.35–4.50)

## 2014-02-10 LAB — T4, FREE: FREE T4: 1.49 ng/dL (ref 0.60–1.60)

## 2014-02-10 LAB — T3, FREE: T3 FREE: 2.8 pg/mL (ref 2.3–4.2)

## 2014-02-14 ENCOUNTER — Encounter: Payer: Self-pay | Admitting: *Deleted

## 2014-02-27 ENCOUNTER — Other Ambulatory Visit: Payer: Self-pay | Admitting: Internal Medicine

## 2014-02-27 ENCOUNTER — Encounter: Payer: Medicare Other | Admitting: Internal Medicine

## 2014-04-03 ENCOUNTER — Other Ambulatory Visit: Payer: Self-pay | Admitting: Internal Medicine

## 2014-04-03 NOTE — Telephone Encounter (Signed)
Okay #90 x 0 

## 2014-04-03 NOTE — Telephone Encounter (Signed)
rx called into pharmacy

## 2014-04-03 NOTE — Telephone Encounter (Signed)
12/14/2013 

## 2014-06-05 ENCOUNTER — Other Ambulatory Visit: Payer: Self-pay | Admitting: Internal Medicine

## 2014-06-05 NOTE — Telephone Encounter (Signed)
rx sent to pharmacy by e-script  

## 2014-06-05 NOTE — Telephone Encounter (Signed)
Ok to fill 

## 2014-06-05 NOTE — Telephone Encounter (Signed)
Okay to refill for a year 

## 2014-06-23 ENCOUNTER — Ambulatory Visit: Payer: Self-pay | Admitting: Cardiology

## 2014-06-26 ENCOUNTER — Other Ambulatory Visit: Payer: Self-pay | Admitting: Internal Medicine

## 2014-06-26 NOTE — Telephone Encounter (Signed)
04/03/14 

## 2014-06-26 NOTE — Telephone Encounter (Signed)
Okay #90 x 0 Let her know she is a week early. I will not be able to fill this early again. She should not be due again till mid February

## 2014-06-27 NOTE — Telephone Encounter (Signed)
rx called into pharmacy Spoke with patient and advised results   

## 2014-07-03 ENCOUNTER — Telehealth: Payer: Self-pay | Admitting: *Deleted

## 2014-07-03 NOTE — Telephone Encounter (Signed)
Pt calling asking if she can take some left over Spironolactone that she has, she's on torsemide but thought since she had this left over it would be ok to take it? Because per pt they are both fluid pills.

## 2014-07-03 NOTE — Telephone Encounter (Signed)
That medication is used mostly in folks with heart or liver failure---which she doesn't have I would not recommend that she take the spironolactone at this point

## 2014-07-04 MED ORDER — PAROXETINE HCL 10 MG PO TABS: 10.0000 mg | ORAL_TABLET | Freq: Every day | ORAL | Status: DC

## 2014-07-04 MED ORDER — TORSEMIDE 20 MG PO TABS: 10.0000 mg | ORAL_TABLET | Freq: Every day | ORAL | Status: DC

## 2014-07-04 MED ORDER — METOPROLOL TARTRATE 50 MG PO TABS: 25.0000 mg | ORAL_TABLET | Freq: Two times a day (BID) | ORAL | Status: DC

## 2014-07-04 NOTE — Addendum Note (Signed)
Addended by: Despina Hidden on: 07/04/2014 01:29 PM   Modules accepted: Orders

## 2014-07-04 NOTE — Telephone Encounter (Signed)
Pt returned your call and request c/b. She has further questions  [phone]

## 2014-07-04 NOTE — Telephone Encounter (Signed)
Left detailed message on home answering machine, advised pt to call if any questions

## 2014-07-04 NOTE — Telephone Encounter (Signed)
Spoke with patient and advised results   

## 2014-07-28 ENCOUNTER — Other Ambulatory Visit: Payer: Self-pay

## 2014-07-28 MED ORDER — APIXABAN 2.5 MG PO TABS
2.5000 mg | ORAL_TABLET | Freq: Two times a day (BID) | ORAL | Status: DC
Start: 2014-07-28 — End: 2014-08-18

## 2014-07-28 NOTE — Telephone Encounter (Signed)
Pt is changing mail order pharmacies 08/2014 and pt needs 1 week of med to finish out this month. Eliquis # 14 called to [place].pt requested cost; $35.42. Pt voiced understanding.

## 2014-08-18 ENCOUNTER — Other Ambulatory Visit: Payer: Self-pay | Admitting: Internal Medicine

## 2014-08-21 NOTE — Telephone Encounter (Signed)
PLEASE NOTE: All timestamps contained within this report are represented as [place] Standard Time. CONFIDENTIALTY NOTICE: This fax transmission is intended only for the addressee. It contains information that is legally privileged, confidential or otherwise protected from use or disclosure. If you are not the intended recipient, you are strictly prohibited from reviewing, disclosing, copying using or disseminating any of this information or taking any action in reliance on or regarding this information. If you have received this fax in error, please notify us immediately by telephone so that we can arrange for its return to [place]. Phone: [phone], Toll-Free: [phone], Fax: [phone] Page: 1 of 3 Call Id: [id number] [place] Patient Name: Joan Hull Gender: Female DOB: [birth year] Age: 81 Y 60 M 11 D Return Phone Number: [phone] (Primary) Address: City/State/Zip: [place] Client [place] Night - Client Client Site [place] Physician Viviana Simpler Contact Type Call Call Type Triage / Clinical Caller Name [facility] [place] Pharmacist Relationship To Patient Provider Return Phone Number Unavailable Chief Complaint Paging or Request for Consult Initial Comment [facility]er states [facility] [phone] needs to consult with on call about filling blood thinner for the patient who is out. Eliquis is the medication. Nurse Assessment Nurse: Raphael Gibney, RN, Vanita Ingles Date/Time (Eastern Time): 08/18/2014 12:00:53 PM Please select the assessment type ---Refill Additional Documentation ---Caller states she is Snjezana from [place] and pt is out of Eliquis. She takes 2.5 mg BID. Medication was filled Dec 11 for 2 weeks until she got her new insurance. Does the patient have enough medication to last until the office opens? ---Unable to obtain  loaner dose from Pharmacy Does the client directives allow for assistance with medications after hours? ---Yes Was the medication filled within the last 6 months? ---Yes What is the name of the medication, dose and instructions as listed on the bottle? ---Eliquis 2.5 mg BID Name of the physician as listed on the bottle. ---Dr. Viviana Simpler Pharmacy name and phone number where most recently [phone] Additional Documentation ---Advised caller as per client profile that enough Eliquis can be called in until office opens on Monday. Eliquis 2.5 mg BID #8 and note would be faxed over to MD office for refill. Verbalized understanding. Guidelines Guideline Title Affirmed Question Affirmed Notes Nurse Date/Time (Eastern Time) Disp. Time Eilene Ghazi Time) Disposition Final User 08/18/2014 11:50:44 AM Send To RN Personal Donne Anon, RN, Debra 08/18/2014 12:15:04 PM Clinical Call Yes Raphael Gibney, RN, Vanita Ingles PLEASE NOTE: All timestamps contained within this report are represented as [place] Standard Time. CONFIDENTIALTY NOTICE: This fax transmission is intended only for the addressee. It contains information that is legally privileged, confidential or otherwise protected from use or disclosure. If you are not the intended recipient, you are strictly prohibited from reviewing, disclosing, copying using or disseminating any of this information or taking any action in reliance on or regarding this information. If you have received this fax in error, please notify us immediately by telephone so that we can arrange for its return to [place]. Phone: [phone], Toll-Free: [phone], Fax: [phone] Page: 2 of 3 Call Id: [id number] After Care Instructions Given Call Event Type User Date / Time Description Verbal Orders/Maintenance Medications Medication Refill Route Dosage Regime Duration Admin Instructions User Name Eliquis Yes Oral Eliquis 2.5 mg BID #8 Raphael Gibney, RN, Vanita Ingles PLEASE NOTE: All  timestamps contained within this report are represented as [place] Standard Time. CONFIDENTIALTY  NOTICE: This fax transmission is intended only for the addressee. It contains information that is legally privileged, confidential or otherwise protected from use or disclosure. If you are not the intended recipient, you are strictly prohibited from reviewing, disclosing, copying using or disseminating any of this information or taking any action in reliance on or regarding this information. If you have received this fax in error, please notify us immediately by telephone so that we can arrange for its return to [place]. Phone: [phone], Toll-Free: [phone], Fax: [phone] Page: 3 of 3 Call Id: [id number] [facility] [address], [place] [address] [phone] Fax: [phone] [place] Night - Client [place] - Night Date: 08/18/2014 From: [facility] Department To: Viviana Simpler Please sign the order for the approved drug(s) given by our call center nurse on your behalf. Fax to [phone] within 5 business days. Thank you. Date Eilene Ghazi Time): 08/18/2014 11:07:44 AM Triage RN: Dannielle Burn, RN NAME: Joan Hull PHONE NUMBER: [phone] (Primary) BIRTHDATE: 12/13/1925 ADDRESS: CITY/STATE/ZIP: [place] CALLER: Provider NAME: Redgie Grayer - Pharmacist Rx Given Medication Refill Route Dosage Regime Duration Admin Instructions Eliquis Yes Oral Eliquis 2.5 mg BID MD Signature Date

## 2014-08-28 ENCOUNTER — Telehealth: Payer: Self-pay | Admitting: Internal Medicine

## 2014-09-01 NOTE — Telephone Encounter (Signed)
error 

## 2014-09-05 ENCOUNTER — Telehealth: Payer: Self-pay | Admitting: *Deleted

## 2014-09-05 MED ORDER — APIXABAN 2.5 MG PO TABS: 2.5000 mg | ORAL_TABLET | Freq: Two times a day (BID) | ORAL | Status: DC

## 2014-09-05 NOTE — Telephone Encounter (Signed)
Pt is trying to save money and wanted to know if she can take 80mg  of Diltiazem twice a day? In the generic form? Please advise

## 2014-09-05 NOTE — Telephone Encounter (Signed)
.  left message to have patient return my call.  

## 2014-09-06 NOTE — Telephone Encounter (Signed)
Normally non sustained diltiazem is taken three times a day---which is very hard to remember. I am concerned that it will be wearing off inbetween doses with bid  If she could remember tid dosing, we can try 60 tid of the immediate release

## 2014-09-06 NOTE — Telephone Encounter (Signed)
.  left message to have patient return my call.  

## 2014-09-07 NOTE — Telephone Encounter (Signed)
Spoke with patient and advised results She will just leave the medication as is

## 2014-09-26 ENCOUNTER — Other Ambulatory Visit: Payer: Self-pay

## 2014-09-26 NOTE — Telephone Encounter (Signed)
Pt left v/m requesting refill zolpidem to [facility] [address]. Pt last annual exam 01/20/14.Please advise.

## 2014-09-27 MED ORDER — ZOLPIDEM TARTRATE 5 MG PO TABS: 5.0000 mg | ORAL_TABLET | Freq: Every evening | ORAL | Status: DC | PRN

## 2014-09-27 NOTE — Telephone Encounter (Signed)
Rx called in to pharmacy. 

## 2014-09-27 NOTE — Telephone Encounter (Signed)
Approved: #90 x 0 

## 2014-10-04 ENCOUNTER — Telehealth: Payer: Self-pay | Admitting: *Deleted

## 2014-10-04 NOTE — Telephone Encounter (Signed)
Pt recently dropped off form for PA for ambien. Per DR [name], PA not required due to age, as it will be denied and pt will need to pay for med out of pocket. Lm on pts vm requesting a call back

## 2014-10-04 NOTE — Telephone Encounter (Signed)
Pt left v/m returning call and pt request cb. 

## 2014-10-05 NOTE — Telephone Encounter (Signed)
Spoke to pt and informed her per Dr [name]. Pt verbally expressed understanding. i advised pt to contact her pharmacy and see what out of pocket cost would be. Pt states that she would contact office back if medication is too expensive to see if there is an alternate that may be as effective.

## 2014-10-24 ENCOUNTER — Telehealth: Payer: Self-pay | Admitting: Internal Medicine

## 2014-10-24 ENCOUNTER — Telehealth: Payer: Self-pay

## 2014-10-24 NOTE — Telephone Encounter (Signed)
Patient called back about the prior authorization.  Patient said she's has enough pills through Saturday.

## 2014-10-24 NOTE — Telephone Encounter (Signed)
Pt will contact [facility] [address] and request PA sent to [facility] for zolpidem.

## 2014-10-24 NOTE — Telephone Encounter (Signed)
Pt has changed ins co to [facility] and pt received letter that zolpidem needs to have prior auth for exception for zolpidem. Pt request call to [facility] at [phone].

## 2014-10-24 NOTE — Telephone Encounter (Signed)
Called and spoke to [place] at [facility] and she is to have paperwork faxed over to be completed for prior authorization.  Request from [facility] for PA is on Dee's desk.

## 2014-10-24 NOTE — Telephone Encounter (Signed)
Received paperwork from [facility] to be completed for prior authorization.  Left message for patient to call back. Need to know if she has enough pills to last her until Dr. [name] returns to complete paperwork for PA. Form is on Dee's desk.

## 2014-10-25 NOTE — Telephone Encounter (Signed)
Done, given to MYD to fax back

## 2014-10-25 NOTE — Telephone Encounter (Signed)
Prior authorization paperwork sent to Webb Silversmith NP to complete and fax back.

## 2014-10-25 NOTE — Telephone Encounter (Signed)
Patient called back stating that she has enough medication to last thru Saturday.  Paperwork needs to be completed for PA and faxed back.

## 2014-10-26 NOTE — Telephone Encounter (Signed)
Form has been faxed--awaiting response

## 2014-10-27 NOTE — Telephone Encounter (Signed)
Received fax from [facility] [employer] # [id number] [phone]  Approved from 10/26/2014-08/18/2015--called pharmacy to let them know it had been approved

## 2014-11-07 ENCOUNTER — Encounter: Payer: Self-pay | Admitting: Internal Medicine

## 2014-11-07 ENCOUNTER — Ambulatory Visit (INDEPENDENT_AMBULATORY_CARE_PROVIDER_SITE_OTHER): Payer: PPO | Admitting: Internal Medicine

## 2014-11-07 VITALS — BP 120/70 | HR 122 | Temp 97.5°F | Wt 104.0 lb

## 2014-11-07 DIAGNOSIS — F39 Unspecified mood [affective] disorder: Secondary | ICD-10-CM

## 2014-11-07 DIAGNOSIS — I480 Paroxysmal atrial fibrillation: Secondary | ICD-10-CM

## 2014-11-07 NOTE — Progress Notes (Signed)
Pre visit review using our clinic review tool, if applicable. No additional management support is needed unless otherwise documented below in the visit note. 

## 2014-11-07 NOTE — Progress Notes (Signed)
Subjective:    Patient ID: Joan Hull, female    DOB: 07/06/1926, 81 y.o.   MRN: [id number]  HPI Here to have [employer] forms filled out Had the accident a couple of years ago when she was dehydrated with C diff No recent accidents No citations at all  She feels like she is doing well Heart is fine Did have 1 episode of the atrial fibrillation breaking through with rapid rate Apparently treated as outpatient by Dr Ubaldo Glassing This was in 11/15 No recent problems  No chest pain No palpitations regularly---will have some brief spells at times. Doesn't bother her No SOB  Current Outpatient Prescriptions on File Prior to Visit  Medication Sig Dispense Refill  . apixaban (ELIQUIS) 2.5 MG TABS tablet Take 1 tablet (2.5 mg total) by mouth 2 (two) times daily. 180 tablet 3  . BIOTIN PO Take by mouth daily.    . Calcium Citrate-Vitamin D (CITRACAL + D PO) Take by mouth daily.    . digoxin (LANOXIN) 0.25 MG tablet Take 0.25 mg by mouth daily.    Marland Kitchen diltiazem (CARDIZEM CD) 180 MG 24 hr capsule Take 180 mg by mouth daily.   5  . metoprolol (LOPRESSOR) 50 MG tablet Take 0.5 tablets (25 mg total) by mouth 2 (two) times daily. 45 tablet 3  . pantoprazole (PROTONIX) 40 MG tablet Take 1 tablet (40 mg total) by mouth daily. 90 tablet 3  . PARoxetine (PAXIL) 10 MG tablet Take 1 tablet (10 mg total) by mouth daily. 90 tablet 3  . Probiotic Product (PROBIOTIC DAILY PO) Take by mouth daily.    Marland Kitchen torsemide (DEMADEX) 20 MG tablet Take 0.5 tablets (10 mg total) by mouth daily. 45 tablet 3  . zolpidem (AMBIEN) 5 MG tablet Take 1 tablet (5 mg total) by mouth at bedtime as needed. 90 tablet 0   No current facility-administered medications on file prior to visit.    Allergies  Allergen Reactions  . Aspirin   . Diazepam     REACTION: worsened bladder problems    Past Medical History  Diagnosis Date  . Anxiety   . Depression   . Hyperlipidemia   . Arthritis   . Osteoporosis   . GERD  (gastroesophageal reflux disease)   . CAD (coronary artery disease)   . Interstitial cystitis   . Hx of colonic polyps   . Atrial fibrillation   . Non Q wave myocardial infarction 06/27/11    [facility]    Past Surgical History  Procedure Laterality Date  . Cataract extraction  2005  . Coronary stent placement  7/12    RCA with drug eluting stent---[name]  . Coronary artery bypass graft  1/13    [facility]  . Pacemaker insertion  5/13    [facility]    Family History  Problem Relation Age of Onset  . Heart disease Mother     cad  . Hypertension Mother   . Heart disease Father     heart attack  . Heart disease Sister     half sister MI  . Heart disease Brother     cabg  . Heart disease Brother   . Cancer Brother     BLADDER  . Cancer Sister     BRAIN  . Cancer Sister     OVARIAN    History   Social History  . Marital Status: Widowed    Spouse Name: N/A  . Number of Children: 1  . Years of  Education: N/A   Occupational History  . RETIRED ([employer])    Social History Main Topics  . Smoking status: Former Research scientist (life sciences)  . Smokeless tobacco: Never Used  . Alcohol Use: No  . Drug Use: No  . Sexual Activity: Not on file   Other Topics Concern  . Not on file   Social History Narrative   No living will   Requests son,[place]adwick, as her health care POA   Would accept resuscitation attempts--but no prolonged ventilation   No tube feeds if cognitively unaware   Review of Systems  Appetite is okay Weight is stable Sleeps okay usually     Objective:   Physical Exam  Constitutional: She appears well-developed. No distress.  Neck: Normal range of motion. Neck supple. No thyromegaly present.  Cardiovascular: Regular rhythm and normal heart sounds.   Rate ~120  Pulmonary/Chest: Effort normal and breath sounds normal. No respiratory distress. She has no wheezes. She has no rales.  Musculoskeletal: She exhibits no edema.  Lymphadenopathy:    She has no cervical  adenopathy.  Psychiatric:  Mildly anxious as usual          Assessment & Plan:

## 2014-11-07 NOTE — Assessment & Plan Note (Signed)
Regular but fast now despite her multiple meds Follows with Dr Ubaldo Glassing No reason not to drive at this point

## 2014-11-07 NOTE — Assessment & Plan Note (Signed)
Mostly anxiety Does okay with the paroxetine

## 2014-12-09 NOTE — Discharge Summary (Signed)
PATIENT NAME:  Joan Hull, Joan Hull MR#:  [id number] DATE OF BIRTH:  1926/07/23  DATE OF ADMISSION:  10/20/2013 DATE OF DISCHARGE:  10/21/2013  REASON FOR ADMISSION:  Chest pain.   DISCHARGE DIAGNOSIS:  Chest pain secondary to atrial fibrillation with rapid ventricular response and stable angina.   OTHER DIAGNOSES:  Includes hypertension, atrial fibrillation with rapid ventricular response, known coronary artery disease, chronic disease anemia.    Echocardiogram shows an ejection fraction of 60% to 65% with normal lower left ventricular systolic function and mild left ventricular hypertrophy.  Nuclear study shows GI uptake on images, decreases the specificity of abnormal findings and no significant wall motion abnormality.  They discovered as a high risk scan, and a pharmacological myocardial perfusion study with region of severe ischemia in the anterior, anterolateral and lateral territory.  Possible old anterior MI on baseline EKG.  Ejection fraction was 71%.  Changes concerning for ischemia with Lexiscan.  There is GI uptake artifact noticed on the study.    So the controversy in this patient is whenever I have talked to Dr. Clayborn Bigness he said that he did not agree with the reading of Dr. Fletcher Anon on the scan and that he believes that as soon as the patient is ruled out, she could go home.  At some point he mentioned doing a cardiac catheterization, but he left it to Dr. Ubaldo Glassing to decide.  So, on Dr. Bethanne Ginger study revealed, he said that it was suboptimal due to gastric uptake and the symptoms are not consistent with severe ischemia for what he felt it was okay to discharge her on metoprolol for afterload reduction .  He recommended close followup outpatient.    Throughout the hospital course the patient again was admitted with significant chest pain and chest palpitations early in the morning of the day of admission.  It started as a racing heart and then the palpitations started and then the pain started.   For what it is very likely that the mid sternal chest pain that was not radiating was secondary to the atrial fibrillation.   The patient denies any nausea or sweating at the moment.  No shortness of breath.  She was significantly tachycardic when EMS picked her up with heart rate in the 150s.  When the patient was admitted her heart rate was in the 90s.  Did not have any more episodes of tachycardia in the ER and her troponin was negative and that was at 0.02.  The second troponin though increased to 0.7, the third one 0.91 and then 0.71.  This is likely secondary to demand ischemia due to atrial fibrillation with RVR. A non-STEMI was ruled out.    As far as other medical problems, for atrial fibrillation, the patient was told to continue metoprolol.    She was discharged on medications of Ambien 10 mg once a day, lisinopril 2.5 mg once a day, paroxetine 2.5 mg once a day, aspirin 81 mg once a day, nitroglycerin 0.4 mg every five minutes as needed for chest pain, Protonix 40 mg once a day, Eliquis 2.5 mg twice daily, Crestor 5 mg once a day, metoprolol 25 mg twice daily and Omega-3.   The day of discharge, the patient did not have any significant chest pain.  She did not have any chest pain during this hospitalization.  All she had was prior to coming to the Emergency Department when she was tachycardic for what again we think the chest pain totally was secondary to the  atrial fibrillation going into rapid ventricular response.  Her heart rate at discharge is 60 and her physical exam was overall unremarkable.  The patient is discharged in good condition to follow up with Dr. Bartholome Bill.  I spent about 50 minutes discharging this patient on the day of discharge.     ____________________________ [place] Sink, MD rsg:ea D: 10/27/2013 15:53:55 ET T: 10/28/2013 03:19:33 ET JOB#: [id number]  cc: [place] Sink, MD, <Dictator> Joan Beavin America Brown MD ELECTRONICALLY SIGNED  11/01/2013 7:20

## 2014-12-09 NOTE — Consult Note (Signed)
PATIENT NAME:  Joan Hull, Joan Hull MR#:  [id number] DATE OF BIRTH:  [birth year]  DATE OF CONSULTATION:  10/19/2013  REFERRING PHYSICIAN:  Viviana Simpler, MD CONSULTING PHYSICIAN:  Dwayne D. Clayborn Bigness, MD CARDIOLOGIST: Dr. Ubaldo Glassing.   PHYSICIAN: Phillips Climes, MD   CHIEF COMPLAINT: Chest pain, palpitations.   HISTORY OF PRESENT ILLNESS: The patient is an 81 year old white female with significant history of coronary artery disease; coronary artery bypass, 2012; atrial fibrillation; permanent pacemaker placement in 2013; on Eliquis for anticoagulation.   The patient is complaining of chest pain and palpitation early in the morning, had an episode of heart racing with palpitation, initially took some Tylenol with no relief. Before this, she developed some more chest pain, midsternal, nonradiating. Took some sublingual nitro with resolution of the pain. Denies nausea, vomiting, fever, chills, sweats, shortness of breath, sputum, diaphoresis or lightheadedness. EMS was called. She was slightly tachycardic, rate about 150 with rapid ventricular response. There is no EKG recording. This is by [facility] report.   When she came to the Emergency Room, she had a paced rhythm, about 90. No significant further tachycardia. Troponin was negative. She was given aspirin in the ER and admitted for further evaluation and care.   PAST MEDICAL HISTORY: Anxiety, depression, hyperlipidemia, atrial fibrillation, osteoporosis, osteoarthritis, reflux, coronary artery disease.   PAST SURGICAL HISTORY: Cataracts, angioplasty and stenting, coronary artery bypass surgery, permanent pacemaker placement.   ALLERGIES: None.   MEDICATIONS: Paroxetine 10 mg a day, lisinopril 2.5 a day, sublingual nitroglycerin, aspirin 81 mg a day, metoprolol 25 twice a day, torsemide 20 mg a day, zolpidem 10 mg at bedtime, Protonix 40 a day, Eliquis 2.5 twice a day, omega-3 at 1000 mg daily, probiotic.   FAMILY HISTORY: Myocardial infarction,  coronary artery disease, coronary bypass surgery.   SOCIAL HISTORY:  Smoked for about 10 years, quit in 1964. No alcohol consumption. Lives at home.   REVIEW OF SYSTEMS: No blackout spells or syncope. No nausea or vomiting. No fever. No chills. No sweats. No weight loss. No weight gain, no hemoptysis or hematemesis. No bright red blood per rectum. No vision change or hearing change. Denies sputum production or cough. Positive chest pain.   PHYSICAL EXAMINATION:  VITAL SIGNS: Blood pressure 142/60, pulse 60, respiratory rate of 14, afebrile.  HEENT: Normocephalic, atraumatic. Pupils equal and reactive to light.  NECK: Supple. No significant JVD, bruits, or adenopathy.  LUNGS: Clear to auscultation and percussion. No significant wheeze, rhonchi, or rale.  HEART: Irregularly irregular. Systolic ejection murmur at sternal border. PMI nondisplaced.  ABDOMEN: Benign.  EXTREMITIES: Within normal limits.  NEUROLOGIC: Intact.  SKIN: Normal.   LABORATORIES: Glucose 151, BUN 28, creatinine 1.02, sodium 139, potassium 4.1, chloride 106, CO2 25. Troponin 0.02. White count of , hemoglobin 11.3, hematocrit 33, platelet count 171.   EKG: Paced rhythm at 90, nonspecific finding.   ASSESSMENT: Chest pain with possible angina, palpitations, atrial fibrillation, hypertension, hyperlipidemia, coronary artery disease.   PLAN:  1.  Agree with admit. Rule out for myocardial infarction. Continue current medical therapy. Continue cardiac studies. Recommend a functional study. Agree with echocardiogram. Continue nitrates. Continue aspirin.  2.  For Afib, continue Eliquis for anticoagulation. Continue metoprolol for rate control.  3.  For palpitations, I agree with continued metoprolol as necessary for palpitations and rate control.  4.  For Afib, I agree with continued Eliquis. 5.  For GERD, continue Protonix therapy.  6.  Hypertension, continue lisinopril and aspirin.  7.  For hyperlipidemia, continue omega-3  fatty acids.  8.  Continue current therapy. See how the patient does. Again, functional study may be helpful for evaluation of chest pain and possible angina.  ____________________________ Loran Senters. Clayborn Bigness, MD ddc:np D: 10/20/2013 14:59:00 ET T: 10/20/2013 17:29:34 ET JOB#: [id number]  cc: Dwayne D. Clayborn Bigness, MD, <Dictator> Yolonda Kida MD ELECTRONICALLY SIGNED 11/23/2013 15:13

## 2014-12-09 NOTE — H&P (Signed)
PATIENT NAME:  Joan Hull, NUCCIO MR#:  [id number] DATE OF BIRTH:  August 24, 1925  DATE OF ADMISSION:  10/19/2013  REFERRING PHYSICIAN: Valli Glance. Owens Shark, MD  PRIMARY CARE PHYSICIAN: Venia Carbon, MD  PRIMARY CARDIOLOGIST: Javier Docker. Ubaldo Glassing, MD  CHIEF COMPLAINT: Chest pain and palpitations.   HISTORY OF PRESENT ILLNESS: This is an 81 year old female with significant past medical history of coronary artery disease, CABG in 2012, as well atrial fibrillation with pacemaker insertion in 2013 at [place], on Eliquis for anticoagulation. The patient presents with complaints of chest pain and palpitations. Reports in early morning she had an episode of fast heart racing and palpitations. She took initially Tylenol with no help. Reports that after she developed some chest pain, midsternal, nonradiating, where she took 3 sublingual nitroglycerin, with resolution of her chest pain. The patient denies any nausea, vomiting, sweating, shortness of breath, cough, productive sputum, weakness, dizziness or lightheadedness. As per EMS, the patient was noticed to be tachycardic with heart rate in the 150s, but unfortunately, there are no EKGs that are left by EMS to verify that, but by the time the patient presented here, her heart rate was normal, the EKG showing paced rhythm at 90 beats per minute. Did not have any episodes of tachycardia here, and all her rhythm has been paced so far. Her troponin was negative. Was given 324 mg of aspirin in ED. Hospitalist was requested to admit the patient for further evaluation.   PAST MEDICAL HISTORY:  1. Anxiety and depression. 2. Hyperlipidemia.  3. Atrial fibrillation, on Eliquis. 4. Osteoporosis.  5. Osteoarthritis. 6. Gastroesophageal reflux disease. 7. Coronary artery disease with RCA stent placement, drug-eluting stent by Dr. Saralyn Pilar in July 2012 and CABG x4 vessels at [facility] in 2012.   PAST SURGICAL HISTORY:  1. Cataract extraction.  2. Coronary stent placement RCA  with drug-eluting stent by Dr. Saralyn Pilar in July 2012. 3. CABG x4 vessels at [facility] in January 2012.  4. Pacemaker insertion at [facility] in May 2013.   ALLERGIES: No known drug allergies.   HOME MEDICATIONS:  1. Paroxetine 10 mg oral daily.  2. Lisinopril 2.5 mg oral daily.  3. Sublingual nitroglycerin as needed.  4. Aspirin 81 mg daily.  5. Metoprolol tartrate 25 mg oral 2 times a day.  6. Torsemide 20 mg oral daily.  7. Zolpidem 10 mg oral at bedtime.  8. Protonix 40 mg daily.  9. Eliquis 2.5 mg oral 2 times a day.  10. Omega-3 fish oil 1000 mg oral daily.  11. Probiotic 1 tab daily.   FAMILY HISTORY: Significant for father dying of MI at age of 68, sister had an MI at age of 26 as well and brother had coronary bypass graft in his late 39s.   SOCIAL HISTORY: She used to smoke 1 pack per day for 10 years. She quit in 1964. No alcohol use. Lives at home.   REVIEW OF SYSTEMS:  CONSTITUTIONAL: The patient denies fever, chills, fatigue, weakness.  EYES: Denies blurry vision, double vision, inflammation or glaucoma.  ENT: Denies tinnitus, ear pain, hearing loss, epistaxis or discharge.  RESPIRATORY: Denies cough, wheezing, hemoptysis, dyspnea.  CARDIOVASCULAR: Reports chest pain, currently resolved. Denies any edema or syncope. Reports arrhythmia and palpitations.  GASTROINTESTINAL: Denies nausea, vomiting, diarrhea, abdominal pain, hematemesis, melena.  GENITOURINARY: Denies dysuria, hematuria, renal colic. ENDOCRINE: Denies polyuria, polydipsia, heat or cold intolerance.  HEMATOLOGY: Denies anemia, easy bruising or bleeding diathesis.  INTEGUMENTARY: Denies acne, rash or skin lesion.  MUSCULOSKELETAL: Denies any  cramps, swelling or gout.  NEUROLOGIC: Denies CVA, TIA, headache, dementia, ataxia.  PSYCHIATRIC: Denies anxiety, insomnia or depression.   PHYSICAL EXAMINATION:  VITAL SIGNS: Pulse 60, respiratory rate 22, blood pressure 143/60, saturating 98% on room air.  GENERAL:  Well-nourished female, looks comfortable in bed, in no apparent distress.  HEENT: Head atraumatic, normocephalic. Pupils equally reactive to light. Pink conjunctivae. Anicteric sclerae. Moist oral mucosa.  NECK: Supple. No thyromegaly. No JVD.  CHEST: Good air entry bilaterally. No wheezing, rales, rhonchi.  CARDIOVASCULAR: S1, S2 heard. No rubs, murmurs or gallops.  ABDOMEN: Soft, nontender, nondistended. Bowel sounds present.  EXTREMITIES: No edema. No clubbing. No cyanosis. Pedal and radial pulses +2 bilaterally.  PSYCHIATRIC: Appropriate affect. Awake, alert x3. Intact judgment and insight.  NEUROLOGIC: Cranial nerves grossly intact. Motor is 5 out of 5. No focal deficits.  SKIN: Normal skin turgor. Warm and dry.  LYMPHATIC: No cervical lymphadenopathy.  MUSCULOSKELETAL: No joint effusion or erythema.   PERTINENT LABORATORIES: Glucose 151, BUN 28, creatinine 1.02, sodium 139, potassium 4.1, chloride 106, CO2 25. Troponin 0.02. White blood cells 4.5, hemoglobin 11.3, hematocrit 33.3, platelets 171. INR is 1. EKG showing paced rhythm of 90 beats per minute. Repeat EKG as well showing paced rhythm at 60 beats per minute.   ASSESSMENT AND PLAN:  1. Chest pain, currently appears to be resolved, currently chest pain-free. Reports her chest pain resolved  with nitroglycerin. Most likely her chest pain was provoked by her tachycardia. She was already given 324 mg of aspirin. Has negative troponins. Will continue to cycle her cardiac enzymes. Will monitor her on telemetry. Will consult cardiology service. She is already on aspirin, beta blockers and lisinopril.  2. Palpitations. This most likely is due to arrhythmia. Unfortunately, there is no EKG left by EMS. She is known to have atrial fibrillation, unclear if it was atrial fibrillation with rapid ventricular response. Resolved without any intervention. The patient will be admitted to telemetry unit. Will consult cardiology as the patient might need  pacemaker interrogation. 3. Atrial fibrillation, currently rate controlled, paced rhythm. Continue the patient on Eliquis for anticoagulation. Continue her on metoprolol.  4. Hypertension. Blood pressure acceptable. Continue with lisinopril and metoprolol. 5. Hyperlipidemia. Continue with fish oil.  6. Coronary artery disease. The patient is on aspirin, fish oil, lisinopril, sublingual nitroglycerin and metoprolol. 7. Deep vein thrombosis prophylaxis. The patient is on full-dose anticoagulation with Eliquis.   CODE STATUS: The patient reports she wishes to be full code. Her healthcare power of attorney is her son.   TOTAL TIME SPENT ON ADMISSION AND PATIENT CARE: 55 minutes.   ____________________________ Albertine Patricia, MD dse:lb D: 10/19/2013 07:42:17 ET T: 10/19/2013 09:16:11 ET JOB#: [id number]  cc: Albertine Patricia, MD, <Dictator> Loletta Harper Graciela Husbands MD ELECTRONICALLY SIGNED 10/20/2013 9:07

## 2014-12-09 NOTE — Consult Note (Signed)
Chief Complaint:  Subjective/Chief Complaint Pt doing ok . No worsening cp . Active and ambulating in halls.   VITAL SIGNS/ANCILLARY NOTES: **Vital Signs.:   06-Mar-15 13:08  Vital Signs Type Routine  Temperature Temperature (F) 97.6  Celsius 36.4  Pulse Pulse 64  Respirations Respirations 16  Systolic BP Systolic BP 850  Diastolic BP (mmHg) Diastolic BP (mmHg) 84  Mean BP 96  Pulse Ox % Pulse Ox % 97  Pulse Ox Activity Level  At rest  Oxygen Delivery Room Air/ 21 %  *Intake and Output.:   06-Mar-15 11:30  Grand Totals Intake:  120 Output:      Net:  120 24 Hr.:  480  Oral Intake      In:  120  Percentage of Meal Eaten  100   Brief Assessment:  GEN well developed, well nourished, no acute distress   Cardiac Irregular  murmur present  -- carotid bruits  -- JVD  --Rub   Respiratory normal resp effort   Gastrointestinal Normal   Gastrointestinal details normal Soft   EXTR negative cyanosis/clubbing, negative edema   Lab Results: LabObservation:  05-Mar-15 12:41   OBSERVATION Reason for Test  Cardiology:  05-Mar-15 11:35   Protocol LEXISCAN  Max Work Load 10  Total Exercise Time 61  Max Diastolic BP 66  Max Heart Rate 76  Max Predicted Heart Rate 135  Reason For Termination per lexiscan protocol  ECG interpretation Confirmed by OVERREAD, NOT ([id number]), editor PEARSON, BARBARA ([id number]) on 10/20/2013 1:30:11 PM    12:41   Echo Doppler REASON FOR EXAM:     COMMENTS:     PROCEDURE: [place] - ECHO DOPPLER COMPLETE(TRANSTHOR)  - Oct 20 2013 12:41PM   RESULT: Echocardiogram Report  Patient Name:   GRACYN ALLOR Date of Exam: 10/20/2013 Medical Rec #:  [id number]             Custom1: Date of Birth:  10-26-1925          Height:       68.0 in Patient Age:    81 years           Weight:       102.0 lb Patient Gender: F                  BSA:          1.54 m??  Indications: SOB Sonographer:    Sherrie Sport RDCS Referring Phys: Phillips Climes, S  Summary:  1. Left ventricular  ejection fraction, by visual estimation, is 60 to  65%.  2. Normal global left ventricular systolic function.  3. Mild left ventricular hypertrophy.  4. Moderately dilated left atrium.  5. Moderately dilated right atrium.  6. Severe tricuspid regurgitation.  7. Moderate to severe mitral valve regurgitation.  8. Moderate to severe aortic regurgitation.  9. Moderately elevated pulmonary artery systolic pressure. 10. Mildly increased left ventricular posterior wall thickness. 2D AND M-MODE MEASUREMENTS (normal ranges within parentheses): Left Ventricle:          Normal IVSd (2D):      1.20 cm (0.7-1.1) LVPWd (2D):     1.18 cm (0.7-1.1) Aorta/LA:                  Normal LVIDd (2D):  3.74 cm (3.4-5.7) Aortic Root (2D): 2.80 cm (2.4-3.7) LVIDs (2D):     2.38 cm           Left Atrium (2D): 4.70 cm (1.9-4.0) LV FS (2D):  36.4 %   (>25%) LV EF (2D):     66.9 %   (>50%)                                   Right Ventricle:                              RVd (2D):        1.61 cm LV DIASTOLIC FUNCTION: MV Peak E: 1.43 m/s E/e' Ratio: 21.00                     Decel Time: 142 msec SPECTRAL DOPPLER ANALYSIS (where applicable): Mitral Valve: MV P1/2 Time: 41.18 msec MVArea, PHT: 5.34 cm?? Aortic Valve: AoV Max Vel: 1.48 m/s AoV Peak PG: 8.8 mmHg AoV Mean PG: LVOT Vmax: 0.99 m/s LVOT VTI:  LVOT Diameter: 2.00 cm AoV Area, Vmax: 2.10 cm?? AoV Area, VTI:  AoV Area, Vmn: Aortic Insufficiency: AI Half-time:  416 msec AI Decel Rate: 2.85 m/s?? Tricuspid Valve and PA/RV Systolic Pressure: TR Max Velocity: 3.47 m/s RA  Pressure: 5 mmHg RVSP/PASP: 53.1 mmHg Pulmonic Valve: PV Max Velocity: 1.14 m/s PV Max PG: 5.2 mmHg PV Mean PG: Pulmonic Insufficiency: PI EDV: 0.86 m/s PADP: 7.9 mmHg  PHYSICIAN INTERPRETATION: Left Ventricle: The left ventricular internal cavity size was normal. LV  posterior wall thickness was mildly increased. Mild left ventricular  hypertrophy. Global LV systolic function was  normal. Left ventricular  ejection fraction, by visual estimation, is 60 to 65%. Right Ventricle: The right ventricular size is normal. Left Atrium: The left atrium is moderately dilated. Right Atrium: The right atrium is moderately dilated. Pericardium: There is no evidence of pericardial effusion. Mitral Valve: Moderate to severe mitral valve regurgitation is seen. Tricuspid Valve: Severe tricuspid regurgitation is visualized. The  tricuspid regurgitant velocity is 3.47 m/s, and with an assumed right  atrial pressure of 5 mmHg, the estimated right ventricular systolic  pressure is moderately elevated at 53.1 mmHg. Aortic Valve: Moderate to severe aortic valve regurgitation is seen. Aorta: The aortic root is normal in size and structure. Additional Comments: A pacer wire is visualized.  [place] MD Electronically signed by [id number] Isaias Cowman MD Signature Date/Time: 10/20/2013/4:30:57 PM  *** Final ***  IMPRESSION: .    Verified BySheppard Coil . PARASCHOS, M.D., MD  Routine Chem:  05-Mar-15 05:23   Cholesterol, Serum 184  Triglycerides, Serum 112  HDL (INHOUSE)  64  VLDL Cholesterol Calculated 22  LDL Cholesterol Calculated 98 (Result(s) reported on 21 Oct 2013 at 10:16AM.)  Glucose, Serum 90  BUN  29  Creatinine (comp) 1.03  Sodium, Serum 140  Potassium, Serum 4.2  Chloride, Serum  110  CO2, Serum 26  Calcium (Total), Serum 9.0  Anion Gap  4  Osmolality (calc) 285  eGFR (African American)  57  eGFR (Non-African American)  50 (eGFR values <76mL/min/1.73 m2 may be an indication of chronic kidney disease (CKD). Calculated eGFR is useful in patients with stable renal function. The eGFR calculation will not be reliable in acutely ill patients when serum creatinine is changing rapidly. It is not useful in  patients on dialysis. The eGFR calculation may not be applicable to patients at the low and high extremes of body sizes, pregnant women, and  vegetarians.)  Magnesium, Serum 2.0 (1.8-2.4 THERAPEUTIC RANGE: 4-7 mg/dL TOXIC: > 10  mg/dL  -----------------------)  Routine Hem:  05-Mar-15 05:23   WBC (CBC) 4.8  RBC (CBC)  3.57  Hemoglobin (CBC)  10.8  Hematocrit (CBC)  32.0  Platelet Count (CBC) 169  MCV 90  MCH 30.2  MCHC 33.7  RDW 14.2  Neutrophil % 55.5  Lymphocyte % 22.5  Monocyte % 15.1  Eosinophil % 6.3  Basophil % 0.6  Neutrophil # 2.6  Lymphocyte # 1.1  Monocyte # 0.7  Eosinophil # 0.3  Basophil # 0.0 (Result(s) reported on 20 Oct 2013 at [facility].)   Radiology Results: Cardiology:    04-Mar-15 04:56, ED ECG  Ventricular Rate 90  Atrial Rate 119  QRS Duration 164  QT 276  QTc 337  P Axis 10  R Axis 54  T Axis -23  ECG interpretation   Demand pacemaker; interpretation is based on intrinsic rhythm  Sinus tachycardia with 2nd degree A-V block (Mobitz I) with 2:1 A-V conduction with Premature ventricular complexes or Fusion complexes  Non-specific intra-ventricular conduction block  Abnormal ECG  When compared with ECG of 19-Oct-2013 04:55,  Sinus rhythm has replaced Wide QRS rhythm  Vent. rate has increased BY  30 BPM  ----------unconfirmed----------  Confirmed by OVERREAD, NOT ([id number]), editor PEARSON, BARBARA ([id number]) on 10/19/2013 10:08:48 AM  ED ECG     05-Mar-15 11:35, Stress Test For Nuclear  Protocol LEXISCAN  Max Work Load 10  Total Exercise Time 61  Max Diastolic BP 66  Max Heart Rate 76  Max Predicted Heart Rate 135  Reason For Termination   per lexiscan protocol  ECG interpretation   Confirmed by OVERREAD, NOT ([id number]), editor PEARSON, BARBARA ([id number]) on 10/20/2013 1:30:11 PM  Stress Test     05-Mar-15 12:41, Echo Doppler  Echo Doppler   REASON FOR EXAM:      COMMENTS:       PROCEDURE: [place] - ECHO DOPPLER COMPLETE(TRANSTHOR)  - Oct 20 2013 12:41PM     RESULT: Echocardiogram Report    Patient Name:   SIREEN HALK Date of Exam: 10/20/2013  Medical Rec #:  [id number]             Custom1:  Date  of Birth:  Jun 22, 1926          Height:       68.0 in  Patient Age:    78 years           Weight:       102.0 lb  Patient Gender: F                  BSA:          1.54 m??    Indications: SOB  Sonographer:    Sherrie Sport RDCS  Referring Phys: Phillips Climes, S    Summary:   1. Left ventricular ejection fraction, by visual estimation, is 60 to   65%.   2. Normal global left ventricular systolic function.   3. Mild left ventricular hypertrophy.   4. Moderately dilated left atrium.   5. Moderately dilated right atrium.   6. Severe tricuspid regurgitation.   7. Moderate to severe mitral valve regurgitation.   8. Moderate to severe aortic regurgitation.   9. Moderately elevated pulmonary artery systolic pressure.  10. Mildly increased left ventricular posterior wall thickness.  2D AND M-MODE MEASUREMENTS (normal ranges within parentheses):  Left Ventricle:          Normal  IVSd (2D):      1.20 cm (0.7-1.1)  LVPWd (2D):     1.18 cm (0.7-1.1) Aorta/LA:                  Normal  LVIDd (2D):  3.74 cm (3.4-5.7) Aortic Root (2D): 2.80 cm (2.4-3.7)  LVIDs (2D):     2.38 cm           Left Atrium (2D): 4.70 cm (1.9-4.0)  LV FS (2D):     36.4 %   (>25%)  LV EF (2D):     66.9 %   (>50%)                                    Right Ventricle:                               RVd (2D):        1.27 cm  LV DIASTOLIC FUNCTION:  MV Peak E: 1.43 m/s E/e' Ratio: 21.00                      Decel Time: 142 msec  SPECTRAL DOPPLER ANALYSIS (where applicable):  Mitral Valve:  MV P1/2 Time: 41.18 msec  MVArea, PHT: 5.34 cm??  Aortic Valve: AoV Max Vel: 1.48 m/s AoV Peak PG: 8.8 mmHg AoV Mean PG:  LVOT Vmax: 0.99 m/s LVOT VTI:  LVOT Diameter: 2.00 cm  AoV Area, Vmax: 2.10 cm?? AoV Area, VTI:  AoV Area, Vmn:  Aortic Insufficiency:  AI Half-time:  416 msec  AI Decel Rate: 2.85 m/s??  Tricuspid Valve and PA/RV Systolic Pressure: TR Max Velocity: 3.47 m/s RA   Pressure: 5 mmHg RVSP/PASP: 53.1 mmHg  Pulmonic  Valve:  PV Max Velocity: 1.14 m/s PV Max PG: 5.2 mmHg PV Mean PG:  Pulmonic Insufficiency:  PI EDV: 0.86 m/s PADP: 7.9 mmHg    PHYSICIAN INTERPRETATION:  Left Ventricle: The left ventricular internal cavity size was normal. LV   posterior wall thickness was mildly increased. Mild left ventricular   hypertrophy. Global LV systolic function was normal. Left ventricular   ejection fraction, by visual estimation, is 60 to 65%.  Right Ventricle: The right ventricular size is normal.  Left Atrium: The left atrium is moderately dilated.  Right Atrium: The right atrium is moderately dilated.  Pericardium: There is no evidence of pericardial effusion.  Mitral Valve: Moderate to severe mitral valve regurgitation is seen.  Tricuspid Valve: Severe tricuspid regurgitation is visualized. The   tricuspid regurgitant velocity is 3.47 m/s, and with an assumed right   atrial pressure of 5 mmHg, the estimated right ventricular systolic   pressure is moderately elevated at 53.1 mmHg.  Aortic Valve: Moderate to severe aortic valve regurgitation is seen.  Aorta: The aortic root is normal in size and structure.  Additional Comments: A pacer wire is visualized.    [place] MD  Electronically signed by [id number] Isaias Cowman MD  Signature Date/Time: 10/20/2013/4:30:57 PM    *** Final ***    IMPRESSION: .        Verified BySheppard Coil . PARASCHOS, M.D., MD  Nuclear Med:    05-Mar-15 12:57, NM MYOCARDIAL SCAN  NM MYOCARDIAL SCAN   REASON FOR EXAM:    chest pain  COMMENTS:       PROCEDURE: NM  - NM MYOCARDIAL SCAN  - Oct 20 2013 12:57PM  RESULT: Nuclear Cardiology Report    Patient Demographics         Name: KADRA KOHAN        Study Date: 10/20/2013   Patient ID: [id number]                  Report Date: 10/20/2013          DOB: June 15, 1926 Age: 83 years       Height:          Sex: F                             Weight:  Accession #: [id number]                        Race:     Ordering Physician: MD Albertine Patricia  Diagnosing Physician: [id number] Kathlyn Sacramento MD  Indications  The patient was imaged for the following indications:  Assessment of acute chest pain.    Clinical History  [age] year old F with known coronary artery disease.  Cardiac risk factors include: hypertension, hypercholesterolemia, history   of coronary artery disease in the family and age.  Images were obtained using Rest Tc-21m/stress Tc-16m 1 day protocol.    Procedure  Pharm stress protocol was followed due to the patient has a LBBB or   pacemaker. Patient was injected with .4 mg Regadenoson.  The test was terminated due to End of Protocol. The heart rate was 60   beats per minute at rest and increased to 76 beats at peak exercise,     which was 56 % of the maximum predicted heart rate. The resting blood   pressure was 145/33 mm/Hg and the stress blood pressure was 148/71mm/Hg.  Myocardial perfusion imaging was performed following the injection of   13.128 MCi of 40mTc Sestamibi at 10:51.  The patient was injected with 28.823 MCi of 27mTcSestamibi at 11:41 for   the stress portion of the exam.    Stress Test Findings    The following table summarizes the findings:  +-------------+-----------------+-------------------+                STRESS EKG DATA    REST EKG DATA     +-------------+-----------------+-------------------+  Test Status  Normal           Abnormal             +-------------+-----------------+-------------------+  Rhythm       Sinus tachycardiaNormal sinus rhythm  +-------------+-----------------+-------------------+  IV ConductionNormal           Normal               +-------------+-----------------+-------------------+  Arrhythmias                   None                 +-------------+-----------------+-------------------+  ST Response  Normal             +-------------+-----------------+-------------------+  Possible old anterior MI.    Overall  Impression  The overall study imaging quality was deemed to be good.  The left ventricular global function was normal.  This myocardial perfusionscan showed evidence of pathology and has an   abnormal appearance. The stress to rest volume ratio is 1.12 for the left     ventricle.  Pharmacological myocardial perfusion study with large region of severe   ischemia  in the anterior, anterolateral and lateral territory.  There is GI uptake artifact noted on this study.  No significant wall motion abnormality noted.  The estimated ejection fraction is 71%.  There are no EKG changes concerning for ischemia.  Overall, this is a High risk scan.  Clinical correlation is recommended.    Summary   1. Clinical correlation is recommended. There is severe GI uptake on   rest images which significantly decreases specificity of abnormal   findings.   2. No significant wall motion abnormality noted.   3. Overall, this is a High risk scan.   4. Pharmacological myocardial perfusion study with region of severe   ischemia in the anterior, anterolateral and lateral territory.   5. Possible old anterior MI on baseline EKG.   6. The estimated ejection fraction is 71%.   7. The left ventricular global function was normal.   8. There are no EKG changes concerning for ischemia with Lexiscan.   9. There is GI uptake artifact noted on this study.  Diagnosing Physician: [id number] Kathlyn Sacramento MD  Electronically signed at 2:18:17 PM on 10/20/2013    *** Final ***    IMPRESSION: .    Verified By: Rogue Jury A. Fletcher Anon, M.D., MD   Assessment/Plan:  Assessment/Plan:  Assessment IMP CP Angina? Abn Functional study HTN AFIB CAD .   Plan PLAN ASA 81 daily Statin therapy Resume Eliquis for AFIB Continue Bp control Agree with rate control for AFIB Consider cath as outpt Rec  medical therapy for now F/U with Fath as outpt   Electronic Signatures: Yolonda Kida (MD)  (Signed 06-Mar-15  15:25)  Authored: Chief Complaint, VITAL SIGNS/ANCILLARY NOTES, Brief Assessment, Lab Results, Radiology Results, Assessment/Plan   Last Updated: 06-Mar-15 15:25 by Lujean Amel D (MD)

## 2014-12-10 NOTE — Consult Note (Signed)
PATIENT NAME:  Joan Hull, Joan Hull MR#:  [id number] DATE OF BIRTH:  [birth year]  DATE OF CONSULTATION:  12/08/2011  CONSULTING PHYSICIAN:  Allyne Gee, MD  REASON FOR CONSULTATION: Pulmonary fibrosis.   HISTORY OF PRESENT ILLNESS: The patient is an 81 year old female who has a history of coronary artery disease who has had a coronary artery bypass graft done at [facility]. The patient came in because of syncope and was admitted on 12/07/2011. She states that she has no prior diagnosis of any kind of lung disease. She was a smoker a very, very long time ago in about 1960; and she only had smoked at that time for 3 or 4 years. She was seen and evaluated. A CT scan was done to look for PE, and what was noted was pulmonary fibrosis. She denies having any cough. She denies any current chest pain. She has no wheezing noted. What she does tell me is that her work history includes working in the [employer] and working with [employer].   PAST MEDICAL HISTORY:  1. Atrial fibrillation. 2. Coronary disease. 3. Hypertension. 4. Anxiety disorder.   PAST SURGICAL HISTORY: As above.   SOCIAL HISTORY: Nonsmoker, nondrinker.   FAMILY HISTORY: Positive for heart disease.   MEDICATIONS: The medications are reviewed on the Electronic Medical Record.   REVIEW OF SYSTEMS: A twelve-point review of systems was done and shows other positives for arthritis and the recent syncopal episode, otherwise as noted above in the history of present illness.   PHYSICAL EXAMINATION:  GENERAL: At the time she was seen, she was awake and alert. Temperature was 97.4, pulse 46, respiratory rate 30, blood pressure is 116/72. Saturations were 95% on 2 liters.  NECK: Neck is supple. No JVD. No adenopathy. No thyromegaly.   CHEST:  No rhonchi. There were some dry rales.   CARDIOVASCULAR: S1, S2 is normal. Regular rhythm. No gallop or rub.   ABDOMEN: Soft, nontender.   EXTREMITIES: Without cyanosis or  clubbing. There were some arthritic deformities.   NEUROLOGICAL:  She  was awake and alert, moving all four extremities.   SKIN: Without any acute rashes.   MUSCULOSKELETAL: As noted above.   LABORATORY DATA: Her hemoglobin was 10.5. White count 8.9. The patient's BUN was 37, creatinine 1, glucose 125, sodium 142, potassium 3.9. The patient's CPK was negative. Troponin was slightly elevated at 0.34.   IMPRESSION: Pulmonary fibrosis.   PLAN: This is likely related to underlying exposure and occupational environment. The patient will be continued on oxygen therapy full support right now. She will get PFTs done which can be done as an outpatient. She should see me back in the office in 1 to 2 weeks after discharge from the hospital. We will continue with supportive care and follow along. Further recommendations as warranted.  ____________________________ Allyne Gee, MD sak:cbb D: 12/08/2011 14:39:31 ET T: 12/08/2011 15:27:20 ET JOB#: [id number]  cc: Allyne Gee, MD, <Dictator> Allyne Gee MD ELECTRONICALLY SIGNED 12/25/2011 13:27

## 2014-12-10 NOTE — Consult Note (Signed)
PATIENT NAME:  Joan Hull, Joan Hull MR#:  [id number] DATE OF BIRTH:  Aug 17, 1926  DATE OF CONSULTATION:  12/07/2011  REFERRING PHYSICIAN:  Eloise Levels, MD CONSULTING PHYSICIAN:  Joan D. Clayborn Bigness, MD  PRIMARY CARE PHYSICIAN: Joan Simpler, MD  INDICATION: Syncope, shortness of breath, known coronary artery disease.  HISTORY OF PRESENT ILLNESS: Joan Hull is an 81 year old female with significant coronary artery disease and recently had coronary artery bypass surgery at [facility] in January for multivessel disease. She has other medical problems that include myocardial infarction, interstitial cystitis, hypertension, and history of angioplasty. The patient reportedly has had progressive shortness of breath and dyspnea on exertion thought to be related to coronary artery disease. She underwent cardiac catheterization and evaluation by Joan Hull and found to have multivessel disease and was transferred to [facility] and had coronary artery bypass surgery, multivessel, in the interim. She has since be at home recovering but is complaining of persistent shortness of breath and dyspnea on exertion. She reportedly had an episode of significant shortness of breath and had a syncopal episode, she thinks related to her shortness of breath, and was admitted for further evaluation and care.   REVIEW OF SYSTEMS: Positive for blackout spell. Positive syncope. No nausea, vomiting, or fever. No chills or no sweats. No weight loss. No weight gain. No hemoptysis or hematemesis. No bright red blood per rectum. No vision change or hearing change. Denies sputum production or cough.  PAST MEDICAL HISTORY:  1. Myocardial infarction.  2. Coronary artery disease.  3. Hypertension.  4. Interstitial nephritis.  5. Hyperlipidemia.  6. Atrial fibrillation in the past.  PAST SURGICAL HISTORY: Coronary artery bypass graft with history of angioplasty and stenting.   FAMILY HISTORY: Positive for hypertension.   SOCIAL HISTORY:  Retired. Denies smoking or alcohol consumption.   MEDICATIONS:  1. Paroxetine 10 mg a day.  2. Nitroglycerin sublingual p.r.n.  3. Metoprolol twice a day.  4. Lisinopril 2.5 mg a day.  5. Coumadin once a day.  6. Aspirin 81 mg a day.  7. Ambien 10 mg a day.    ALLERGIES: No known drug allergies.   PHYSICAL EXAMINATION:   VITAL SIGNS: On admission blood pressure was 166/80, pulse 70, respiratory rate 18, and afebrile.   HEENT: Normocephalic, atraumatic. Pupils equal and reactive to light.   NECK: Supple. No jugular venous distention, bruits, or adenopathy.   LUNGS: Clear to auscultation and percussion. No significant wheeze, rhonchi, or rale.   HEART: Slightly irregular, irregular. Systolic ejection murmur at the apex.   ABDOMEN: Benign.   EXTREMITIES: Examination is within normal limits.   NEUROLOGIC: Examination is intact.   SKIN: Normal.   LABS/STUDIES: MET-B: Glucose 211, BUN 45, creatinine 1.5, sodium 130, and potassium 4.3. Troponin 0.34, total CK was normal but MB was up at 5.6. CBC: Hemoglobin and hematocrit 10.5 and 32, white count 8.9, and platelet count 313.   CT of the head was unremarkable.   Chest x-ray: Interstitial prominence compatible with interstitial infiltrates versus edema. Small effusion. Possible pulmonary fibrosis.   ASSESSMENT:  1. Syncope.  2. Dyspnea.  3. Coronary artery disease. 4. Abnormal chest x-ray. 5. History of atrial fibrillation.  6. Hyperlipidemia.   PLAN: Agree with admit. Rule out for myocardial infarction. Continue further evaluation. Continue CT of the chest. Would probably recommend pulmonary consult to help evaluate her dyspnea since it appears it may not be completely cardiac. Continue anticoagulation for atrial fibrillation. Rate control appears to be reasonably adequate. Hypertension control. We  will try to further evaluate her dyspnea and her syncope. Would consider whether neurology input will be helpful. Base further  evaluation on how she responds. ____________________________ Joan Senters Clayborn Bigness, MD ddc:slb D: 12/08/2011 06:41:59 ET T: 12/08/2011 09:16:09 ET JOB#: [id number]  cc: Joan D. Clayborn Bigness, MD, <Dictator> Joan Kida MD ELECTRONICALLY SIGNED 12/24/2011 22:54

## 2014-12-10 NOTE — Discharge Summary (Signed)
PATIENT NAME:  Joan Hull, Joan Hull MR#:  [id number] DATE OF BIRTH:  1926-08-11  DATE OF ADMISSION:  08/19/2011 DATE OF DISCHARGE:  08/21/2011  DIAGNOSES: Non-ST-segment elevation myocardial infarction due to three vessel coronary artery disease, hypertension, anxiety, history of paroxysmal atrial fibrillation.   DISPOSITION: The patient is being discharged home. Follow up with Dr Saralyn Pilar and primary care physician, Dr [name], in 1 to 2 weeks after discharge.    DIET: Low sodium.   ACTIVITY: No exertional activity.   MEDICATIONS:   1. Aspirin 81 mg daily.  2. Paroxetine 5 mg daily.  3. Ambien 10 mg at bedtime.   4. Cardizem 180 mg daily.  5. Nitroglycerin p.r.n.  6. Imdur 30 mg daily.  7. Lisinopril 2.5 mg daily.  8. The patient has been advised to hold her Plavix.   CONSULTATIONS: Cardiology consultation with Dr. Saralyn Pilar.    PROCEDURES DONE: Catheterization which showed three-vessel coronary artery disease. Chest x-ray: No acute cardiopulmonary disease. CBC normal. Cardiac enzymes ranging from 2.03 to 1.30. Normal CBC and complete metabolic panel.     HOSPITAL COURSE: The patient is an 81 year old female with past medical history of anxiety, hypertension, and paroxysmal atrial fibrillation who presented with chest pain. The patient was found to have a NSTEMI.  She underwent a catheterization which showed multiple three-vessel coronary artery disease. The patient was referred to [facility] for coronary artery bypass graft by Dr. Saralyn Pilar. The patient has been advised to hold her Plavix.  Patient will hear from [facility] in 1 to 2 weeks after discharge. She has been advised to continue her aspirin and Imdur but hold her Plavix.  She cannot be given any beta blocker or statins because she is intolerant to them but the patient's medical problems were stable during the hospitalization, and she is being discharged home in a stable condition.   TIME SPENT: 45 minutes     ____________________________ Cherre Huger, MD sp:vtd D: 08/21/2011 16:02:08 ET T: 08/23/2011 16:31:44 ET JOB#: [id number]  cc: Cherre Huger, MD, <Dictator> Joan Carbon, MD Cherre Huger MD ELECTRONICALLY SIGNED 08/24/2011 13:17

## 2014-12-10 NOTE — H&P (Signed)
PATIENT NAME:  Joan Hull, Joan Hull MR#:  [id number] DATE OF BIRTH:  1926/07/17  DATE OF ADMISSION:  12/10/2011  REFERRING PHYSICIAN: ER physician, Dr. Roxine Caddy   PRIMARY CARE PHYSICIAN: Dr. [name]   CARDIOLOGIST: Dr. Saralyn Pilar    CHIEF COMPLAINT: Syncope.   HISTORY OF PRESENT ILLNESS: The patient is an 81 year old female with past medical history of paroxysmal atrial fibrillation, coronary artery disease, anxiety, and hypertension who was discharged from [facility] on 12/10/2011 after being admitted for a syncopal episode. After discussion with Dr. Saralyn Pilar, it appears that the patient was admitted for junctional rhythm with heart rate in the 30's at that time. The patient was on Cardizem as an outpatient for paroxysmal atrial fibrillation with tachycardia and heart rate in the 120's. She presented during her last admission with junctional bradycardia with heart rate in the 30's which was felt to be due to Cardizem. The patient's Cardizem was discontinued and thereafter the patient remained asymptomatic. She reports that since she has been discharged she has been feeling weak, tired, and dizzy. Today the patient had a syncopal episode and fell and sustained a right periorbital hematoma. Her INR is therapeutic. She continues to endorse weakness and dizziness.   ALLERGIES: Aspirin due to interstitial nephritis but she is on a daily aspirin. She is intolerant of statins.    PAST MEDICAL HISTORY:  1. Paroxysmal atrial fibrillation for which she was on Cardizem which was discontinued during her last admission due to junctional bradycardia. She is also on Coumadin. INR is therapeutic.  2. Coronary artery disease status post four-vessel bypass at [facility] 08/2011.   3. Anxiety. 4. Hypertension.   MEDICATIONS: The patient was discharged home on the following medications: 1. Imdur 30 mg daily.  2. Ambien 10 mg at bedtime. 3. Aspirin 81 mg daily.  4. Coumadin 2 mg daily.  5. Lisinopril 2.5 mg daily.   6. Nitroglycerin p.r.n.  7. Paroxetine 5 mg daily.   FAMILY HISTORY: Mother died from complications of heart disease. Father also had heart disease.   SOCIAL HISTORY: There is no history of smoking, alcohol, or drug abuse. She lives alone.  REVIEW OF SYSTEMS: CONSTITUTIONAL: The patient reports fatigue and weakness. EYES: Denies or blurred or double vision. She has right periorbital hematoma. ENT: Denies any tinnitus or ear pain. RESPIRATORY: Denies any cough or wheezing. CARDIOVASCULAR: Denied any chest pain or palpitations. Reports syncope. GI: Denies any nausea, vomiting, diarrhea, or abdominal pain. GU: Denies any dysuria or hematuria. ENDOCRINE: Denies any polyuria or nocturia. HEME/LYMPH: Denies any anemia or easy bruisability. INTEGUMENTARY: Denies any acne or rash. MUSCULOSKELETAL: Denies any swelling or gout. NEUROLOGICAL: Denies any tremor or vertigo. PSYCH: Has history of anxiety and depression.   PHYSICAL EXAMINATION:   VITAL SIGNS: Temperature 95.3, heart rate 53, respiratory rate 20, blood pressure 101/56, pulse oximetry 96% on room air.   GENERAL: The patient is an elderly Caucasian female laying in bed. She is mildly anxious.   HEAD: Atraumatic, normocephalic.   EYES: There is some pallor. No icterus or cyanosis. Pupils equal, round, and reactive to light and accommodation. The patient has a right periorbital hematoma and a small superficial laceration near her right eyebrow which is oozing a little bit.  ENT: Wet mucous membranes. No oropharyngeal erythema or thrush.   NECK: Supple. No masses. No JVD. No thyromegaly or lymphadenopathy.   CHEST WALL: No tenderness to palpation. Not using accessory muscles of respiration. No intercostal muscle retractions.   LUNGS: Bilaterally clear to auscultation. No wheezing,  rales, or rhonchi.   CARDIOVASCULAR: S1, S2 irregularly irregular. There is a systolic murmur. No rubs or gallops.   ABDOMEN: Soft, nontender, nondistended. No  guarding. No rigidity. No organomegaly. Normal bowel sounds.   SKIN: No rashes or lesions other than around the eye.   PERIPHERIES: No pedal edema. 1+ pedal pulses.   MUSCULOSKELETAL: No cyanosis or clubbing.   NEUROLOGICAL: Awake, alert, oriented x3. Nonfocal neurological exam. Cranial nerves grossly intact.   PSYCH: The patient is mildly anxious.   LABORATORY, DIAGNOSTIC, AND RADIOLOGICAL DATA: CT of the cervical spine shows no abnormality.   CT of the head shows no acute abnormalities.   CT of the maxillofacial area shows no acute abnormalities.   White count 12.2, hemoglobin 9.8, hematocrit 30.9, platelets 247, glucose 154, BUN 30, creatinine 1.27, sodium 139, potassium 4.5, chloride 104, CO2 19, calcium 8.4, bilirubin 1, ALP 208, ALT 114, AST 130, total protein 6.4, albumin 3, osmolality 287. Troponin 0.81. INR 2.3.   ASSESSMENT AND PLAN: This is an 81 year old female with past medical history of coronary artery disease, paroxysmal atrial fibrillation on Coumadin, and anxiety who presents with a syncopal episode.  1. Syncope possibly due to bradycardia. The patient has had two EKGs. Both of them show heart rate of 46 on the monitor. Her heart rate is occasionally dipping down in the 30's. She was last admitted to [facility] 12/07/2011 for junctional bradycardia which was felt to be due to Cardizem. The junctional bradycardia had resolved after the Cardizem was discontinued. The patient is currently not on any rate lowering medications. She was being considered for a pacemaker during her last hospitalization but that was deferred since her symptoms and heart rate improved with conservative management. Discussed with Dr. Saralyn Pilar who is going to evaluate the patient. Since the patient's INR is therapeutic, he recommends holding the patient's Coumadin and any other anticoagulants. The patient is going to be evaluated for pacemaker placement.   2. Leukocytosis, possibly reactive. Will monitor  closely.  3. Anemia likely of chronic disease. Last admission hemoglobin was 10.5, currently 9.8. Will monitor closely since the patient has had a fall, has periorbital hematoma, and her INR is therapeutic.  4. Hyperglycemia, possibly reactive, has been high in the past also.  5. Mild dehydration with elevated BUN and mild acidosis. Will hydrate with IV fluids and recheck in a.m.  6. Elevated troponin possibly due to demand ischemia. During her last admission, her troponin level was 0.33, currently her troponin is 0.81. The patient denies any chest pain and has no EKG changes. Will manage conservatively. The patient cannot have aspirin due to recent fall and periorbital hematoma. She cannot have beta-blocker due to low heart rate.  7. Elevated liver function tests. The patient has no abdominal pain. Denies any nausea, vomiting, diarrhea, abdominal pain, or any other GI symptoms. Abdominal exam is benign. Her LFTs in January 2013 were normal. Will check an abdominal ultrasound and recheck levels in a.m.   Reviewed old medical records, discussed with the ED physician, discussed with the patient and her family at bedside the plan of care and management, discussed with Dr. Saralyn Pilar.   TIME SPENT: 75 minutes.   ____________________________ Cherre Huger, MD sp:drc D: 12/10/2011 17:07:00 ET T: 12/10/2011 17:46:26 ET JOB#: [id number]  cc: Cherre Huger, MD, <Dictator> Venia Carbon, MD  Cherre Huger MD ELECTRONICALLY SIGNED 12/11/2011 13:18

## 2014-12-10 NOTE — Consult Note (Signed)
Brief Consult Note: Diagnosis: Syncope secondary to sinus pauses alternating with PAF. NSTEMI.   Patient was seen by consultant.   Consult note dictated.   Comments: REC  Agree with current therapy, cardiac cath when subtherapeutic on warfarin, followed by ppm.  Electronic Signatures: Lorenza Shakir (MD)  (Signed 25-Apr-13 08:21)  Authored: Brief Consult Note   Last Updated: 25-Apr-13 08:21 by Isaias Cowman (MD)

## 2014-12-10 NOTE — Discharge Summary (Signed)
PATIENT NAME:  Joan Hull, Joan Hull MR#:  [id number] DATE OF BIRTH:  02-19-1926  DATE OF ADMISSION:  12/07/2011 DATE OF DISCHARGE:  12/09/2011  PRIMARY CARE PHYSICIAN:  Dr. Viviana Simpler.  PULMONARY: Dr. Devona Konig.  CARDIOLOGY: Dr. Saralyn Pilar.   DISCHARGE DIAGNOSES:  1. Syncope, could be due to bradycardia (junctional rhythm with history of Maze procedure in the past, now rate has been stable off Cardizem and metoprolol).  2. Elevated cardiac enzymes likely due to supply/demand ischemia.  3. Paroxysmal atrial fibrillation, rate is well controlled on anticoagulation with Coumadin. Cardizem and metoprolol stopped due to significant bradycardia. 4. Hypertension, well controlled on lisinopril and Imdur.  5. Anxiety, on Paxil. 6. EKG changes suggestive of junctional bradycardia. Cardiology not recommending any further intervention at this time.  7. Acute renal failure, likely prerenal, improving with hydration.   SECONDARY DIAGNOSES:  1. Paroxysmal atrial fibrillation, on Cardizem and Coumadin.  2. Coronary artery disease, status post coronary artery bypass graft x4 at [facility] in January 2013. 3. Anxiety. 4. Hypertension.   CONSULTATION: Cardiology, Dr. Saralyn Pilar.   LABORATORY, DIAGNOSTIC AND RADIOLOGICAL DATA: CT scan of the head without contrast on the 21st of April showed no acute intracranial pathology. CT scan of the chest for PE protocol on the 21st of April showed no evidence of pulmonary emboli. Cardiomegaly. Emphysematous and interstitial changes within the lungs at the lung bases.   HISTORY AND SHORT HOSPITAL COURSE: The patient is an 81 year old female with above mentioned medical problems who was admitted for suspected syncope and progressive dyspnea on exertion. Please see Dr. Marykay Lex dictated history and physical for further details. Her main admission reason was dyspnea on exertion although while in the hospital she was found to have near fainting spell with heart rate  dropping in 30s and 40s and rhythm strip showing junctional bradycardia and EKG confirming the same with some new left bundle-branch block pattern for which cardiology consultation was obtained. Her metoprolol and Cardizem was stopped. Dr. Saralyn Pilar from cardiology evaluated the patient and recommended stopping the Cardizem and metoprolol. He recommended to observe her 24 hours to make sure she is not having any further syncopal spell. The patient was watched in the Intensive Care the first 24 hours. Subsequently, was transferred to telemetry. Pulmonary consultation was obtained with Dr. Devona Konig considering her dyspnea on exertion who recommended CT scan of the chest which did not show any PE and showed emphysema and pulmonary fibrosis. He recommended outpatient pulmonary function tests and further evaluation as outpatient. On the 23rd of April after discussion with cardiology, the patient was discharged home as she ambulated without much difficulty and her heart rate stayed anywhere from 60 to 94. The patient was already on Coumadin and her INR was therapeutic up to 2.3, so Dr. Saralyn Pilar recommended continuing Coumadin and then have her follow as an outpatient with his office for need of permanent pacemaker evaluation. The patient was in agreement with discharge planning and was discharged home in stable condition.   PERTINENT PHYSICAL EXAMINATION: VITAL SIGNS: On the date of discharge her vital signs are as follows: Temperature 97.9, heart rate 62 per minute, respirations 18 per minute, blood pressure 146/62. She was saturating 97% on room. CARDIOVASCULAR: S1, S2 normal. No murmurs, rubs, or gallop. LUNGS: Clear to auscultation bilaterally. No wheezing, rales, rhonchi or crepitation. ABDOMEN: Soft, benign. NEUROLOGIC: Nonfocal examination. All other physical examination remained at baseline.   DISCHARGE MEDICATIONS:  1. Ambien 10 mg p.o. at bedtime.  2. Nitroglycerin sublingual  every five minutes as  needed.  3. Lisinopril 2.5 mg p.o. daily.  4. Paroxetine 10 mg half tablet p.o. daily. 5. Aspirin 81 mg p.o. daily. 6. Coumadin 2 mg p.o. at bedtime. 7. Imdur 30 mg p.o. daily.   DISCHARGE DIET: Low sodium.   DISCHARGE ACTIVITY: As tolerated.   DISCHARGE INSTRUCTIONS AND FOLLOW-UP:  1. The patient was instructed to follow-up with her primary care physician, Dr. Viviana Simpler, in one to two weeks. 2. She will need follow-up with Dr. Devona Konig in 2 to 4 weeks. 3. She will need follow-up with Dr. Saralyn Pilar in 2 to 3 weeks.  4. She was instructed to stop Cardizem and metoprolol.  5. She will need follow-up PT-INR check on the 25th of April and the 29th of April with results forwarded to primary care physician for Coumadin dose adjustment.  6. She will need basic metabolic panel check on the 29th April for evaluation of her renal function with results forwarded to primary care physician. 7. She was also instructed to follow-up with [facility] Cardiologic surgeon as scheduled as a routine follow-up after coronary artery bypass graft. 8. She was instructed to return to the Emergency Department or call her primary care physician or cardiology if she continues to have any syncopal symptoms or feels dizzy.   The patients discharge planning was in agreement with the patient and cardiology.   TOTAL TIME DISCHARGING THIS PATIENT: 55 minutes.   ____________________________ Lucina Mellow. Manuella Ghazi, MD vss:ap D: 12/13/2011 01:23:16 ET T: 12/13/2011 14:19:44 ET JOB#: [id number]  cc: Lekeya Rollings S. Manuella Ghazi, MD, <Dictator> Venia Carbon, MD Isaias Cowman, MD Allyne Gee, MD [place] MD ELECTRONICALLY SIGNED 12/13/2011 20:23

## 2014-12-10 NOTE — H&P (Signed)
PATIENT NAME:  Joan Hull, Joan Hull MR#:  [id number] DATE OF BIRTH:  01-Sep-1925  DATE OF ADMISSION:  12/07/2011  CHIEF COMPLAINT: Syncope.   HISTORY OF PRESENT ILLNESS: Ms. Goyal is an 81 year old woman with history of coronary artery disease, status post bypass in January who has had progressive dyspnea on exertion and an episode of syncope with collapse today. She states that she has had progressive dyspnea on exertion since prior to her coronary artery bypass grafting in January. This was thought to be the cause of her dyspnea; however, she states that she did not feel any better after the surgery. She has cardiology and primary care followup, and said that she had a transthoracic echocardiogram a couple of weeks ago. Typically when she gets up to walk around and exerts herself in any way she becomes short of breath and lightheaded. Today she felt especially bad and while walking around the grocery store holding onto the cart she felt short of breath, lightheaded, and eventually passed out. She bumped her face on her grocery cart. She denies any chest pains. She does have periodic palpitations and has a known history of paroxysmal atrial fibrillation, but the palpitations are not associated with feelings of dyspnea or lightheadedness. Otherwise, she denies any lower extremity edema, orthopnea, or PND.     REVIEW OF SYSTEMS: GENERAL: No fever, weakness, or fatigue. No weight loss or weight gain. EYES: No diplopia, blurry vision, eye pain, or floaters. ENT: No rhinorrhea, epistaxis, or nasal discharge. No tinnitus or hearing changes. No dysphagia or odynophagia. RESPIRATORY: No wheezing, cough, or pleuritic chest pain. CARDIOVASCULAR: No chest pain, tachycardia, orthopnea, or PND. GI: No nausea, vomiting, diarrhea, or constipation. No melena, hematochezia, or hematemesis. GU: No dysuria, hematuria, nocturia, or incontinence.  MUSCULOSKELETAL: No joint pain, stiffness, redness, or swelling. SKIN: No rashes,  wounds, pruritus, or other skin changes. NEURO: No headaches, weakness, numbness, or tingling. ENDOCRINE: No heat or cold intolerance, polyuria, polydipsia, or polyphagia. HEME: No easy bruising, easy bleeding, anemia, or swollen glands.   PAST MEDICAL HISTORY:  1. Paroxysmal atrial fibrillation for which she is on Coumadin and Cardizem.  2. Coronary artery disease status post four-vessel bypass surgery per her report at [facility] in January of this year.  3. Anxiety.  4. Hypertension.   ALLERGIES: She lists aspirin secondary to history of interstitial nephritis, but is on a daily aspirin. Statins are also listed.   PRIMARY CARE PHYSICIAN: Dr. [name]  CARDIOLOGIST: Dr. Saralyn Pilar   FAMILY HISTORY: Both mother and father are deceased. Her mother died from complications of heart disease as did her father.   SOCIAL HISTORY: No smoking or alcohol. She lives at home by herself. She is brought in today by her daughter, who is her power of attorney and contact person.   HOME MEDICATIONS:  1. Paxil, dose unknown.  2. Ambien 10 mg p.o. at bedtime.  3. Aspirin 81 mg p.o. daily.  4. Lisinopril 2.5 mg p.o. daily.  5. Imdur 30 mg p.o. daily.  6. Cardizem CD 180 mg p.o. daily.  7. Coumadin, dose unknown.  8. Furosemide, dose unknown.  Her pharmacy is [facility] and they are closed today.  PHYSICAL EXAMINATION:  VITAL SIGNS: On admission, temperature 96.9, respiratory rate 18, heart rate is 70, blood pressure 160/83, oxygen saturation is 98% on room air.   GENERAL: She is well-developed, well-nourished, in no acute distress. She is speaking in shortened sentences.   HEENT: Conjunctivae and lids are without erythema or pallor. Pupils equal, round,  reactive to light and accommodation. Ears and nose without lesions or masses. Hearing is intact to whispered voice. Nasal mucosa, septum, and turbinates are without lesions or masses. Lips, teeth, and gums are notable for a laceration, which is approximated and  minimal right lower lip. Otherwise clear oropharynx and oral mucosa are clear. Posterior pharynx is free of exudate.   NECK: Supple. There are no masses. Thyroid is not enlarged. There are no nodules.   RESPIRATORY: She is not in respiratory distress. There are no intercostal retractions or use of accessory muscles. Lungs are clear throughout. No wheezing, rales, or rhonchi.   HEART: Regular, normal S1 and S2. No murmurs, rubs, or gallops. Pedal pulses are 2+ and equal bilaterally.   ABDOMEN: Soft, nontender, nondistended. Liver and spleen are not enlarged. Bowel sounds are normoactive.   LYMPH: Neck and axilla are free of lymphadenopathy.   MUSCULOSKELETAL: She has no clubbing, cyanosis, or edema. Full range of motion with good strength and tone in all four extremities.   SKIN: Skin and subcutaneous tissue are without rashes or lesions. Palpation of the skin reveals no induration or nodules.   NEUROLOGIC: Cranial nerves II through XII are grossly intact. Deep tendon reflexes are 2+ and equal bilaterally. Judgment and insight are intact. The patient is oriented to person, place, and time.   LABORATORY, DIAGNOSTIC, AND RADIOLOGICAL DATA: White blood cell count 8.9, hematocrit is 32, platelets 313, sodium 142, potassium 3.9, chloride 106, bicarbonate 23, BUN 37, creatinine 1.1. Glucose 125. CK 80, MB 3.2, troponin is 0.2 and is chronically elevated. INR 1.6. EKG is normal sinus rhythm with a right bundle branch block but no acute ST or T wave abnormalities. Chest x-ray shows fibrosis versus interstitial edema.   ASSESSMENT AND PLAN:  81 year old woman with paroxysmal atrial fibrillation with a four-vessel CABG at [facility] in January who has progressive dyspnea on exertion despite her surgery and had an episode of syncope today.  1. Syncope: We will not get an echocardiogram, given that she states that she had one two weeks ago. I am consulting cardiology, and hopefully they will have a record of her  most recent echocardiogram. If that was not in fact done, it would likely be worth doing to rule out aortic stenosis or mitral regurgitation. Carotid Dopplers are not indicated, given that you would have to occlude both carotid Dopplers? simultaneously to cause an episode of syncope. At this point she is not orthostatic and is on several blood pressure medications. We will plan to continue her home blood pressure medications, watching her for orthostasis. Arrhythmia is a possibility; she does have known paroxysmal atrial fibrillation. We will maintain her on telemetry.  2. Dyspnea: Likely related to lightheadedness and syncope. We will plan to diuresis her; however, she does not seem grossly volume overloaded. Given that there is a question of pulmonary fibrosis versus interstitial edema on chest x-ray, we will get a high-resolution CT scan with PE protocol. She is subtherapeutic on her INR. Again, she had a transthoracic echocardiogram done two weeks ago, per her report. Getting a cardiology consult. Her troponin is slightly elevated, but has been elevated since January when she had her surgery. We will plan to trend her enzymes to make sure they are not going up.  3. Atrial fibrillation: Again, she is subtherapeutic, but rate controlled. She does not know her Coumadin dose and her pharmacy is closed. We will plan to wait until tomorrow morning and find out her Coumadin dose so that it  can be titrated.   TOTAL TIME SPENT ON CARE AND COORDINATION: 50 minutes.  ____________________________ Stephani Police Owens Shark, MD jlb:bjt D: 12/07/2011 19:31:14 ET T: 12/08/2011 07:13:24 ET JOB#: [id number]  cc: Anderson Malta L. Owens Shark, MD, <Dictator> Venia Carbon, MD Isaias Cowman, MD Osa Craver MD ELECTRONICALLY SIGNED 12/08/2011 20:09

## 2014-12-10 NOTE — Discharge Summary (Signed)
PATIENT NAME:  Joan Hull, Joan Hull MR#:  [id number] DATE OF BIRTH:  06-17-1926  DATE OF ADMISSION:  12/10/2011 DATE OF DISCHARGE:  12/11/2011  PRIMARY CARE PHYSICIAN: Dr. [name]. PRIMARY CARDIOLOGIST: Dr. Saralyn Pilar  REASON FOR ADMISSION: Syncope.  DISCHARGE DIAGNOSES:  1. Cardiac syncope due to symptomatic bradycardia and sinus pauses with probable sick sinus syndrome and tachybrady syndrome. patient in need of permanent pacemaker insertion.  2. Non-ST elevation myocardial infarction.  3. History of coronary artery disease status post recent four vessel coronary artery bypass graft 08/2011 at [facility]. 4. History of atrial fibrillation status post failed Maze procedure now with tachybrady syndrome and cardiac syncope.  5. Periorbital hematoma after syncope-induced fall. 6. Abnormal liver function tests-likely muscular due to myocardial infarction. 7. Acute renal failure.  8. History of hypertension.  9. History of anxiety.  10. Recent hospitalization for syncope with junctional bradycardia, elevated cardiac enzymes at that time felt to be due to demand ischemia, however, patient also had new left bundle branch block with previous electrocardiogram revealing right bundle branch block.   CONSULTATION: Cardiology, Dr. Saralyn Pilar.   DISCHARGE DISPOSITION: [facility] under care of cardiologist, Dr. Charlane Ferretti.   DISCHARGE MEDICATIONS:  1. Oxygen at 2 liters per minute via nasal cannula.  2. Imdur 30 mg p.o. daily.  3. Nitrostat 0.4 mg sublingually every five minutes x3 p.r.n. chest pain.  4. Ambien 5 mg p.o. at bedtime p.r.n. insomnia.  5. Tylenol 325 mg 1 to 2 tablets p.o. every 4 to 6 hours p.r.n. pain.   DISCHARGE CONDITION: Guarded but currently stable.   DISCHARGE ACTIVITY: Bedrest until further advised by physicians and cardiologists at [facility].   DISCHARGE DIET: Low sodium, low fat, low cholesterol.   FOLLOW UP INSTRUCTIONS: Follow up with Dr. Charlane Ferretti in [facility]  cardiology as well as medical team at [facility] upon arrival to [facility].   LABORATORY, DIAGNOSTIC AND RADIOLOGICAL DATA: Portable chest x-ray 12/10/2011: Cardiomegaly. Prior coronary artery bypass graft otherwise no acute cardiopulmonary abnormalities are noted. Lungs are clear.   Maxillofacial CT without contrast 12/10/2011: No evidence of acute osseous abnormalities. No evidence of acute fracture or dislocation.   Noncontrast head CT 12/10/2011: Involutional changes without evidence of acute abnormalities.   CT of the C-spine without contrast 12/10/2011: No CT evidence of acute osseous abnormality. There are degenerative changes noted.   Abdominal ultrasound 12/11/2011: Cannot exclude hepatocellular disease with phasic portal venous flow. Portal vein appears patent. Findings suggest hypoproteinemia with pleural effusions, ascites and gallbladder wall thickening. Cannot exclude chronic pancreatitis. Common bile duct is normal at 4.6 mm. There are no gallstones noted.   EKG 12/10/2011: Atrial fibrillation with RVR, heart rate 149 beats per minute with PVCs, right bundle branch block.   Telemetry monitoring significant for runs of atrial fibrillation with RVR alternating with sinus rhythm with sinus pauses of five seconds or greater.   CK 90, CK-MB 4.5, troponin 0.81 on admission, second troponin 0.76, third set of cardiac enzymes CK 47, CK-MB 64.7, troponin 26.   CBC on admission: WBC 12.2, hemoglobin 9.8, hematocrit 30.9, platelets 347.   WBC normal at the time of discharge with hemoglobin 10, hematocrit 31.1. INR 2.3 on admission as well as discharge.   BUN 30, creatinine 1.27 on admission. BUN 34, creatinine 1.32 on the day of discharge.   Total protein 6.4, albumin 3, total bilirubin 1, AST 130, AST 114, alkaline phosphatase 200 on admission with AST 229, ALT 153, alkaline phosphatase 187 day of discharge.  BRIEF HISTORY/HOSPITAL COURSE: Patient is an 81 year old  female with past medical history of hypertension, coronary artery disease status post four-vessel coronary artery bypass graft at [facility] 08/2011, history of paroxysmal atrial fibrillation status post Maze procedure with recent hospitalization at our facility earlier in the week for syncope due to junctional bradycardia which at that time was felt to be possibly medication induced and she was advised to stop taking metoprolol and Cardizem who was discharged home 12/09/2011 and was readmitted 12/10/2011 with syncope, Please see dictated admission history and physical for pertinent details surrounding the onset of this hospitalization and please see below for further details.   Syncope-Felt to be cardiac in nature likely due to symptomatic bradycardia and caused by cardiac pauses. EKG revealed atrial fibrillation with rapid ventricular response, however, shortly thereafter in the ER telemetry monitoring revealed heart rate dropping to the 30s and 40s. She was not on any rate controlling medications at that time. She did experience a fall with syncope and significant right periorbital erythema and swelling consistent with hematoma and underwent maxillofacial CT as well as head CT and CT of the C-spine all of which were unremarkable for acute abnormalities. Cardiac enzymes were minimally elevated at time of admission and this was felt to be demand ischemia from her bradycardia. She was initially admitted to the telemetry unit and placed on supportive measures and was monitored closely. She was placed on oxygen and nitrate therapy. Negative chronotropic agents were held. She had evidence of acute renal failure and nephrotoxins were held and she was placed on hydration with IV fluids. Overnight she again had runs of atrial fibrillation with significant tachycardia with heart rate running between 180s to 200s and at that time also had some chest and arm pain. She was again given a dose of Cardizem leading to improvement of  her heart rate and resolution of her symptoms thereafter. On 12/11/2011 when seen by myself patient stated that she was not having any active chest or arm pain and was not short of breath or having any heart palpitations and overall stated that she felt better. Per telemetry monitoring she was noted to have atrial fibrillation with bradycardia heart rate 30s to 40s with occasional cardiac pauses up to five seconds and sometimes longer. She was not noted to have any witnessed syncopal episodes during this hospitalization. Overnight, when she had her chest pain her third troponin came back significantly elevated as well as significant increase of CK and CK-MB, consistent with NSTEMI. Patient was seen in consultation by cardiology. She was felt to have sick sinus syndrome with tachybrady syndrome and cardiac syncope from cardiac pause and Dr. Saralyn Pilar recommended permanent pacemaker insertion and also cardiac catheterization for her NSTEMI. She came in with a therapeutic INR since she was on Coumadin. Dr. Saralyn Pilar recommended holding her Coumadin therapy and proceeding with cardiac catheterization and permanent pacemaker insertion once her INR had drifted down. Dr. Saralyn Pilar advised against administering the patient any aspirin or heparin for her myocardial infarction due to significant right periorbital hematoma and increased bleeding risk. Dr. Saralyn Pilar was in agreement with oxygen and nitrate therapy for her myocardial infarction. He recommended close monitoring in the Critical Care Unit for her atrial fibrillation with tachybrady syndrome as well as cardiac pauses. It was felt to be in the patient's best interest to have her transferred to [facility] for permanent pacemaker insertion as well as cardiac catheterization for further assessment of her coronary anatomy as Dr. Saralyn Pilar will be out of town next  week and the earliest he would be able to perform these procedures would be next Thursday and our cardiac  catheterization lab is not open at our facility on the weekends and Dr. Saralyn Pilar recommended transferring patient to [facility] for further assessment and management and felt that it would be patient's best interest to undergo intervention as soon as possible in order to avoid any further delay. Case was discussed with the patient and her husband both of whom were in agreement with being transferred to [facility]. She did also have abnormal liver function tests and this was felt to be more muscular in etiology from her myocardial infarction as per Dr. Saralyn Pilar with abdominal ultrasound results as mentioned above. She will need to have her LFTs closely monitored. There was mention of some possible hepatocellular disease and also some gallbladder wall thickening but no gallstones were noted and she had normal common bile duct caliber. She will need to have her LFTs closely monitored while at [facility]. She was also mildly anemic which will need further work-up but her hemoglobin has remained stable and hemoglobin is 9.8 on admission and 10 on the day of discharge and there are no acute indications for blood transfusion at this time. In summary, patient has cardiac-induced syncope from sick sinus syndrome with tachybrady syndrome and cardiac pauses and will ultimately require permanent pacemaker insertion for which patient will follow up with cardiology team at [facility] and also for her NSTEMI she will need further assessment of her coronary arteries with cardiac catheterization and in the meanwhile medical management is limited at this time and she is not placed on any antiplatelets or anticoagulants given significant right periorbital hematoma, however, currently has a therapeutic INR on Coumadin which should add some protection. Beta blocker contraindicated due to bradycardia and cardiac pauses. Statin contraindicated due to abnormal liver function tests. ACE inhibitor currently contraindicated due to acute renal failure. For now she  will be on oxygen and nitrate therapy and will need continuous telemetry monitoring.   PHYSICAL EXAMINATION: VITAL SIGNS AT THE TIME OF DISCHARGE: Temperature 97.5, pulse 46, blood pressure 124/50, respirations 18, oxygen saturation 98% on 2 liters nasal cannula. GENERAL: Patient alert and oriented, not acutely distressed. HEENT: Normocephalic. Patient has right periorbital erythema and ecchymosis with significant swelling and mild tenderness to palpation consistent with hematoma. Vision is intact. Pupils equal, round, reactive to light and accommodation. Due to significant swelling difficult to assess extraocular muscle function on the right but extraocular muscle function is intact on the left. She has got anicteric sclera on the left. Conjunctivae is pink. Hearing intact to voice. Nares without drainage. Oral mucosa moist without lesions. NECK: Supple with full range of motion. No jugular venous distention, lymphadenopathy, or carotid bruits bilaterally. No thyromegaly or tenderness to palpation over thyroid gland. CHEST: Normal respiratory effort without use of accessory respiratory muscles. Lungs are clear to auscultation bilaterally without crackles, rales, or wheezes. CARDIOVASCULAR: Irregularly irregular, bradycardic. No murmurs, rubs, or gallops. PMI is non-lateralized. ABDOMEN: Soft, nontender, nondistended. Normoactive bowel sounds. No hepatosplenomegaly or palpable masses. No hernias. EXTREMITIES: No clubbing, cyanosis, or edema. Pedal pulses are palpable bilaterally. SKIN: No suspicious rashes other than the swelling and erythema noted in the right periorbital region as well as some bruising. Skin turgor is good. No ulcers. LYMPH: No cervical lymphadenopathy. NEUROLOGICAL: Patient is alert, oriented x3. Cranial nerves II through XII are grossly intact. No focal deficits. PSYCH: Pleasant female with appropriate affect.   TIME SPENT ON DISCHARGE: Greater than 30  minutes.  ____________________________ Romie Jumper, MD knl:cms D: 12/11/2011 12:01:56 ET T: 12/11/2011 12:35:43 ET JOB#: [id number]  cc: Romie Jumper, MD, <Dictator> Isaias Cowman, MD Venia Carbon, M.D. [facility], Cardiology, Dr. Sallyanne Kuster Shandy Vi MD ELECTRONICALLY SIGNED 12/22/2011 15:09

## 2014-12-10 NOTE — Consult Note (Signed)
PATIENT NAME:  Joan Hull, Joan Hull MR#:  [id number] DATE OF BIRTH:  November 03, 1925  DATE OF CONSULTATION:  08/19/2011  PRIMARY CARE PHYSICIAN:  Viviana Simpler, MD  REFERRING PHYSICIAN:  Abel Presto, MD  CONSULTING PHYSICIAN:  Isaias Cowman, MD  CHIEF COMPLAINT: Chest pain.   HISTORY OF PRESENT ILLNESS: The patient is an 81 year old female with known history of coronary artery disease referred for chest pain and elevation of troponin. The patient is status post percutaneous transluminal coronary angioplasty of the first obtuse marginal branch 05/1998 and bare-metal mini Vision stent of the right coronary artery 03/18/2011. The patient was in her usual state of health until 08/18/2011 when she experienced postprandial chest pain. She described as substernal discomfort with radiation down her arm and into her jaw. She also noted tachycardia. The patient took Mylanta, sublingual nitroglycerin which did not seem to help. She followed this with an atenolol, which seemed to help. She took an Ambien and fell asleep. The next afternoon she presented to [facility] Emergency Room at which time she was chest pain free. She was admitted to telemetry. The patient has ruled in for non-ST elevation myocardial infarction with a peak troponin of 2.03.   PAST MEDICAL HISTORY:  1. Status post percutaneous transluminal coronary angioplasty first obtuse marginal branch 05/1998. 2. Status post bare metal Vision stent right coronary artery 03/18/2011.  3. Known three-vessel coronary artery disease.  4. Hypertension.  5. Hyperlipidemia.  6. Atrial fibrillation.   MEDICATIONS:  1. Aspirin 325 mg daily.  2. Plavix 75 mg daily.  3. Cardizem LA 180 mg daily.  4. Lisinopril 2.5 mg daily. 5. Imdur 30 mg daily.  6. Ambien 10 mg at bedtime. 7. Fluoxetine 30 mg daily.  8. Omeprazole 20 mg daily.   SOCIAL HISTORY: The patient is a widow. She currently lives alone. She has one child. She has a remote tobacco abuse history.    FAMILY HISTORY: Father died status post myocardial infarction at age 38 Sister died status post MI at age 57. Brother status post bypass graft surgery in his 1s.  REVIEW OF SYSTEMS:  CONSTITUTIONAL: No fever or chills.   EYES: No vision loss.   EARS: No hearing loss.   RESPIRATORY: The patient denies shortness of breath.   CARDIOVASCULAR: The patient has chest pain and tachycardia as described above.   GASTROINTESTINAL: The patient denies nausea, vomiting, diarrhea, or constipation.   GU: The patient denies dysuria, hematuria.   ENDOCRINE: The patient denies polyuria or polydipsia.   NEUROLOGIC: The patient denies focal muscle weakness or numbness.   PSYCHOLOGICAL: The patient denies anxiety or depression.   PHYSICAL EXAMINATION:  VITAL SIGNS: Blood pressure 104/59, pulse 55, respirations 18, temperature 97.8, pulse oximetry 95%.   HEENT: Pupils equal and reactive to light and accommodation.   NECK: Supple without thyromegaly.   LUNGS: Clear.   HEART: Normal jugular venous pressure. Normal point of maximal impulse. Regular rate and rhythm. Normal S1, S2. No appreciable gallop, murmur, or rub.   ABDOMEN: Soft and nontender. Pulses were intact bilaterally.   MUSCULOSKELETAL: Normal muscle tone.   NEUROLOGIC: The patient is alert and oriented x3. Motor and sensory both grossly intact.   IMPRESSION: This is an 81 year old female with known coronary artery disease who presents with non-ST elevation myocardial infarction, currently appears chest pain free.   RECOMMENDATIONS:  1. Agree with current therapy.  2. Proceed with cardiac catheterization with selective coronary arteriography on 08/20/2011. The risks, benefits, and alternatives were explained and informed written  consent obtained     ____________________________ Isaias Cowman, MD ap:ljs D: 08/20/2011 09:07:58 ET T: 08/20/2011 13:00:04 ET JOB#: [id number]  cc: Isaias Cowman, MD,  <Dictator> Venia Carbon, MD Isaias Cowman MD ELECTRONICALLY SIGNED 08/30/2011 10:04

## 2014-12-10 NOTE — Discharge Summary (Signed)
PATIENT NAME:  Joan Hull, Joan Hull MR#:  [id number] DATE OF BIRTH:  Oct 10, 1925  DATE OF ADMISSION:  12/07/2011 DATE OF DISCHARGE:  12/09/2011  PRIMARY CARE PHYSICIAN:  Dr. Viviana Simpler.  PULMONARY: Dr. Devona Konig.  CARDIOLOGY: Dr. Saralyn Pilar.   DISCHARGE DIAGNOSES:  1. Syncope, could be due to bradycardia (junctional rhythm with history of Maze procedure in the past, now rate has been stable off Cardizem and metoprolol).  2. Elevated cardiac enzymes likely due to supply/demand ischemia.  3. Paroxysmal atrial fibrillation, rate is well controlled on anticoagulation with Coumadin. Cardizem and metoprolol stopped due to significant bradycardia. 4. Hypertension, well controlled on lisinopril and Imdur.  5. Anxiety, on Paxil. 6. EKG changes suggestive of junctional bradycardia. Cardiology not recommending any further intervention at this time.  7. Acute renal failure, likely prerenal, improving with hydration.   SECONDARY DIAGNOSES:  1. Paroxysmal atrial fibrillation, on Cardizem and Coumadin.  2. Coronary artery disease, status post coronary artery bypass graft x4 at [facility] in January 2013. 3. Anxiety. 4. Hypertension.   CONSULTATION: Cardiology, Dr. Saralyn Pilar.   LABORATORY, DIAGNOSTIC AND RADIOLOGICAL DATA: CT scan of the head without contrast on the 21st of April showed no acute intracranial pathology. CT scan of the chest for PE protocol on the 21st of April showed no evidence of pulmonary emboli. Cardiomegaly. Emphysematous and interstitial changes within the lungs at the lung bases.   HISTORY AND SHORT HOSPITAL COURSE: The patient is an 81 year old female with above mentioned medical problems who was admitted for suspected syncope and progressive dyspnea on exertion. Please see Dr. Marykay Lex dictated history and physical for further details. Her main admission reason was dyspnea on exertion although while in the hospital she was found to have near fainting spell with heart rate  dropping in 30s and 40s and rhythm strip showing junctional bradycardia and EKG confirming the same with some new left bundle-branch block pattern for which cardiology consultation was obtained. Her metoprolol and Cardizem was stopped. Dr. Saralyn Pilar from cardiology evaluated the patient and recommended stopping the Cardizem and metoprolol. He recommended to observe her 24 hours to make sure she is not having any further syncopal spell. The patient was watched in the Intensive Care the first 24 hours. Subsequently, was transferred to telemetry. Pulmonary consultation was obtained with Dr. Devona Konig considering her dyspnea on exertion who recommended CT scan of the chest which did not show any PE and showed emphysema and pulmonary fibrosis. He recommended outpatient pulmonary function tests and further evaluation as outpatient. On the 23rd of April after discussion with cardiology, the patient was discharged home as she ambulated without much difficulty and her heart rate stayed anywhere from 60 to 75. The patient was already on Coumadin and her INR was therapeutic up to 2.3, so Dr. Saralyn Pilar recommended continuing Coumadin and then have her follow as an outpatient with his office for need of permanent pacemaker evaluation. The patient was in agreement with discharge planning and was discharged home in stable condition.   PERTINENT PHYSICAL EXAMINATION: VITAL SIGNS: On the date of discharge her vital signs are as follows: Temperature 97.9, heart rate 62 per minute, respirations 18 per minute, blood pressure 146/62. She was saturating 97% on room. CARDIOVASCULAR: S1, S2 normal. No murmurs, rubs, or gallop. LUNGS: Clear to auscultation bilaterally. No wheezing, rales, rhonchi or crepitation. ABDOMEN: Soft, benign. NEUROLOGIC: Nonfocal examination. All other physical examination remained at baseline.   DISCHARGE MEDICATIONS:  1. Ambien 10 mg p.o. at bedtime.  2. Nitroglycerin sublingual  every five minutes as  needed.  3. Lisinopril 2.5 mg p.o. daily.  4. Paroxetine 10 mg half tablet p.o. daily. 5. Aspirin 81 mg p.o. daily. 6. Coumadin 2 mg p.o. at bedtime. 7. Imdur 30 mg p.o. daily.   DISCHARGE DIET: Low sodium.   DISCHARGE ACTIVITY: As tolerated.   DISCHARGE INSTRUCTIONS AND FOLLOW-UP:  1. The patient was instructed to follow-up with her primary care physician, Dr. Viviana Simpler, in one to two weeks. 2. She will need follow-up with Dr. Devona Konig in 2 to 4 weeks. 3. She will need follow-up with Dr. Saralyn Pilar in 2 to 3 weeks.  4. She was instructed to stop Cardizem and metoprolol.  5. She will need follow-up PT-INR check on the 25th of April and the 29th of April with results forwarded to primary care physician for Coumadin dose adjustment.  6. She will need basic metabolic panel check on the 29th April for evaluation of her renal function with results forwarded to primary care physician. 7. She was also instructed to follow-[facility] with [facility] surgeon as scheduled as a routine follow-up after coronary artery bypass graft. 8. She was instructed to return to the Emergency Department or call her primary care physician or cardiology if she continues to have any syncopal symptoms or feels dizzy.   The patients discharge planning was in agreement with the patient and cardiology.   TOTAL TIME DISCHARGING THIS PATIENT: 55 minutes.   ____________________________ Lucina Mellow. Manuella Ghazi, MD vss:ap D: 12/13/2011 01:23:16 ET T: 12/13/2011 14:19:44 ET JOB#: [id number]  cc: Lucio Litsey S. Manuella Ghazi, MD, <Dictator> Venia Carbon, MD Isaias Cowman, MD Allyne Gee, MD [place] MD ELECTRONICALLY SIGNED 12/13/2011 20:23

## 2014-12-10 NOTE — Consult Note (Signed)
PATIENT NAME:  Joan Hull, DARTY MR#:  [id number] DATE OF BIRTH:  01-Mar-1926  DATE OF CONSULTATION:  12/11/2011  REFERRING PHYSICIAN:  Dr. Karsten Fells CONSULTING PHYSICIAN:  Isaias Cowman, MD  PRIMARY CARE PHYSICIAN: Dr. [name].   CHIEF COMPLAINT: I passed out.   HISTORY OF PRESENT ILLNESS: The patient is an 81 year old female with known history of coronary artery disease and paroxysmal atrial fibrillation. The patient underwent recent coronary bypass graft surgery with Maze procedure at [facility]. More recently, the patient has been experiencing episodes of tachycardia. She was recently discharged 12/10/2011 following a syncopal episode at which time she was in junctional rhythm with a heart rate of 30s and 40s, which resolved after discontinuing Cardizem. The patient was discharged home only to return following another syncopal episode. The patient was admitted to telemetry where she has had episodes of atrial fibrillation with rapid ventricular response alternating with sinus rhythm with sinus pauses of five seconds or greater. The patient has also ruled in for non-ST elevation myocardial infarction. She reports that she did have some chest discomfort last evening.   PAST MEDICAL HISTORY:  1. Coronary artery bypass graft surgery x4 at [facility] 08/2011. 2. Paroxysmal atrial fibrillation alternating with junctional bradycardia, previously on Cardizem.  3. Hypertension.  4. Anxiety.   MEDICATIONS ON ADMISSION:  1. Aspirin 81 mg daily.  2. Coumadin 2 mg daily.  3. Imdur 30 mg daily.  4. Lisinopril 2.5 mg daily.  5. Ambien 10 mg at bedtime.  6. Nitroglycerin p.r.n.  7. Paroxetine 5 mg daily.   SOCIAL HISTORY: The patient currently lives alone. She denies tobacco abuse.   FAMILY HISTORY: Father with history of coronary artery disease.   REVIEW OF SYSTEMS: CONSTITUTIONAL: No fever or chills. EYES: No blurry vision. EARS: No hearing loss.  RESPIRATORY: No shortness of breath. CARDIOVASCULAR: The patient does report that she had some chest discomfort last evening. GASTROINTESTINAL: No nausea, vomiting, or diarrhea. GU: No dysuria or hematuria. ENDOCRINE: No polyuria or polydipsia. MUSCULOSKELETAL: No arthralgias or myalgias. NEUROLOGICAL: No focal muscle weakness or numbness. PSYCHOLOGICAL: No depression.   PHYSICAL EXAMINATION:  VITAL SIGNS: Blood pressure 90/56, pulse 42, respirations 16, temperature 97.4, pulse oximetry 95%.   HEENT: Pupils equal, reactive to light and accommodation.   NECK: Supple without thyromegaly.   LUNGS: Clear.   HEART: Normal jugular venous pressure. Normal point of maximal impulse. Regular rate and rhythm. Normal S1, S2. No appreciable gallop, murmur, or rub.   ABDOMEN: Soft and nontender. Pulses were intact bilaterally.   MUSCULOSKELETAL: Normal muscle tone.   NEUROLOGIC: The patient is alert and oriented x3. Motor and sensory both grossly intact.   IMPRESSION: This is an 81 year old female who presents after syncope with right periorbital hematoma. The patient has ruled in for non-ST elevation myocardial infarction. On telemetry, the patient has had significant sinus pauses of greater than five seconds likely to account for syncope. The patient is still therapeutic on warfarin.   RECOMMENDATIONS:  1. Hold warfarin. 2. Once the patient is subtherapeutic, will proceed with cardiac catheterization with selective coronary arteriography.  3. Pending cardiac catheterization results, will likely proceed with permanent pacemaker implantation. 4. More aggressive control of paroxysmal atrial fibrillation once permanent pacemaker has been implanted.    ____________________________ Isaias Cowman, MD ap:ap D: 12/11/2011 08:19:13 ET T: 12/11/2011 10:53:06 ET JOB#: [id number]  cc: Isaias Cowman, MD, <Dictator> Isaias Cowman MD ELECTRONICALLY SIGNED 12/21/2011 9:38

## 2014-12-10 NOTE — H&P (Signed)
PATIENT NAME:  Joan Hull, Joan Hull MR#:  [id number] DATE OF BIRTH:  08-17-1926  DATE OF ADMISSION:  08/19/2011  PRIMARY CARE PHYSICIAN: Viviana Simpler, MD  CARDIOLOGIST: Isaias Cowman, MD  CHIEF COMPLAINT: Chest pain.   HISTORY OF PRESENT ILLNESS: This is an 81 year old female who comes in from home due to chest pain that she had last night. The patient said she developed some chest pain late last night lasting about 3 to 4 hours. She describes it as being more of a pain and also pressure-like symptoms in the center of her chest sometimes radiating down both her arms and also her upper neck. She did develop some palpitations, but no shortness of breath, no nausea or vomiting, and no diaphoresis. She had similar symptoms like this about a month or two ago when she had a non-ST-elevation myocardial infarction. She says that her symptoms are very similar to when she had her non-ST-elevation myocardial infarction in November. She currently is chest pain free and hemodynamically stable. Due to prolonged symptoms last night, she came to the ER for further evaluation. The patient does state that she has a history of paroxysmal atrial fibrillation and when she feels like her heart rate is getting high she will take a dose of a beta blocker, which she did do yesterday.   REVIEW OF SYSTEMS: CONSTITUTIONAL: No documented fever. No weight gain or weight loss. EYES: No blurry or double vision. ENT: No tinnitus or postnasal drip. No redness of the oropharynx. RESPIRATORY: No cough, no wheeze, no hemoptysis, and no dyspnea. CARDIOVASCULAR: Positive chest pain. No orthopnea. Positive palpitations. No syncope. GI: No nausea, vomiting, diarrhea, abdominal pain, melena, or hematochezia. GU: No dysuria or hematuria. ENDOCRINE: No polyuria or nocturia. No heat or cold intolerance. HEME: No anemia, no bruising, and no bleeding. INTEGUMENTARY: No rashes and no lesions. MUSCULOSKELETAL: No arthritis, swelling, or gout.  NEUROLOGIC: No numbness, no tingling, no ataxia, and no seizure-type activity. PSYCH: No anxiety, no insomnia, and no ADD.   PAST MEDICAL HISTORY:  1. Hypertension.  2. History of coronary disease. 3. Anxiety.  4. History of paroxysmal atrial fibrillation.   ALLERGIES: Regular aspirin but not enteric-coated aspirin, also allergic to statins.   SOCIAL HISTORY: No smoking. No alcohol abuse. No illicit drug abuse. She lives at home by herself.   FAMILY HISTORY: Both mother and father are deceased. Mother died from complications of heart disease and so did her father.  CURRENT MEDICATIONS:  1. Paxil 10 mg daily.  2. Ambien 10 mg at bedtime.  3. Ecotrin 325 mg daily.  4. Plavix 75 mg daily.  5. Cardizem CD 180 mg daily.  6. Sublingual nitroglycerin as needed.  7. Imdur 30 mg daily. 8. Lisinopril 2.5 mg daily.   PHYSICAL EXAMINATION:   VITAL SIGNS: Temperature 96.9, pulse is currently regular in the 80s, respiration 20, blood pressure 110/60, and saturation 96% on room air.   GENERAL: Pleasant appearing female in no apparent distress.   HEENT: Atraumatic, normocephalic. Extraocular movements are intact. Pupils are equal and reactive to light. Sclerae anicteric. No conjunctival injection. No pharyngeal erythema.   NECK: Supple. No jugular venous distention. No bruits, no lymphadenopathy, and no thyromegaly.   HEART: Regular rate and rhythm. 2/6 systolic ejection murmur heard at the right sternal border. No rubs or clicks.   LUNGS: Clear to auscultation bilaterally. No rales, no rhonchi, and no wheezes.   ABDOMEN: Soft, flat, nontender, and nondistended. Good bowel sounds. No hepatosplenomegaly is appreciated.   EXTREMITIES:  No evidence of any cyanosis, clubbing, or peripheral edema. +2 pedal and radial pulses bilaterally.   NEUROLOGICAL: Alert, awake, and oriented x3 with no focal motor or sensory deficits appreciated bilaterally.   SKIN: Moist and warm with no rash appreciated.    LYMPHATIC: No cervical or axillary lymphadenopathy.   LABS/STUDIES: Serum glucose 145, BUN 23, creatinine 0.9, sodium 141, potassium 3.8, chloride 105, and bicarbonate 25. LFTs are within normal limits. Troponin 1.3, CK-MB 7.3, and total CK 76. White cell count 5.5, hemoglobin 13.1, and hematocrit 38.7.   Chest x-ray: No acute cardiopulmonary disease.  EKG: Normal sinus rhythm with some PVCs, but no other acute ST or T wave changes. She does have a pre-existing right bundle branch block.   ASSESSMENT AND PLAN: This is an 81 year old female with history of paroxysmal atrial fibrillation, hypertension, history of diffuse coronary artery disease, and anxiety who presents to the hospital with chest pain and noted to have elevated troponins.  1. Unstable angina: This is the likely diagnosis given her chest pain and her elevated troponin, although she has no new EKG changes. We will continue medical management right now with aspirin and Plavix. She is intolerant to beta blockers and statins. Continue Imdur for now. I discussed the case with Dr. Nehemiah Massed who is covering for Dr. Saralyn Pilar. Dr. Saralyn Pilar is to come and evaluate the patient tomorrow. For now continue telemetry and continue serial enzymes pending further plan based on cardiology.  2. Hypertension: Continue Cardizem and lisinopril.  3. Anxiety: Continue Paxil. 4. History of paroxysmal atrial fibrillation: Currently the patient is rate controlled and in normal sinus rhythm. We will continue with Cardizem.      CODE STATUS: FULL CODE.   TIME SPENT: 45 minutes.  ____________________________ Belia Heman. Verdell Carmine, MD vjs:slb D: 08/19/2011 20:38:33 ET T: 08/20/2011 08:13:44 ET JOB#: [id number]  cc: Belia Heman. Verdell Carmine, MD, <Dictator> Venia Carbon, MD Henreitta Leber MD ELECTRONICALLY SIGNED 08/23/2011 13:55

## 2014-12-26 ENCOUNTER — Other Ambulatory Visit: Payer: Self-pay

## 2014-12-26 MED ORDER — ZOLPIDEM TARTRATE 5 MG PO TABS: 5.0000 mg | ORAL_TABLET | Freq: Every evening | ORAL | Status: DC | PRN

## 2014-12-26 NOTE — Telephone Encounter (Signed)
rx called into pharmacy

## 2014-12-26 NOTE — Telephone Encounter (Signed)
Approved: #90 x 0 

## 2014-12-26 NOTE — Telephone Encounter (Signed)
Pt left v/m requesting refill zolpidem to [facility] [address]. Pt last seen 11/07/14 and rx last refilled 09/27/14.Please advise.

## 2015-02-26 ENCOUNTER — Telehealth: Payer: Self-pay | Admitting: Internal Medicine

## 2015-02-26 NOTE — Telephone Encounter (Signed)
[facility]  Patient Name: Joan Hull  DOB: [birth year]    Initial Comment Caller states she is having back spasms.   Nurse Assessment  Nurse: Wynetta Emery, RN, Baker Janus Date/Time Eilene Ghazi Time): 02/26/2015 5:24:57 PM  Confirm and document reason for call. If symptomatic, describe symptoms. ---Joan Hull is having back spasms - like jolting shooting pains up her back. onset one week getting worse could not get up out of bed without a struggle.  Has the patient traveled out of the country within the last 30 days? ---No  Does the patient require triage? ---Yes  Related visit to physician within the last 2 weeks? ---No  Does the PT have any chronic conditions? (i.e. diabetes, asthma, etc.) ---Yes  List chronic conditions. ---Heart Disease     Guidelines    Guideline Title Affirmed Question Affirmed Notes  Back Pain [1] MODERATE back pain (e.g., interferes with normal activities) AND [2] present > 3 days    Final Disposition User   See PCP When Office is Open (within 3 days) Wynetta Emery, RN, Baker Janus    Comments  02-27-2015 1215pm [name], R c/o back spasms   Disagree/Comply: Comply

## 2015-02-27 ENCOUNTER — Ambulatory Visit (INDEPENDENT_AMBULATORY_CARE_PROVIDER_SITE_OTHER): Payer: PPO | Admitting: Internal Medicine

## 2015-02-27 ENCOUNTER — Encounter: Payer: Self-pay | Admitting: Internal Medicine

## 2015-02-27 VITALS — BP 150/70 | HR 60 | Wt 102.0 lb

## 2015-02-27 DIAGNOSIS — M545 Low back pain, unspecified: Secondary | ICD-10-CM

## 2015-02-27 MED ORDER — HYDROCODONE-ACETAMINOPHEN 5-325 MG PO TABS: 0.5000 | ORAL_TABLET | Freq: Four times a day (QID) | ORAL | Status: DC | PRN

## 2015-02-27 NOTE — Progress Notes (Signed)
Subjective:    Patient ID: Joan Hull, female    DOB: Mar 22, 1926, 81 y.o.   MRN: [id number]  HPI Here due to back pain  Has "grab like a streak of lightening" Points across entire upper lumbar region Had problems about a year ago--went to walk in clinic at [facility] and then someone else there  Started suddenly about a week ago No fall or clear injury but may have strained it carrying a gallon of paint a while back  Has used aspercreme and tylenol--- ?some brief help Has elastic brace she is using  Current Outpatient Prescriptions on File Prior to Visit  Medication Sig Dispense Refill  . apixaban (ELIQUIS) 2.5 MG TABS tablet Take 1 tablet (2.5 mg total) by mouth 2 (two) times daily. 180 tablet 3  . BIOTIN PO Take by mouth daily.    . Calcium Citrate-Vitamin D (CITRACAL + D PO) Take by mouth daily.    . digoxin (LANOXIN) 0.25 MG tablet Take 0.25 mg by mouth daily.    Marland Kitchen diltiazem (CARDIZEM CD) 180 MG 24 hr capsule Take 180 mg by mouth daily.   5  . metoprolol (LOPRESSOR) 50 MG tablet Take 0.5 tablets (25 mg total) by mouth 2 (two) times daily. 45 tablet 3  . pantoprazole (PROTONIX) 40 MG tablet Take 1 tablet (40 mg total) by mouth daily. 90 tablet 3  . PARoxetine (PAXIL) 10 MG tablet Take 1 tablet (10 mg total) by mouth daily. 90 tablet 3  . Probiotic Product (PROBIOTIC DAILY PO) Take by mouth daily.    Marland Kitchen torsemide (DEMADEX) 20 MG tablet Take 0.5 tablets (10 mg total) by mouth daily. 45 tablet 3  . zolpidem (AMBIEN) 5 MG tablet Take 1 tablet (5 mg total) by mouth at bedtime as needed. 90 tablet 0   No current facility-administered medications on file prior to visit.    Allergies  Allergen Reactions  . Aspirin   . Diazepam     REACTION: worsened bladder problems    Past Medical History  Diagnosis Date  . Anxiety   . Depression   . Hyperlipidemia   . Arthritis   . Osteoporosis   . GERD (gastroesophageal reflux disease)   . CAD (coronary artery disease)   .  Interstitial cystitis   . Hx of colonic polyps   . Atrial fibrillation   . Non Q wave myocardial infarction 06/27/11    [facility]    Past Surgical History  Procedure Laterality Date  . Cataract extraction  2005  . Coronary stent placement  7/12    RCA with drug eluting stent---[name]  . Coronary artery bypass graft  1/13    [facility]  . Pacemaker insertion  5/13    [facility]    Family History  Problem Relation Age of Onset  . Heart disease Mother     cad  . Hypertension Mother   . Heart disease Father     heart attack  . Heart disease Sister     half sister MI  . Heart disease Brother     cabg  . Heart disease Brother   . Cancer Brother     BLADDER  . Cancer Sister     BRAIN  . Cancer Sister     OVARIAN    History   Social History  . Marital Status: Widowed    Spouse Name: N/A  . Number of Children: 1  . Years of Education: N/A   Occupational History  . RETIRED (  [employer])    Social History Main Topics  . Smoking status: Former Research scientist (life sciences)  . Smokeless tobacco: Never Used  . Alcohol Use: No  . Drug Use: No  . Sexual Activity: Not on file   Other Topics Concern  . Not on file   Social History Narrative   No living will   Requests son,Chadwick, as her health care POA   Would accept resuscitation attempts--but no prolonged ventilation   No tube feeds if cognitively unaware   Review of Systems  No fever Appetite is okay     Objective:   Physical Exam  Constitutional: She appears well-developed. No distress.  Musculoskeletal:  No spine tenderness in thoracic or lumbar area Mild tenderness laterally ~L2 and right lower ribs 2 finger breaths between ribs and iliac crest          Assessment & Plan:

## 2015-02-27 NOTE — Telephone Encounter (Signed)
Will evaluate at OV 

## 2015-02-27 NOTE — Patient Instructions (Addendum)
Please increase the tylenol to 2 of the 325mg  tabs every 4-6 hours as needed. If the pain is severe--try the prescription hydrocodone (1/2 or 1 tab at a time) Please start the vitamin D 1000 units daily Try to eat calcium in food--- like yogurt.

## 2015-02-27 NOTE — Telephone Encounter (Signed)
Pt has appt 02/27/15 at 12:15 PM with Dr [name].

## 2015-02-27 NOTE — Assessment & Plan Note (Addendum)
Has had past vertebral fractures but exam doesn't suggest an acute fracture at this point Discussed increasing the acetaminophen norco for prn use

## 2015-02-27 NOTE — Progress Notes (Signed)
Pre visit review using our clinic review tool, if applicable. No additional management support is needed unless otherwise documented below in the visit note. 

## 2015-03-26 ENCOUNTER — Other Ambulatory Visit: Payer: Self-pay | Admitting: Internal Medicine

## 2015-03-26 NOTE — Telephone Encounter (Signed)
Pt called for status of ambien refill; advised was called to [facility] [address] today at 2:04 pm  Pt voiced understanding and will ck with pharmacy.

## 2015-03-26 NOTE — Telephone Encounter (Signed)
Approved: okay #[id number] x 0 

## 2015-03-26 NOTE — Telephone Encounter (Signed)
12/26/14 

## 2015-03-26 NOTE — Telephone Encounter (Signed)
rx called into pharmacy

## 2015-06-04 ENCOUNTER — Other Ambulatory Visit: Payer: Self-pay | Admitting: Internal Medicine

## 2015-06-22 ENCOUNTER — Ambulatory Visit (INDEPENDENT_AMBULATORY_CARE_PROVIDER_SITE_OTHER): Payer: PPO | Admitting: Internal Medicine

## 2015-06-22 ENCOUNTER — Other Ambulatory Visit: Payer: Self-pay | Admitting: Internal Medicine

## 2015-06-22 ENCOUNTER — Encounter: Payer: Self-pay | Admitting: Internal Medicine

## 2015-06-22 VITALS — BP 152/82 | HR 60 | Temp 97.5°F | Ht <= 58 in | Wt 101.8 lb

## 2015-06-22 DIAGNOSIS — K219 Gastro-esophageal reflux disease without esophagitis: Secondary | ICD-10-CM

## 2015-06-22 DIAGNOSIS — Z7189 Other specified counseling: Secondary | ICD-10-CM

## 2015-06-22 DIAGNOSIS — F39 Unspecified mood [affective] disorder: Secondary | ICD-10-CM

## 2015-06-22 DIAGNOSIS — E785 Hyperlipidemia, unspecified: Secondary | ICD-10-CM

## 2015-06-22 DIAGNOSIS — I4891 Unspecified atrial fibrillation: Secondary | ICD-10-CM | POA: Diagnosis not present

## 2015-06-22 DIAGNOSIS — I25119 Atherosclerotic heart disease of native coronary artery with unspecified angina pectoris: Secondary | ICD-10-CM | POA: Diagnosis not present

## 2015-06-22 DIAGNOSIS — Z0000 Encounter for general adult medical examination without abnormal findings: Secondary | ICD-10-CM | POA: Diagnosis not present

## 2015-06-22 LAB — CBC WITH DIFFERENTIAL/PLATELET
BASOS ABS: 0 10*3/uL (ref 0.0–0.1)
BASOS PCT: 0.5 % (ref 0.0–3.0)
Eosinophils Absolute: 0.1 10*3/uL (ref 0.0–0.7)
Eosinophils Relative: 1.8 % (ref 0.0–5.0)
HCT: 40.7 % (ref 36.0–46.0)
Hemoglobin: 13.6 g/dL (ref 12.0–15.0)
LYMPHS ABS: 1.2 10*3/uL (ref 0.7–4.0)
LYMPHS PCT: 21.6 % (ref 12.0–46.0)
MCHC: 33.6 g/dL (ref 30.0–36.0)
MCV: 94.9 fl (ref 78.0–100.0)
MONOS PCT: 16.3 % — AB (ref 3.0–12.0)
Monocytes Absolute: 0.9 10*3/uL (ref 0.1–1.0)
Neutro Abs: 3.3 10*3/uL (ref 1.4–7.7)
Neutrophils Relative %: 59.8 % (ref 43.0–77.0)
PLATELETS: 224 10*3/uL (ref 150.0–400.0)
RBC: 4.28 Mil/uL (ref 3.87–5.11)
RDW: 13.8 % (ref 11.5–15.5)
WBC: 5.4 10*3/uL (ref 4.0–10.5)

## 2015-06-22 LAB — COMPREHENSIVE METABOLIC PANEL
ALT: 14 U/L (ref 0–35)
AST: 18 U/L (ref 0–37)
Albumin: 4.2 g/dL (ref 3.5–5.2)
Alkaline Phosphatase: 121 U/L — ABNORMAL HIGH (ref 39–117)
BILIRUBIN TOTAL: 0.7 mg/dL (ref 0.2–1.2)
BUN: 24 mg/dL — ABNORMAL HIGH (ref 6–23)
CHLORIDE: 102 meq/L (ref 96–112)
CO2: 29 meq/L (ref 19–32)
Calcium: 10 mg/dL (ref 8.4–10.5)
Creatinine, Ser: 0.91 mg/dL (ref 0.40–1.20)
GFR: 62.12 mL/min (ref >60.00)
GLUCOSE: 85 mg/dL (ref 70–99)
Potassium: 4.9 mEq/L (ref 3.5–5.1)
Sodium: 138 mEq/L (ref 135–145)
Total Protein: 7 g/dL (ref 6.0–8.3)

## 2015-06-22 LAB — LIPID PANEL
Cholesterol: 234 mg/dL — ABNORMAL HIGH (ref 0–200)
HDL: 62 mg/dL (ref >39.00)
LDL Cholesterol: 150 mg/dL — ABNORMAL HIGH (ref 0–99)
NONHDL: 171.64
Total CHOL/HDL Ratio: 4
Triglycerides: 110 mg/dL (ref 0.0–149.0)
VLDL: 22 mg/dL (ref 0.0–40.0)

## 2015-06-22 LAB — T4, FREE: Free T4: 0.91 ng/dL (ref 0.60–1.60)

## 2015-06-22 NOTE — Assessment & Plan Note (Signed)
Controlled with PPI

## 2015-06-22 NOTE — Assessment & Plan Note (Signed)
See social history 

## 2015-06-22 NOTE — Assessment & Plan Note (Signed)
Paced On apixaban

## 2015-06-22 NOTE — Assessment & Plan Note (Signed)
Intolerant of multiple statins 

## 2015-06-22 NOTE — Progress Notes (Signed)
Pre visit review using our clinic review tool, if applicable. No additional management support is needed unless otherwise documented below in the visit note. 

## 2015-06-22 NOTE — Assessment & Plan Note (Signed)
I have personally reviewed the Medicare Annual Wellness questionnaire and have noted 1. The patient's medical and social history 2. Their use of alcohol, tobacco or illicit drugs 3. Their current medications and supplements 4. The patient's functional ability including ADL's, fall risks, home safety risks and hearing or visual             impairment. 5. Diet and physical activities 6. Evidence for depression or mood disorders  The patients weight, height, BMI and visual acuity have been recorded in the chart I have made referrals, counseling and provided education to the patient based review of the above and I have provided the pt with a written personalized care plan for preventive services.  I have provided you with a copy of your personalized plan for preventive services. Please take the time to review along with your updated medication list.  No cancer screening due to age Prefers no flu vaccine  UTD otherwise (no zostavax)

## 2015-06-22 NOTE — Assessment & Plan Note (Signed)
Mostly chronic anxiety Controlled with the med--will continue

## 2015-06-22 NOTE — Progress Notes (Signed)
Subjective:    Patient ID: Joan Hull, female    DOB: Jun 07, 1926, 81 y.o.   MRN: [id number]  HPI Here for Medicare wellness and follow up of chronic medical conditions Reviewed form and advanced directives Only other doctor is cardiologist, Dr Ubaldo Glassing. Eye doctor--left cataract ([place]). Dentist left--Byerly is periodontist No tobacco or alcohol Tries to walk regularly Vision is fairly good. Mild hearing loss. Has constant ringing Independent with instrumental ADLs No falls No significant cognitive problems  Has noticed some shortness of breath Goes back for some time--7 -8 months Did review with Dr Ubaldo Glassing ---apparently EKG not changed This is exertional -- better with rest No chest pain or tightness No cough or fever--not sick  BP up a little lately ?more salt in diet No headaches No dizziness and no syncope No palpitations  Mood is variable Mostly satisfied No depressed mood lately and no sig anhedonia Mild anxiety--basically controlled  Has been intolerant of multiple statins Will not try again  Reflux quiet on protonix No swallowing  Current Outpatient Prescriptions on File Prior to Visit  Medication Sig Dispense Refill  . apixaban (ELIQUIS) 2.5 MG TABS tablet Take 1 tablet (2.5 mg total) by mouth 2 (two) times daily. 180 tablet 3  . BIOTIN PO Take by mouth daily.    . Calcium Citrate-Vitamin D (CITRACAL + D PO) Take by mouth daily.    . cholecalciferol (VITAMIN D) 1000 UNITS tablet Take 1,000 Units by mouth daily.    . digoxin (LANOXIN) 0.25 MG tablet Take 0.25 mg by mouth daily.    Marland Kitchen diltiazem (CARDIZEM CD) 180 MG 24 hr capsule Take 180 mg by mouth daily.   5  . metoprolol (LOPRESSOR) 50 MG tablet Take 0.5 tablets (25 mg total) by mouth 2 (two) times daily. 45 tablet 3  . pantoprazole (PROTONIX) 40 MG tablet Take 1 tablet (40 mg total) by mouth daily. 90 tablet 3  . PARoxetine (PAXIL) 10 MG tablet Take 1 tablet (10 mg total) by mouth daily. 90 tablet 3    . Probiotic Product (PROBIOTIC DAILY PO) Take by mouth daily.    Marland Kitchen torsemide (DEMADEX) 20 MG tablet TAKE ONE-HALF TABLET BY MOUTH ONCE DAILY 45 tablet 0  . zolpidem (AMBIEN) 5 MG tablet TAKE ONE TABLET BY MOUTH AT BEDTIME AS NEEDED 90 tablet 0   No current facility-administered medications on file prior to visit.    Allergies  Allergen Reactions  . Aspirin   . Diazepam     REACTION: worsened bladder problems    Past Medical History  Diagnosis Date  . Anxiety   . Depression   . Hyperlipidemia   . Arthritis   . Osteoporosis   . GERD (gastroesophageal reflux disease)   . CAD (coronary artery disease)   . Interstitial cystitis   . Hx of colonic polyps   . Atrial fibrillation ([place])   . Non Q wave myocardial infarction [facility]) 06/27/11    [facility]    Past Surgical History  Procedure Laterality Date  . Cataract extraction  2005  . Coronary stent placement  7/12    RCA with drug eluting stent---[name]  . Coronary artery bypass graft  1/13    [facility]  . Pacemaker insertion  5/13    [facility]    Family History  Problem Relation Age of Onset  . Heart disease Mother     cad  . Hypertension Mother   . Heart disease Father     heart attack  .  Heart disease Sister     half sister MI  . Heart disease Brother     cabg  . Heart disease Brother   . Cancer Brother     BLADDER  . Cancer Sister     BRAIN  . Cancer Sister     OVARIAN    Social History   Social History  . Marital Status: Widowed    Spouse Name: N/A  . Number of Children: 1  . Years of Education: N/A   Occupational History  . RETIRED ([employer])    Social History Main Topics  . Smoking status: Former Research scientist (life sciences)  . Smokeless tobacco: Never Used  . Alcohol Use: No  . Drug Use: No  . Sexual Activity: Not on file   Other Topics Concern  . Not on file   Social History Narrative   No living will   Son [name] is her health care POA   Would accept resuscitation attempts--but no prolonged  ventilation   No tube feeds if cognitively unaware   Review of Systems Notices easy bruising still Notes some redness in legs--- no ulcers or open areas Some "warts" on legs---keratoses seen Some arthritis in hands Occasional arm muscle pain--especially in cold weather Some teeth sensitivity Sleeps okay with the ambien Appetite is fine Weight is stable Some teeth sensitivity Some abdominal bloating--better after fluid pill. Bowels are usually regular Voids fine--- some urge incontinence after torsemdie    Objective:   Physical Exam  Constitutional: She is oriented to person, place, and time. She appears well-developed and well-nourished. No distress.  HENT:  Mouth/Throat: Oropharynx is clear and moist. No oropharyngeal exudate.  Neck: Normal range of motion. Neck supple. No thyromegaly present.  Cardiovascular: Normal rate, regular rhythm and intact distal pulses.  Exam reveals no gallop.   Gr 1/6 systolic murmur towards apex  Pulmonary/Chest: Effort normal and breath sounds normal. No respiratory distress. She has no wheezes. She has no rales.  Abdominal: Soft. There is no tenderness.  Musculoskeletal: She exhibits no edema.  Ganglion left wrist  Lymphadenopathy:    She has no cervical adenopathy.  Neurological: She is alert and oriented to person, place, and time.  President-- "Obama, Bush, Reagan---- (then) Clinton" [phone] D-[id number] Recall 3/3  Skin:  Venous stasis changes on calves  Psychiatric: She has a normal mood and affect. Her behavior is normal.          Assessment & Plan:

## 2015-06-22 NOTE — Assessment & Plan Note (Signed)
DOE seems to be a stable anginal equivalent Stable and no change in months Would not change meds--- no nitro, should seek attention if changes No other med changes needed

## 2015-06-25 ENCOUNTER — Encounter: Payer: Self-pay | Admitting: *Deleted

## 2015-06-25 ENCOUNTER — Other Ambulatory Visit: Payer: Self-pay | Admitting: Internal Medicine

## 2015-06-25 NOTE — Telephone Encounter (Signed)
03/26/2015 

## 2015-06-25 NOTE — Telephone Encounter (Signed)
Called in already today

## 2015-06-25 NOTE — Telephone Encounter (Signed)
Approved: #90 x 0 

## 2015-06-25 NOTE — Telephone Encounter (Signed)
rx called into pharmacy

## 2015-06-25 NOTE — Telephone Encounter (Signed)
Pt left v/m requesting status of zolpidem refill. 

## 2015-07-05 ENCOUNTER — Other Ambulatory Visit: Payer: Self-pay | Admitting: Internal Medicine

## 2015-07-06 ENCOUNTER — Other Ambulatory Visit: Payer: Self-pay | Admitting: *Deleted

## 2015-07-06 MED ORDER — PAROXETINE HCL 10 MG PO TABS: 10.0000 mg | ORAL_TABLET | Freq: Every day | ORAL | Status: DC

## 2015-08-19 DIAGNOSIS — C801 Malignant (primary) neoplasm, unspecified: Secondary | ICD-10-CM

## 2015-08-19 HISTORY — DX: Malignant (primary) neoplasm, unspecified: C80.1

## 2015-09-03 ENCOUNTER — Other Ambulatory Visit: Payer: Self-pay | Admitting: Internal Medicine

## 2015-09-24 ENCOUNTER — Other Ambulatory Visit: Payer: Self-pay | Admitting: Internal Medicine

## 2015-09-24 NOTE — Telephone Encounter (Signed)
rx called into pharmacy

## 2015-09-24 NOTE — Telephone Encounter (Signed)
Approved: #90 x 0 

## 2015-09-24 NOTE — Telephone Encounter (Signed)
06/25/2015 

## 2015-09-24 NOTE — Telephone Encounter (Signed)
Pt left v/m requesting cb about status of zolpidem refill; pt wants to pick up med today and request cb today; left v/m for pt to ck with the pharmacy.

## 2015-12-03 ENCOUNTER — Other Ambulatory Visit: Payer: Self-pay | Admitting: Internal Medicine

## 2015-12-03 NOTE — Telephone Encounter (Signed)
Pt left v/m requesting status of Eliquis refill.

## 2015-12-03 NOTE — Telephone Encounter (Signed)
Spoke to patient. Advised her it will be taken care of later today.

## 2015-12-03 NOTE — Telephone Encounter (Signed)
Rx sent electronically.  

## 2015-12-20 ENCOUNTER — Other Ambulatory Visit: Payer: Self-pay | Admitting: Internal Medicine

## 2015-12-20 NOTE — Telephone Encounter (Signed)
Spoke to patient

## 2015-12-20 NOTE — Telephone Encounter (Signed)
Last filled 09-24-15 #90 Last OV 06-22-15 Next OV 06-23-16

## 2015-12-20 NOTE — Telephone Encounter (Signed)
Pt left v/m requesting cb with status of zolpidem refill.

## 2015-12-20 NOTE — Telephone Encounter (Signed)
Approved: #90 x 0 

## 2015-12-20 NOTE — Telephone Encounter (Signed)
Left refill on voice mail at pharmacy  

## 2016-01-01 ENCOUNTER — Telehealth: Payer: Self-pay | Admitting: Internal Medicine

## 2016-01-01 NOTE — Telephone Encounter (Signed)
Patient brought in a [place] [employer] Veh[employer] Consent/Info form to be filled out.  Form was put in CMS's incoming box.

## 2016-01-01 NOTE — Telephone Encounter (Signed)
Last office visit was for [facility] on 06-22-15. Not sure if she will need an OV for this form. Placed in Dr Alla German InBox on his desk

## 2016-01-02 NOTE — Telephone Encounter (Signed)
Faythe Ghee will do it tomorrow

## 2016-01-02 NOTE — Telephone Encounter (Signed)
Please let her know that I can do this form without a visit--it will cost $20. I don't have time today--hopefully I can get to it tomorrow

## 2016-01-02 NOTE — Telephone Encounter (Signed)
I notified patient and she said she'll come in for an office visit.  Patient scheduled appointment on 01/03/16.

## 2016-01-03 ENCOUNTER — Ambulatory Visit (INDEPENDENT_AMBULATORY_CARE_PROVIDER_SITE_OTHER): Payer: PPO | Admitting: Internal Medicine

## 2016-01-03 ENCOUNTER — Encounter: Payer: Self-pay | Admitting: General Surgery

## 2016-01-03 ENCOUNTER — Encounter: Payer: Self-pay | Admitting: Internal Medicine

## 2016-01-03 ENCOUNTER — Other Ambulatory Visit: Payer: Self-pay | Admitting: Internal Medicine

## 2016-01-03 VITALS — BP 126/70 | HR 60 | Temp 97.3°F | Wt 102.5 lb

## 2016-01-03 DIAGNOSIS — F39 Unspecified mood [affective] disorder: Secondary | ICD-10-CM

## 2016-01-03 DIAGNOSIS — L989 Disorder of the skin and subcutaneous tissue, unspecified: Secondary | ICD-10-CM | POA: Insufficient documentation

## 2016-01-03 DIAGNOSIS — I25119 Atherosclerotic heart disease of native coronary artery with unspecified angina pectoris: Secondary | ICD-10-CM | POA: Diagnosis not present

## 2016-01-03 DIAGNOSIS — R2232 Localized swelling, mass and lump, left upper limb: Secondary | ICD-10-CM

## 2016-01-03 NOTE — Progress Notes (Signed)
Pre visit review using our clinic review tool, if applicable. No additional management support is needed unless otherwise documented below in the visit note. 

## 2016-01-03 NOTE — Patient Instructions (Signed)
Please set up with Dr Nehemiah Massed to remove the left leg spot.

## 2016-01-03 NOTE — Assessment & Plan Note (Signed)
DOE seems to be anginal equivalent No recent changes  Keeps up with Dr Ubaldo Glassing

## 2016-01-03 NOTE — Assessment & Plan Note (Signed)
She has always been high energy and mild anxiety Doing fine on meds No restrictions for driving needed Forms done

## 2016-01-03 NOTE — Addendum Note (Signed)
Addended by: Tammi Sou on: 01/03/2016 04:01 PM   Modules accepted: Orders, Medications

## 2016-01-03 NOTE — Progress Notes (Signed)
Subjective:    Patient ID: Joan Hull, female    DOB: [birth year], 82 y.o.   MRN: [id number]  HPI Here for me to fill out DMV forms--but has other concerns  Has left leg lesion Just won't go away Picks at it--comes off. But then comes back  Also noticed lump in left axilla About 3 days ago Not really sore but she notices it No clear problems with the breast  Current Outpatient Prescriptions on File Prior to Visit  Medication Sig Dispense Refill  . BIOTIN PO Take by mouth daily.    . Calcium Citrate-Vitamin D (CITRACAL + D PO) Take by mouth daily.    . cholecalciferol (VITAMIN D) 1000 UNITS tablet Take 1,000 Units by mouth daily.    . digoxin (LANOXIN) 0.25 MG tablet Take 0.125 mg by mouth daily.     Marland Kitchen diltiazem (CARDIZEM CD) 180 MG 24 hr capsule Take 180 mg by mouth daily.   5  . ELIQUIS 2.5 MG TABS tablet TAKE ONE TABLET BY MOUTH TWICE DAILY 180 tablet 0  . metoprolol (LOPRESSOR) 50 MG tablet TAKE ONE-HALF TABLET BY MOUTH TWICE DAILY 90 tablet 3  . pantoprazole (PROTONIX) 40 MG tablet Take 1 tablet (40 mg total) by mouth daily. 90 tablet 3  . PARoxetine (PAXIL) 10 MG tablet Take 1 tablet (10 mg total) by mouth daily. 90 tablet 3  . Probiotic Product (PROBIOTIC DAILY PO) Take by mouth daily.    Marland Kitchen torsemide (DEMADEX) 20 MG tablet TAKE ONE-HALF TABLET BY MOUTH ONCE DAILY 45 tablet 0  . zolpidem (AMBIEN) 5 MG tablet TAKE ONE TABLET BY MOUTH AT BEDTIME AS NEEDED 90 tablet 0   No current facility-administered medications on file prior to visit.    Allergies  Allergen Reactions  . Aspirin   . Diazepam     REACTION: worsened bladder problems    Past Medical History  Diagnosis Date  . Anxiety   . Depression   . Hyperlipidemia   . Arthritis   . Osteoporosis   . GERD (gastroesophageal reflux disease)   . CAD (coronary artery disease)   . Interstitial cystitis   . Hx of colonic polyps   . Atrial fibrillation ([place])   . Non Q wave myocardial infarction [facility]) 06/27/11      [facility]    Past Surgical History  Procedure Laterality Date  . Cataract extraction  2005  . Coronary stent placement  7/12    RCA with drug eluting stent---[name]  . Coronary artery bypass graft  1/13    [facility]  . Pacemaker insertion  5/13    [facility]    Family History  Problem Relation Age of Onset  . Heart disease Mother     cad  . Hypertension Mother   . Heart disease Father     heart attack  . Heart disease Sister     half sister MI  . Heart disease Brother     cabg  . Heart disease Brother   . Cancer Brother     BLADDER  . Cancer Sister     BRAIN  . Cancer Sister     OVARIAN    Social History   Social History  . Marital Status: Widowed    Spouse Name: N/A  . Number of Children: 1  . Years of Education: N/A   Occupational History  . RETIRED ([employer])    Social History Main Topics  . Smoking status: Former Research scientist (life sciences)  .  Smokeless tobacco: Never Used  . Alcohol Use: No  . Drug Use: No  . Sexual Activity: Not on file   Other Topics Concern  . Not on file   Social History Narrative   No living will   Son [name] is her health care POA   Would accept resuscitation attempts--but no prolonged ventilation   No tube feeds if cognitively unaware   Review of Systems Gets SOB with activity--stops and feels better right away Has been stable Occasional palpitations Going back to Dr Ubaldo Glassing in a few weeks Has been back on atenolol--this helped the palpitations Not strikingly different  No chest pain    Objective:   Physical Exam  Neck: Normal range of motion. No thyromegaly present.  Cardiovascular: Normal rate, regular rhythm and normal heart sounds.  Exam reveals no gallop.   No murmur heard. Pulmonary/Chest: Effort normal and breath sounds normal. No respiratory distress. She has no wheezes. She has no rales.  Genitourinary:  Several firm lesions in left breast--none on right  2-3cm non tender mass in left axilla--near pectoral muscle  but still may be node  Lymphadenopathy:    She has no cervical adenopathy.  Skin:  ~7-37mm neoplastic growth on left calf          Assessment & Plan:

## 2016-01-03 NOTE — Assessment & Plan Note (Signed)
Looks neoplastic  Asked her to set up with Dr Nehemiah Massed

## 2016-01-03 NOTE — Assessment & Plan Note (Signed)
Could be cyst or node Left breast exam not reassuring ---several non specific masses Will set up with surgeon

## 2016-01-04 NOTE — Telephone Encounter (Signed)
Rx sent electronically.  

## 2016-01-15 ENCOUNTER — Ambulatory Visit: Payer: Self-pay

## 2016-01-15 ENCOUNTER — Ambulatory Visit (INDEPENDENT_AMBULATORY_CARE_PROVIDER_SITE_OTHER): Payer: PPO | Admitting: General Surgery

## 2016-01-15 ENCOUNTER — Encounter: Payer: Self-pay | Admitting: General Surgery

## 2016-01-15 VITALS — BP 130/70 | HR 62 | Resp 12 | Ht <= 58 in | Wt 103.0 lb

## 2016-01-15 DIAGNOSIS — R2232 Localized swelling, mass and lump, left upper limb: Secondary | ICD-10-CM | POA: Diagnosis not present

## 2016-01-15 DIAGNOSIS — I898 Other specified noninfective disorders of lymphatic vessels and lymph nodes: Secondary | ICD-10-CM | POA: Diagnosis not present

## 2016-01-15 NOTE — Patient Instructions (Addendum)
The patient is aware to call back for any questions or concerns.    CARE AFTER BREAST or axillary BIOPSY  1. Leave the dressing on that your doctor applied after the biopsy. It is waterproof. You may bathe, shower and/or swim. The dressing can be removed in 3 days, you will see small strips of tape against your skin on the incision. Do not remove these strips they will gradually fall off in about 2-3 weeks. You may use an ice pack on and off for the first 12-24 hours for comfort.  2. You may want to use a gauze,cloth or similar protection in your bra to prevent rubbing against your dressing and incision. This is not necessary, but you may feel more comfortable doing so.  3. It is recommended that you wear a bra day and night to give support to the breast. This will prevent the weight of the breast from pulling on the incision.  4. Your breast may feel hard and lumpy under the incision. Do not be alarmed. This is the underlying stitching of tissue. Softening of this tissue will occur in time.  5. You may have a follow up appointment or phone follow up in one week after your biopsy. The office phone number is [phone].  6. You will notice about a week or two after your office visit that the strips of the tape on your incision will begin to loosen. These may then be removed.  7. Report to your doctor any of the following:  * Severe pain not relieved by your pain medication  *Redness of the incision  * Drainage from the incision  *Fever greater than 101 degrees

## 2016-01-15 NOTE — Progress Notes (Signed)
Patient ID: Joan Hull, female   DOB: [birth year], 82 y.o.   MRN: [id number]  Chief Complaint  Patient presents with  . Mass    left axilla    HPI Joan Hull is a 82 y.o. female.  Here today for evaluation of a lower left axillary lump. She noticed a lump about 2 weeks ago, she states it was sore and firm. She states it was about "pecan shell" size. She states the area is gone now. She states she noticed it after trying to do a floor exerciseAs being demonstrated on TV. She does admit to bloating.  She has noted some night sweats, not so severe as to require change in her night garments. She is retired from [employer]. Last mammogram was 2010.(The patient had been told she did she had aged out of routine mammograms. Sister Joan Hull  HPI  Past Medical History  Diagnosis Date  . Anxiety   . Depression   . Hyperlipidemia   . Arthritis   . Osteoporosis   . GERD (gastroesophageal reflux disease)   . CAD (coronary artery disease)   . Interstitial cystitis   . Hx of colonic polyps   . Atrial fibrillation ([place])   . Non Q wave myocardial infarction [name]) 06/27/11    [facility]    Past Surgical History  Procedure Laterality Date  . Cataract extraction  2005  . Coronary stent placement  7/12    RCA with drug eluting stent---[name]  . Coronary artery bypass graft  1/13    [facility]  . Pacemaker insertion  5/13    [facility]  . Colonoscopy  2007    [name]    Family History  Problem Relation Age of Onset  . Heart disease Mother     cad  . Hypertension Mother   . Heart disease Father     heart attack  . Heart disease Sister     half sister MI  . Heart disease Brother     cabg  . Heart disease Brother   . Cancer Brother     BLADDER  . Cancer Sister     BRAIN  . Cancer Sister 56    OVARIAN    Social History Social History  Substance Use Topics  . Smoking status: Former Smoker    Quit date: 08/18/1962  . Smokeless tobacco: Never Used  .  Alcohol Use: No    Allergies  Allergen Reactions  . Aspirin Other (See Comments)    Bladder spasm  . Diazepam     REACTION: worsened bladder problems    Current Outpatient Prescriptions  Medication Sig Dispense Refill  . atenolol (TENORMIN) 25 MG tablet Take 1 tablet by mouth daily.    Marland Kitchen BIOTIN PO Take by mouth daily.    Marland Kitchen diltiazem (CARDIZEM CD) 180 MG 24 hr capsule Take 180 mg by mouth daily.   5  . ELIQUIS 2.5 MG TABS tablet TAKE ONE TABLET BY MOUTH TWICE DAILY 180 tablet 0  . famotidine (PEPCID) 10 MG tablet Take 10 mg by mouth daily.    . metoprolol (LOPRESSOR) 50 MG tablet TAKE ONE-HALF TABLET BY MOUTH TWICE DAILY 90 tablet 3  . PARoxetine (PAXIL) 10 MG tablet Take 1 tablet (10 mg total) by mouth daily. 90 tablet 3  . Probiotic Product (PROBIOTIC DAILY PO) Take by mouth daily.    Marland Kitchen torsemide (DEMADEX) 20 MG tablet TAKE ONE-HALF TABLET BY MOUTH ONCE DAILY 45 tablet 0  . zolpidem (  AMBIEN) 5 MG tablet TAKE ONE TABLET BY MOUTH AT BEDTIME AS NEEDED 90 tablet 0   No current facility-administered medications for this visit.    Review of Systems Review of Systems  Constitutional: Negative.   Respiratory: Positive for shortness of breath.   Cardiovascular: Negative.     Blood pressure 130/70, pulse 62, resp. rate 12, height 4\' 10"  (1.473 m), weight 103 lb (46.72 kg).  Physical Exam Physical Exam  Constitutional: She is oriented to person, place, and time. She appears well-developed and well-nourished.  HENT:  Mouth/Throat: Oropharynx is clear and moist.  Eyes: Conjunctivae are normal. No scleral icterus.  Neck: Neck supple.  Cardiovascular: Normal rate, regular rhythm and normal heart sounds.   Pulses:      Femoral pulses are 2+ on the right side, and 2+ on the left side. Pulmonary/Chest: Effort normal and breath sounds normal. Right breast exhibits no inverted nipple, no mass, no nipple discharge, no skin change and no tenderness. Left breast exhibits no inverted nipple,  no mass, no nipple discharge, no skin change and no tenderness.    Thickening 3 o'clock  4 CFN left breast. Left axillary 3 cm nodule.  Abdominal: Soft. Normal appearance. There is splenomegaly (questionably palpable spleen on deep inspiration.). There is no hepatomegaly. There is no tenderness.  Lymphadenopathy:       Head (right side): No posterior auricular adenopathy present.       Head (left side): No posterior auricular adenopathy present.    She has no cervical adenopathy.    She has axillary adenopathy (left axilla, multiple enlarged nodes.).       Right: No inguinal and no supraclavicular adenopathy present.       Left: No inguinal and no supraclavicular adenopathy present.  Neurological: She is alert and oriented to person, place, and time.  Skin: Skin is warm and dry.  8 mm lesion left anterior calf with scab that was removed and silver nitrate used to the area.  Psychiatric: Her behavior is normal.    Data Reviewed CBC dated 06/22/2015 was normal.   Ultrasound examination of the left breast was undertaken. No obvious cystic or solid lesions within the breast parenchyma. The left axilla showed several large hypoechoic lesions measuring up to 2.5 cm in diameter. Consistent with massively enlarged lymph nodes. Posterior acoustic enhancement noted.  Patient was amenable to core biopsy. In spite of her present therapy with Eliquis it was elected to proceed to obtain a tissue diagnosis. 10 mL of 0.5% Xylocaine with 0.25% Marcaine with 1-200,000 of epinephrine was utilized well tolerated. A 14-gauge Bard biopsy device was utilized and 3 core biopsies were obtained. These were sent in formalin for histology. Scant bleeding noted. Skin defect closed with benzoin and Steri-Strips followed by Telfa and Tegaderm dressing.  The procedure was well-tolerated and postoperative wound care reviewed.  Assessment    Enlarged left axillary nodes.    Plan    The patient will be contacted when  biopsy results are available.       PCP/Ref:  [name] This information has been scribed by [name], BSN,BC.   Joan Hull 01/16/2016, 8:44 PM

## 2016-01-16 DIAGNOSIS — R2230 Localized swelling, mass and lump, unspecified upper limb: Secondary | ICD-10-CM | POA: Insufficient documentation

## 2016-01-18 ENCOUNTER — Other Ambulatory Visit: Payer: Self-pay

## 2016-01-18 ENCOUNTER — Telehealth: Payer: Self-pay

## 2016-01-18 DIAGNOSIS — C8594 Non-Hodgkin lymphoma, unspecified, lymph nodes of axilla and upper limb: Secondary | ICD-10-CM

## 2016-01-18 DIAGNOSIS — R2232 Localized swelling, mass and lump, left upper limb: Secondary | ICD-10-CM

## 2016-01-18 NOTE — Telephone Encounter (Signed)
Notified patient as instructed, patient pleased. Discussed follow-up appointment with Dr Mike Gip. Patient to have lab work done on Monday June 5th.

## 2016-01-18 NOTE — Telephone Encounter (Signed)
-----   Message from [name], MD sent at 01/18/2016 10:07 AM EDT ----- Please notify the patient she has an appointment with Nolon Stalls, MD at the [facility] (first visit), Wednesday June7 at 3: 15 PM.She will only need to go to [facility] this one time.   She needs to have a CBC, Comprehensive Metabolic panel, LDH and Hepatitis panel drawn before that appointment.   Thanks

## 2016-01-21 DIAGNOSIS — C8594 Non-Hodgkin lymphoma, unspecified, lymph nodes of axilla and upper limb: Secondary | ICD-10-CM | POA: Diagnosis not present

## 2016-01-21 DIAGNOSIS — R2232 Localized swelling, mass and lump, left upper limb: Secondary | ICD-10-CM | POA: Diagnosis not present

## 2016-01-22 LAB — COMPREHENSIVE METABOLIC PANEL
A/G RATIO: 1.8 (ref 1.2–2.2)
ALBUMIN: 4.4 g/dL (ref 3.5–4.7)
ALT: 12 IU/L (ref 0–32)
AST: 15 IU/L (ref 0–40)
Alkaline Phosphatase: 124 IU/L — ABNORMAL HIGH (ref 39–117)
BILIRUBIN TOTAL: 0.5 mg/dL (ref 0.0–1.2)
BUN/Creatinine Ratio: 18 (ref 12–28)
BUN: 20 mg/dL (ref 8–27)
CHLORIDE: 97 mmol/L (ref 96–106)
CO2: 24 mmol/L (ref 18–29)
Calcium: 10 mg/dL (ref 8.7–10.3)
Creatinine, Ser: 1.13 mg/dL — ABNORMAL HIGH (ref 0.57–1.00)
GFR calc non Af Amer: 44 mL/min/{1.73_m2} — ABNORMAL LOW (ref >59)
GFR, EST AFRICAN AMERICAN: 50 mL/min/{1.73_m2} — AB (ref >59)
Globulin, Total: 2.5 g/dL (ref 1.5–4.5)
Glucose: 176 mg/dL — ABNORMAL HIGH (ref 65–99)
POTASSIUM: 5.2 mmol/L (ref 3.5–5.2)
Sodium: 143 mmol/L (ref 134–144)
TOTAL PROTEIN: 6.9 g/dL (ref 6.0–8.5)

## 2016-01-22 LAB — CBC WITH DIFFERENTIAL/PLATELET
BASOS: 1 %
Basophils Absolute: 0 10*3/uL (ref 0.0–0.2)
EOS (ABSOLUTE): 0.3 10*3/uL (ref 0.0–0.4)
EOS: 5 %
HEMATOCRIT: 36.5 % (ref 34.0–46.6)
HEMOGLOBIN: 12.4 g/dL (ref 11.1–15.9)
IMMATURE GRANS (ABS): 0 10*3/uL (ref 0.0–0.1)
Immature Granulocytes: 0 %
LYMPHS: 22 %
Lymphocytes Absolute: 1.4 10*3/uL (ref 0.7–3.1)
MCH: 31.2 pg (ref 26.6–33.0)
MCHC: 34 g/dL (ref 31.5–35.7)
MCV: 92 fL (ref 79–97)
MONOCYTES: 15 %
Monocytes Absolute: 1 10*3/uL — ABNORMAL HIGH (ref 0.1–0.9)
NEUTROS ABS: 3.8 10*3/uL (ref 1.4–7.0)
Neutrophils: 57 %
Platelets: 259 10*3/uL (ref 150–379)
RBC: 3.98 x10E6/uL (ref 3.77–5.28)
RDW: 13.7 % (ref 12.3–15.4)
WBC: 6.6 10*3/uL (ref 3.4–10.8)

## 2016-01-22 LAB — HEPATITIS PANEL, ACUTE
HEP A IGM: NEGATIVE
HEP B C IGM: NEGATIVE
Hepatitis B Surface Ag: NEGATIVE

## 2016-01-22 LAB — LACTATE DEHYDROGENASE: LDH: 207 IU/L (ref 119–226)

## 2016-01-23 ENCOUNTER — Inpatient Hospital Stay: Payer: PPO | Attending: Hematology and Oncology | Admitting: Hematology and Oncology

## 2016-01-23 ENCOUNTER — Telehealth: Payer: Self-pay | Admitting: *Deleted

## 2016-01-23 ENCOUNTER — Encounter: Payer: Self-pay | Admitting: Hematology and Oncology

## 2016-01-23 VITALS — BP 151/69 | HR 61 | Temp 97.9°F | Resp 18 | Ht <= 58 in | Wt 102.7 lb

## 2016-01-23 DIAGNOSIS — M67442 Ganglion, left hand: Secondary | ICD-10-CM | POA: Diagnosis not present

## 2016-01-23 DIAGNOSIS — R140 Abdominal distension (gaseous): Secondary | ICD-10-CM | POA: Diagnosis not present

## 2016-01-23 DIAGNOSIS — R590 Localized enlarged lymph nodes: Secondary | ICD-10-CM | POA: Insufficient documentation

## 2016-01-23 DIAGNOSIS — N6459 Other signs and symptoms in breast: Secondary | ICD-10-CM | POA: Diagnosis not present

## 2016-01-23 DIAGNOSIS — Z79899 Other long term (current) drug therapy: Secondary | ICD-10-CM | POA: Diagnosis not present

## 2016-01-23 DIAGNOSIS — Z95818 Presence of other cardiac implants and grafts: Secondary | ICD-10-CM | POA: Diagnosis not present

## 2016-01-23 DIAGNOSIS — C8594 Non-Hodgkin lymphoma, unspecified, lymph nodes of axilla and upper limb: Secondary | ICD-10-CM | POA: Diagnosis not present

## 2016-01-23 DIAGNOSIS — Z87891 Personal history of nicotine dependence: Secondary | ICD-10-CM | POA: Insufficient documentation

## 2016-01-23 DIAGNOSIS — M1990 Unspecified osteoarthritis, unspecified site: Secondary | ICD-10-CM

## 2016-01-23 DIAGNOSIS — I252 Old myocardial infarction: Secondary | ICD-10-CM | POA: Diagnosis not present

## 2016-01-23 DIAGNOSIS — Z951 Presence of aortocoronary bypass graft: Secondary | ICD-10-CM | POA: Diagnosis not present

## 2016-01-23 DIAGNOSIS — R61 Generalized hyperhidrosis: Secondary | ICD-10-CM | POA: Diagnosis not present

## 2016-01-23 DIAGNOSIS — Z8041 Family history of malignant neoplasm of ovary: Secondary | ICD-10-CM | POA: Insufficient documentation

## 2016-01-23 DIAGNOSIS — K219 Gastro-esophageal reflux disease without esophagitis: Secondary | ICD-10-CM

## 2016-01-23 DIAGNOSIS — Z7901 Long term (current) use of anticoagulants: Secondary | ICD-10-CM | POA: Diagnosis not present

## 2016-01-23 DIAGNOSIS — Z8052 Family history of malignant neoplasm of bladder: Secondary | ICD-10-CM | POA: Diagnosis not present

## 2016-01-23 DIAGNOSIS — I4891 Unspecified atrial fibrillation: Secondary | ICD-10-CM

## 2016-01-23 DIAGNOSIS — I2510 Atherosclerotic heart disease of native coronary artery without angina pectoris: Secondary | ICD-10-CM | POA: Insufficient documentation

## 2016-01-23 DIAGNOSIS — E785 Hyperlipidemia, unspecified: Secondary | ICD-10-CM | POA: Diagnosis not present

## 2016-01-23 NOTE — Telephone Encounter (Signed)
-----   Message from [name], MD sent at 01/23/2016  7:15 AM EDT ----- Notify labs OK.  She is to meet with Dr. Mike Gip today to be evaluated for lymphoma. ----- Message -----    From: [facility] Lab Results In Interface    Sent: 01/22/2016   7:38 AM      To: [name], MD

## 2016-01-23 NOTE — Progress Notes (Signed)
[facility] day:  01/23/2016  Chief Complaint: Joan Hull is a 82 y.o. female with lymphoma who is referred in consultation by Dr. Hervey Ard for assessment and management.   HPI:  The patient noted a lump under her left arm about 2-3 days before seeking medical attention.  She denied any other adenopathy.  She notes rare night sweats for the past month.  She also notes some bloating for which she has taken a fluid pill for the past 4 years.  She states that she doesn't eat much secondary to bloating.  Her weight has been stable.  She notes "lots of shortness with breath"  with exertion for which she is following up with cardiology tomorrow.  She was seen by Dr Bary Castilla on 01/15/2016.  Exam revealed a 3 cm left axillary node and thickening of the breast at the 3 o'clock position.  Ultrasound of the left breast revealed no obvious cystic or solid lesions within the breast parenchyma. The left axilla showed several large hypoechoic lesions measuring up to 2.5 cm in diameter with posterior acoustic enhancement.  She underwent core biopsy.  Pathology revealed atypical lymphoid proliferation highly suspicious for lymphoma.  There was effacement of the architecture by a predominantly diffuse lymphoid proliferation.  The lymphoid cells were diffusely positive for LCA and BCL2 and show a mixture of T and B cells but with an apparently significant B-cell component (CD20 and CD79a +; CD5-) characterized by diffuse sheets in some areas.  CD138 highlighted the relatively abundant plasmacytoid cells, which appear to show lambda light chain restriction. The lambda staining was apparently present in lymphoid cells in addition. The overall features are highly suspicious for non-Hodgkin's lymphoma, particularly B-cell.  Labs on 01/21/2016 revealed the following normal studies:  CBC with diff, hepatitis B surface antigen, hepatitis B core IgM, hepatitis C antibody,  and LDH was 207.  CMP revealed a creatinine of 1.13 (CrCl 44 ml/min).  Alkaline phosphatase was 124.   Past Medical History  Diagnosis Date  . Anxiety   . Depression   . Hyperlipidemia   . Arthritis   . GERD (gastroesophageal reflux disease)   . CAD (coronary artery disease)   . Interstitial cystitis   . Hx of colonic polyps   . Atrial fibrillation ([place])   . Non Q wave myocardial infarction ([place]) 06/27/11    [facility]  . Osteoporosis     Past Surgical History  Procedure Laterality Date  . Cataract extraction  2005  . Coronary stent placement  7/12    RCA with drug eluting stent---[name]  . Coronary artery bypass graft  1/13    [facility]  . Pacemaker insertion  5/13    [facility]  . Colonoscopy  2007    [name]    Family History  Problem Relation Age of Onset  . Heart disease Mother     cad  . Hypertension Mother   . Heart disease Father     heart attack  . Heart disease Sister     half sister MI  . Heart disease Brother     cabg  . Heart disease Brother   . Cancer Brother     BLADDER  . Cancer Sister     BRAIN  . Cancer Sister 60    OVARIAN    Social History:  reports that she quit smoking about 53 years ago. She has never used smokeless tobacco. She reports that she does not drink  alcohol or use illicit drugs.  She smoked when she was young (quit in 1964).  She lives alone in [place].  Her son lives in [place], [place]. The patient is alone today.  Allergies:  Allergies  Allergen Reactions  . Aspirin Other (See Comments)    Bladder spasm  . Diazepam     REACTION: worsened bladder problems    Current Medications: Current Outpatient Prescriptions  Medication Sig Dispense Refill  . atenolol (TENORMIN) 25 MG tablet Take 1 tablet by mouth daily.    Marland Kitchen BIOTIN PO Take by mouth daily.    Marland Kitchen diltiazem (CARDIZEM CD) 180 MG 24 hr capsule Take 180 mg by mouth daily.   5  . ELIQUIS 2.5 MG TABS tablet TAKE ONE TABLET BY MOUTH TWICE DAILY 180 tablet 0  . famotidine  (PEPCID) 10 MG tablet Take 10 mg by mouth daily.    . metoprolol (LOPRESSOR) 50 MG tablet TAKE ONE-HALF TABLET BY MOUTH TWICE DAILY 90 tablet 3  . Multiple Vitamins-Minerals (CENTRUM SILVER 50+WOMEN PO) Take 1 tablet by mouth.    Marland Kitchen PARoxetine (PAXIL) 10 MG tablet Take 1 tablet (10 mg total) by mouth daily. (Patient taking differently: Take 10 mg by mouth daily. 1/2 tablet) 90 tablet 3  . Probiotic Product (PROBIOTIC DAILY PO) Take by mouth daily.    Marland Kitchen torsemide (DEMADEX) 20 MG tablet TAKE ONE-HALF TABLET BY MOUTH ONCE DAILY 45 tablet 0  . zolpidem (AMBIEN) 5 MG tablet TAKE ONE TABLET BY MOUTH AT BEDTIME AS NEEDED 90 tablet 0   No current facility-administered medications for this visit.    Review of Systems:  GENERAL:  Feels good. No fevers. Some night sweats x 1 month.  No weight loss. PERFORMANCE STATUS (ECOG):  1 HEENT:  No visual changes, runny nose, sore throat, mouth sores or tenderness. Lungs: Shortness of breath with exertion.  No cough.  No hemoptysis. Cardiac:  Atrial fibrillation.  No chest pain, palpitations, orthopnea, or PND.  Sees cardiologist tomorrow. GI:  Bloating.  No nausea, vomiting, diarrhea, constipation, melena or hematochezia. GU:  No urgency, frequency, dysuria, or hematuria. Musculoskeletal:  No back pain.  No joint pain.  No muscle tenderness. Extremities:  Hands stiff secondary to arthritis.  No pain or swelling. Skin:  No rashes or skin changes. Neuro:  No headache, numbness or weakness, balance or coordination issues. Endocrine:  No diabetes, thyroid issues, hot flashes or night sweats. Psych:  No mood changes, depression or anxiety. Pain:  No focal pain. Review of systems:  All other systems reviewed and found to be negative.  Physical Exam: Blood pressure 151/69, pulse 61, temperature 97.9 F (36.6 C), temperature source Tympanic, resp. rate 18, height 4\' 10"  (1.473 m), weight 102 lb 11.8 oz (46.6 kg). GENERAL:  Well developed, well nourished, sitting  comfortably in the exam room in no acute distress. MENTAL STATUS:  Alert and oriented to person, place and time. HEAD:  Dark brown styled hair.  Normocephalic, atraumatic, face symmetric, no Cushingoid features. EYES:  Glasses.  Blue eyes s/p cataract surgery on the right.  Pupils equal round and reactive to light and accomodation.  No conjunctivitis or scleral icterus. ENT:  Oropharynx clear without lesion.  Tongue normal. Mucous membranes moist.  RESPIRATORY:  Clear to auscultation without rales, wheezes or rhonchi. CARDIOVASCULAR:  Irregular rhythm without murmur, rub or gallop. CHEST:  Well healed midline sternotomy incision.  Pacemaker. ABDOMEN:  Soft, non-tender, with active bowel sounds, and no hepatosplenomegaly.  No masses. SKIN:  No  rashes, ulcers or lesions. EXTREMITIES: Arthritis changes in hands.  Left ganglion cyst.  No edema, no skin discoloration or tenderness.  No palpable cords. LYMPH NODES: Tiny right axillary node.  Left axillary node 2 x 4 cm.  No palpable cervical, supraclavicular, or inguinal adenopathy  NEUROLOGICAL: Unremarkable. PSYCH:  Appropriate.  No visits with results within 3 Day(s) from this visit. Latest known visit with results is:  Orders Only on 01/18/2016  Component Date Value Ref Range Status  . LDH 01/21/2016 207  119 - 226 IU/L Final  . WBC 01/21/2016 6.6  3.4 - 10.8 x10E3/uL Final  . RBC 01/21/2016 3.98  3.77 - 5.28 x10E6/uL Final  . Hemoglobin 01/21/2016 12.4  11.1 - 15.9 g/dL Final  . Hematocrit 01/21/2016 36.5  34.0 - 46.6 % Final  . MCV 01/21/2016 92  79 - 97 fL Final  . MCH 01/21/2016 31.2  26.6 - 33.0 pg Final  . MCHC 01/21/2016 34.0  31.5 - 35.7 g/dL Final  . RDW 01/21/2016 13.7  12.3 - 15.4 % Final  . Platelets 01/21/2016 259  150 - 379 x10E3/uL Final  . Neutrophils 01/21/2016 57   Final  . Lymphs 01/21/2016 22   Final  . Monocytes 01/21/2016 15   Final  . Eos 01/21/2016 5   Final  . Basos 01/21/2016 1   Final  . Neutrophils  Absolute 01/21/2016 3.8  1.4 - 7.0 x10E3/uL Final  . Lymphocytes Absolute 01/21/2016 1.4  0.7 - 3.1 x10E3/uL Final  . Monocytes Absolute 01/21/2016 1.0* 0.1 - 0.9 x10E3/uL Final  . EOS (ABSOLUTE) 01/21/2016 0.3  0.0 - 0.4 x10E3/uL Final  . Basophils Absolute 01/21/2016 0.0  0.0 - 0.2 x10E3/uL Final  . Immature Granulocytes 01/21/2016 0   Final  . Immature Grans (Abs) 01/21/2016 0.0  0.0 - 0.1 x10E3/uL Final  . Glucose 01/21/2016 176* 65 - 99 mg/dL Final  . BUN 01/21/2016 20  8 - 27 mg/dL Final  . Creatinine, Ser 01/21/2016 1.13* 0.57 - 1.00 mg/dL Final  . GFR calc non Af Amer 01/21/2016 44* >59 mL/min/1.73 Final  . GFR calc Af Amer 01/21/2016 50* >59 mL/min/1.73 Final  . BUN/Creatinine Ratio 01/21/2016 18  12 - 28 Final  . Sodium 01/21/2016 143  134 - 144 mmol/L Final  . Potassium 01/21/2016 5.2  3.5 - 5.2 mmol/L Final  . Chloride 01/21/2016 97  96 - 106 mmol/L Final  . CO2 01/21/2016 24  18 - 29 mmol/L Final  . Calcium 01/21/2016 10.0  8.7 - 10.3 mg/dL Final  . Total Protein 01/21/2016 6.9  6.0 - 8.5 g/dL Final  . Albumin 01/21/2016 4.4  3.5 - 4.7 g/dL Final  . Globulin, Total 01/21/2016 2.5  1.5 - 4.5 g/dL Final  . Albumin/Globulin Ratio 01/21/2016 1.8  1.2 - 2.2 Final  . Bilirubin Total 01/21/2016 0.5  0.0 - 1.2 mg/dL Final  . Alkaline Phosphatase 01/21/2016 124* 39 - 117 IU/L Final  . AST 01/21/2016 15  0 - 40 IU/L Final  . ALT 01/21/2016 12  0 - 32 IU/L Final  . Hep A IgM 01/21/2016 Negative  Negative Final  . Hepatitis B Surface Ag 01/21/2016 Negative  Negative Final  . Hep B C IgM 01/21/2016 Negative  Negative Final  . Hep C Virus Ab 01/21/2016 <0.1  0.0 - 0.9 s/co ratio Final   Comment:  Negative:     < 0.8                              Indeterminate: 0.8 - 0.9                                   Positive:     > 0.9  The CDC recommends that a positive HCV antibody result  be followed up with a HCV Nucleic Acid Amplification  test [id number]).      Assessment:  JUREA CECCARELLI is a 82 y.o. female with a 2 week history of a left axillary mass.  Biopsy on 01/15/2016 revealed an atypical lymphoid proliferation highly suspicious for non-Hodgkin's lymphoma.  There were diffuse sheets of B-cells (CD20 and CD79a +; CD5 and CD10-).  In addition, there was CD138 + abundant plasmacytoid cells with lambda light chain restriction. The lambda staining was apparently present in lymphoid cells.   Symptomatically, she has had some night sweats.  Weight is stable.  Exam reveals palpable left axillary adenopathy.  Plan: 1.  Discuss pathology from core biopsy of left axillary lymph node.  Pathology is consistent with a B-cell lymphoma.  Discuss need for excisional biopsy to determine type of lymphoma and treatment options.  Discuss staging for lymphoma and need for bone marrow aspirate and biopsy.  Procedure discussed in detail.  Will attempt to coordinate procedures (lymph node biopsy and bone marrow) as patient on Eliquis.  Discuss PET scan to assess other adenopathy. 2.  Schedule PET scan. 3.  Discuss lymph node biopsy with Dr. Bary Castilla. 4.  RTC 10 days after lymph node biopsy to discuss results and direction of therapy.   Lequita Asal, MD  01/23/2016, 3:45 PM

## 2016-01-23 NOTE — Telephone Encounter (Signed)
Notified patient as instructed, patient pleased. Discussed follow-up appointments, patient agrees  

## 2016-01-24 DIAGNOSIS — I495 Sick sinus syndrome: Secondary | ICD-10-CM | POA: Diagnosis not present

## 2016-01-24 DIAGNOSIS — R0789 Other chest pain: Secondary | ICD-10-CM | POA: Diagnosis not present

## 2016-01-24 DIAGNOSIS — Z950 Presence of cardiac pacemaker: Secondary | ICD-10-CM | POA: Diagnosis not present

## 2016-01-24 DIAGNOSIS — I4891 Unspecified atrial fibrillation: Secondary | ICD-10-CM | POA: Diagnosis not present

## 2016-01-24 DIAGNOSIS — I25810 Atherosclerosis of coronary artery bypass graft(s) without angina pectoris: Secondary | ICD-10-CM | POA: Diagnosis not present

## 2016-01-24 DIAGNOSIS — I209 Angina pectoris, unspecified: Secondary | ICD-10-CM | POA: Diagnosis not present

## 2016-01-28 ENCOUNTER — Telehealth: Payer: Self-pay | Admitting: *Deleted

## 2016-01-28 NOTE — Telephone Encounter (Signed)
Message for patient to call the office.  

## 2016-01-28 NOTE — Telephone Encounter (Signed)
-----   Message from [name], MD sent at 01/28/2016  4:39 PM EDT ----- Patient needs a left axillary node biopsy.  We can do Thursday or Friday. Needs to stop Eliquis TODAY for either date, or three days before any date next week. See what she is willing to do and what you can schedule. Thanks.

## 2016-01-29 DIAGNOSIS — R0789 Other chest pain: Secondary | ICD-10-CM | POA: Diagnosis not present

## 2016-01-29 NOTE — Telephone Encounter (Signed)
Patient is scheduled for surgery at [facility] on 02/06/16. She will Pre Admit at the hospital on 01/31/16 at 12:15 pm. She will be off of her Eliquis for 3 days prior. Patient is aware of dates, time, and instructions.

## 2016-01-30 ENCOUNTER — Other Ambulatory Visit: Payer: Self-pay | Admitting: General Surgery

## 2016-01-30 NOTE — H&P (Signed)
HPI Joan Hull is a 82 y.o. female. Here today for evaluation of a lower left axillary lump. She noticed a lump about 2 weeks ago, she states it was sore and firm. She states it was about "pecan shell" size. She states the area is gone now. She states she noticed it after trying to do a floor exerciseAs being demonstrated on TV. She does admit to bloating.  She has noted some night sweats, not so severe as to require change in her night garments. She is retired from [employer]. Last mammogram was 2010.(The patient had been told she did she had aged out of routine mammograms. Sister Ignacia Marvel  HPI  Past Medical History  Diagnosis Date  . Anxiety   . Depression   . Hyperlipidemia   . Arthritis   . Osteoporosis   . GERD (gastroesophageal reflux disease)   . CAD (coronary artery disease)   . Interstitial cystitis   . Hx of colonic polyps   . Atrial fibrillation ([place])   . Non Q wave myocardial infarction [facility]) 06/27/11    [facility]    Past Surgical History  Procedure Laterality Date  . Cataract extraction  2005  . Coronary stent placement  7/12    RCA with drug eluting stent---[name]  . Coronary artery bypass graft  1/13    [facility]  . Pacemaker insertion  5/13    [facility]  . Colonoscopy  2007    [name]    Family History  Problem Relation Age of Onset  . Heart disease Mother     cad  . Hypertension Mother   . Heart disease Father     heart attack  . Heart disease Sister     half sister MI  . Heart disease Brother     cabg  . Heart disease Brother   . Cancer Brother     BLADDER  . Cancer Sister     BRAIN  . Cancer Sister 32    OVARIAN    Social History Social History  Substance Use Topics  . Smoking status: Former Smoker    Quit date: 08/18/1962  . Smokeless tobacco: Never Used  .  Alcohol Use: No    Allergies  Allergen Reactions  . Aspirin Other (See Comments)    Bladder spasm  . Diazepam     REACTION: worsened bladder problems    Current Outpatient Prescriptions  Medication Sig Dispense Refill  . atenolol (TENORMIN) 25 MG tablet Take 1 tablet by mouth daily.    Marland Kitchen BIOTIN PO Take by mouth daily.    Marland Kitchen diltiazem (CARDIZEM CD) 180 MG 24 hr capsule Take 180 mg by mouth daily.   5  . ELIQUIS 2.5 MG TABS tablet TAKE ONE TABLET BY MOUTH TWICE DAILY 180 tablet 0  . famotidine (PEPCID) 10 MG tablet Take 10 mg by mouth daily.    . metoprolol (LOPRESSOR) 50 MG tablet TAKE ONE-HALF TABLET BY MOUTH TWICE DAILY 90 tablet 3  . PARoxetine (PAXIL) 10 MG tablet Take 1 tablet (10 mg total) by mouth daily. 90 tablet 3  . Probiotic Product (PROBIOTIC DAILY PO) Take by mouth daily.    Marland Kitchen torsemide (DEMADEX) 20 MG tablet TAKE ONE-HALF TABLET BY MOUTH ONCE DAILY 45 tablet 0  . zolpidem (AMBIEN) 5 MG tablet TAKE ONE TABLET BY MOUTH AT BEDTIME AS NEEDED 90 tablet 0   No current facility-administered medications for this visit.    Review of Systems Review of Systems  Constitutional: Negative.  Respiratory: Positive for shortness of breath.  Cardiovascular: Negative.    Blood pressure 130/70, pulse 62, resp. rate 12, height 4\' 10"  (1.473 m), weight 103 lb (46.72 kg).  Physical Exam Physical Exam  Constitutional: She is oriented to person, place, and time. She appears well-developed and well-nourished.  HENT:  Mouth/Throat: Oropharynx is clear and moist.  Eyes: Conjunctivae are normal. No scleral icterus.  Neck: Neck supple.  Cardiovascular: Normal rate, regular rhythm and normal heart sounds.  Pulses:  Femoral pulses are 2+ on the right side, and 2+ on the left side. Pulmonary/Chest: Effort normal and breath sounds normal. Right breast exhibits no inverted nipple, no mass, no nipple  discharge, no skin change and no tenderness. Left breast exhibits no inverted nipple, no mass, no nipple discharge, no skin change and no tenderness.    Thickening 3 o'clock 4 CFN left breast. Left axillary 3 cm nodule.  Abdominal: Soft. Normal appearance. There is splenomegaly (questionably palpable spleen on deep inspiration.). There is no hepatomegaly. There is no tenderness.  Lymphadenopathy:   Head (right side): No posterior auricular adenopathy present.   Head (left side): No posterior auricular adenopathy present.   She has no cervical adenopathy.   She has axillary adenopathy (left axilla, multiple enlarged nodes.).   Right: No inguinal and no supraclavicular adenopathy present.   Left: No inguinal and no supraclavicular adenopathy present.  Neurological: She is alert and oriented to person, place, and time.  Skin: Skin is warm and dry.  8 mm lesion left anterior calf with scab that was removed and silver nitrate used to the area.  Psychiatric: Her behavior is normal.    Data Reviewed CBC dated 06/22/2015 was normal.   Ultrasound examination of the left breast was undertaken. No obvious cystic or solid lesions within the breast parenchyma. The left axilla showed several large hypoechoic lesions measuring up to 2.5 cm in diameter. Consistent with massively enlarged lymph nodes. Posterior acoustic enhancement noted.  Patient was amenable to core biopsy. In spite of her present therapy with Eliquis it was elected to proceed to obtain a tissue diagnosis. 10 mL of 0.5% Xylocaine with 0.25% Marcaine with 1-200,000 of epinephrine was utilized well tolerated. A 14-gauge Bard biopsy device was utilized and 3 core biopsies were obtained. These were sent in formalin for histology. Scant bleeding noted. Skin defect closed with benzoin and Steri-Strips followed by Telfa and Tegaderm dressing.  The procedure was well-tolerated and postoperative wound care  reviewed.  Assessment    Enlarged left axillary nodes.    Plan    The patient will be contacted when biopsy results are available.       PCP/Ref: [name] This information has been scribed by [name], BSN,BC.   [name] 01/16/2016, 8:44 PM

## 2016-01-31 ENCOUNTER — Ambulatory Visit
Admission: RE | Admit: 2016-01-31 | Discharge: 2016-01-31 | Disposition: A | Discharge status: Home / Self Care | Payer: PPO | Source: Ambulatory Visit | Attending: Hematology and Oncology | Admitting: Hematology and Oncology

## 2016-01-31 ENCOUNTER — Encounter
Admission: RE | Admit: 2016-01-31 | Discharge: 2016-01-31 | Disposition: A | Discharge status: Home / Self Care | Payer: PPO | Source: Ambulatory Visit | Attending: General Surgery | Admitting: General Surgery

## 2016-01-31 DIAGNOSIS — R911 Solitary pulmonary nodule: Secondary | ICD-10-CM | POA: Diagnosis not present

## 2016-01-31 DIAGNOSIS — M8580 Other specified disorders of bone density and structure, unspecified site: Secondary | ICD-10-CM | POA: Diagnosis not present

## 2016-01-31 DIAGNOSIS — M4854XA Collapsed vertebra, not elsewhere classified, thoracic region, initial encounter for fracture: Secondary | ICD-10-CM | POA: Insufficient documentation

## 2016-01-31 DIAGNOSIS — C8594 Non-Hodgkin lymphoma, unspecified, lymph nodes of axilla and upper limb: Secondary | ICD-10-CM | POA: Diagnosis not present

## 2016-01-31 DIAGNOSIS — C8590 Non-Hodgkin lymphoma, unspecified, unspecified site: Secondary | ICD-10-CM | POA: Diagnosis not present

## 2016-01-31 HISTORY — DX: Essential (primary) hypertension: I10

## 2016-01-31 HISTORY — DX: Reserved for inherently not codable concepts without codable children: IMO0001

## 2016-01-31 HISTORY — DX: Cardiac arrhythmia, unspecified: I49.9

## 2016-01-31 HISTORY — DX: Cardiac murmur, unspecified: R01.1

## 2016-01-31 HISTORY — DX: Malignant (primary) neoplasm, unspecified: C80.1

## 2016-01-31 HISTORY — DX: Presence of cardiac pacemaker: Z95.0

## 2016-01-31 LAB — GLUCOSE, CAPILLARY: Glucose-Capillary: 90 mg/dL (ref 65–99)

## 2016-01-31 MED ORDER — FLUDEOXYGLUCOSE F - 18 (FDG) INJECTION
12.0000 | Freq: Once | INTRAVENOUS | Status: AC | PRN
  Administered 2016-01-31: 12.11 via INTRAVENOUS

## 2016-01-31 NOTE — Pre-Procedure Instructions (Addendum)
CLEARED BY DR Montevallo RISK 01/31/16. NEGATIVE STRESS TEST 01/29/16. OR NOTIFIED HAS PACEMAKER

## 2016-01-31 NOTE — Patient Instructions (Signed)
  Your procedure is scheduled on 02/06/16 Report to Day Surgery.[facility] SECOND FLOOR To find out your arrival time please call [phone] between 1PM - 3PM on 02/05/16.  Remember: Instructions that are not followed completely may result in serious medical risk, up to and including death, or upon the discretion of your surgeon and anesthesiologist your surgery may need to be rescheduled.    _X___ 1. Do not eat food or drink liquids after midnight. No gum chewing or hard candies.     __X__ 2. No Alcohol for 24 hours before or after surgery.   _X___ 3. Do Not Smoke For 24 Hours Prior to Your Surgery.   ____ 4. Bring all medications with you on the day of surgery if instructed.    X____ 5. Notify your doctor if there is any change in your medical condition     (cold, fever, infections).       Do not wear jewelry, make-up, hairpins, clips or nail polish.  Do not wear lotions, powders, or perfumes. You may wear deodorant.  Do not shave 48 hours prior to surgery. Men may shave face and neck.  Do not bring valuables to the hospital.    [facility] is not responsible for any belongings or valuables.               Contacts, dentures or bridgework may not be worn into surgery.  Leave your suitcase in the car. After surgery it may be brought to your room.  For patients admitted to the hospital, discharge time is determined by your                treatment team.   Patients discharged the day of surgery will not be allowed to drive home.   Please read over the following fact sheets that you were given:   Surgical Site Infection Prevention   __X__ Take these medicines the morning of surgery with A SIP OF WATER:    1.ATENOLOL  2. DILTIAZEM  3. FAMOTIDINE AT BEDTIME 02/05/16 AND AM OF SURGERY  4. METOPROLOL  5. PAROXETINE  6.  ____ Fleet Enema (as directed)   __X__ Use CHG Soap as directed    TEST AREA FIRST AND IF ITCHING OCCURS,RINSE QUICKLY AND DO NOT USE ____ Use inhalers on the  day of surgery  ____ Stop metformin 2 days prior to surgery    ____ Take 1/2 of usual insulin dose the night before surgery and none on the morning of surgery.   __X__ Stop Coumadin/Plavix/aspirin on       STOP ELIQUIS 3 DAYS BEFORE SURGERY ____ Stop Anti-inflammatories on   ____ Stop supplements until after surgery.    ____ Bring C-Pap to the hospital.

## 2016-02-04 ENCOUNTER — Telehealth: Payer: Self-pay

## 2016-02-04 NOTE — Telephone Encounter (Signed)
Called pt back to reports to pt bone marrow and biopsy from Dr. Bary Castilla could not be scheduled in the same day.  Pt very concerned about having to pay her co-pay twice and verbalizes that.  I apologized to pt that I am unaware of her co pay and the costs of her procedures.  Pt verbalized again a concern about having to have transportation for both.  I informed pt that yes she would have to have transportation for bone marrow.  She then asked how long her biopsy would take with Dr. Bary Castilla.  I apologized that I did not know the total time and advised her to call Dr. Curly Shores office.  Pt was mostly concerned over co pay costs.  No other concerns voiced.

## 2016-02-06 ENCOUNTER — Ambulatory Visit
Admission: RE | Admit: 2016-02-06 | Discharge: 2016-02-06 | Disposition: A | Discharge status: Home / Self Care | Payer: PPO | Source: Ambulatory Visit | Attending: General Surgery | Admitting: General Surgery

## 2016-02-06 ENCOUNTER — Ambulatory Visit: Payer: PPO | Admitting: Anesthesiology

## 2016-02-06 ENCOUNTER — Encounter: Payer: Self-pay | Admitting: *Deleted

## 2016-02-06 ENCOUNTER — Encounter
Admission: RE | Disposition: A | Discharge status: Home / Self Care | Payer: Self-pay | Source: Ambulatory Visit | Attending: General Surgery

## 2016-02-06 DIAGNOSIS — Z955 Presence of coronary angioplasty implant and graft: Secondary | ICD-10-CM | POA: Insufficient documentation

## 2016-02-06 DIAGNOSIS — F329 Major depressive disorder, single episode, unspecified: Secondary | ICD-10-CM | POA: Insufficient documentation

## 2016-02-06 DIAGNOSIS — C8514 Unspecified B-cell lymphoma, lymph nodes of axilla and upper limb: Secondary | ICD-10-CM | POA: Insufficient documentation

## 2016-02-06 DIAGNOSIS — R2232 Localized swelling, mass and lump, left upper limb: Secondary | ICD-10-CM

## 2016-02-06 DIAGNOSIS — I252 Old myocardial infarction: Secondary | ICD-10-CM | POA: Diagnosis not present

## 2016-02-06 DIAGNOSIS — C8594 Non-Hodgkin lymphoma, unspecified, lymph nodes of axilla and upper limb: Secondary | ICD-10-CM | POA: Diagnosis not present

## 2016-02-06 DIAGNOSIS — F418 Other specified anxiety disorders: Secondary | ICD-10-CM | POA: Diagnosis not present

## 2016-02-06 DIAGNOSIS — I4891 Unspecified atrial fibrillation: Secondary | ICD-10-CM | POA: Diagnosis not present

## 2016-02-06 DIAGNOSIS — K219 Gastro-esophageal reflux disease without esophagitis: Secondary | ICD-10-CM | POA: Diagnosis not present

## 2016-02-06 DIAGNOSIS — I2510 Atherosclerotic heart disease of native coronary artery without angina pectoris: Secondary | ICD-10-CM | POA: Insufficient documentation

## 2016-02-06 DIAGNOSIS — M1990 Unspecified osteoarthritis, unspecified site: Secondary | ICD-10-CM | POA: Diagnosis not present

## 2016-02-06 DIAGNOSIS — Z87891 Personal history of nicotine dependence: Secondary | ICD-10-CM | POA: Diagnosis not present

## 2016-02-06 DIAGNOSIS — C8588 Other specified types of non-Hodgkin lymphoma, lymph nodes of multiple sites: Secondary | ICD-10-CM | POA: Insufficient documentation

## 2016-02-06 DIAGNOSIS — F419 Anxiety disorder, unspecified: Secondary | ICD-10-CM | POA: Diagnosis not present

## 2016-02-06 DIAGNOSIS — I10 Essential (primary) hypertension: Secondary | ICD-10-CM | POA: Insufficient documentation

## 2016-02-06 DIAGNOSIS — C8590 Non-Hodgkin lymphoma, unspecified, unspecified site: Secondary | ICD-10-CM | POA: Diagnosis not present

## 2016-02-06 DIAGNOSIS — C8580 Other specified types of non-Hodgkin lymphoma, unspecified site: Secondary | ICD-10-CM | POA: Insufficient documentation

## 2016-02-06 DIAGNOSIS — G709 Myoneural disorder, unspecified: Secondary | ICD-10-CM | POA: Diagnosis not present

## 2016-02-06 DIAGNOSIS — C8384 Other non-follicular lymphoma, lymph nodes of axilla and upper limb: Secondary | ICD-10-CM | POA: Diagnosis not present

## 2016-02-06 DIAGNOSIS — Z950 Presence of cardiac pacemaker: Secondary | ICD-10-CM | POA: Diagnosis not present

## 2016-02-06 DIAGNOSIS — R590 Localized enlarged lymph nodes: Secondary | ICD-10-CM | POA: Insufficient documentation

## 2016-02-06 DIAGNOSIS — R599 Enlarged lymph nodes, unspecified: Secondary | ICD-10-CM | POA: Diagnosis not present

## 2016-02-06 HISTORY — PX: AXILLARY LYMPH NODE BIOPSY: SHX5737

## 2016-02-06 SURGERY — AXILLARY LYMPH NODE BIOPSY
Anesthesia: General | Laterality: Left | Wound class: Clean

## 2016-02-06 MED ORDER — FENTANYL CITRATE (PF) 100 MCG/2ML IJ SOLN
INTRAMUSCULAR | Status: AC
  Administered 2016-02-06: 25 ug via INTRAVENOUS
  Filled 2016-02-06: qty 2

## 2016-02-06 MED ORDER — FENTANYL CITRATE (PF) 100 MCG/2ML IJ SOLN
25.0000 ug | INTRAMUSCULAR | Status: DC | PRN
  Administered 2016-02-06 (×2): 25 ug via INTRAVENOUS

## 2016-02-06 MED ORDER — TRAMADOL HCL 50 MG PO TABS: 50.0000 mg | ORAL_TABLET | ORAL | Status: DC | PRN

## 2016-02-06 MED ORDER — BUPIVACAINE HCL (PF) 0.5 % IJ SOLN
INTRAMUSCULAR | Status: AC
  Filled 2016-02-06: qty 30

## 2016-02-06 MED ORDER — LACTATED RINGERS IV SOLN
INTRAVENOUS | Status: DC
  Administered 2016-02-06: 11:00:00 via INTRAVENOUS

## 2016-02-06 MED ORDER — DEXAMETHASONE SODIUM PHOSPHATE 10 MG/ML IJ SOLN
INTRAMUSCULAR | Status: DC | PRN
  Administered 2016-02-06: 5 mg via INTRAVENOUS

## 2016-02-06 MED ORDER — BUPIVACAINE-EPINEPHRINE (PF) 0.5% -1:200000 IJ SOLN
INTRAMUSCULAR | Status: AC
  Filled 2016-02-06: qty 30

## 2016-02-06 MED ORDER — BUPIVACAINE-EPINEPHRINE 0.5% -1:200000 IJ SOLN
INTRAMUSCULAR | Status: DC | PRN
  Administered 2016-02-06: 17 mL

## 2016-02-06 MED ORDER — ONDANSETRON HCL 4 MG/2ML IJ SOLN
INTRAMUSCULAR | Status: DC | PRN
  Administered 2016-02-06: 4 mg via INTRAVENOUS

## 2016-02-06 MED ORDER — ONDANSETRON HCL 4 MG/2ML IJ SOLN: 4.0000 mg | Freq: Once | INTRAMUSCULAR | Status: DC | PRN

## 2016-02-06 MED ORDER — METHYLENE BLUE 0.5 % INJ SOLN
INTRAVENOUS | Status: AC
  Filled 2016-02-06: qty 10

## 2016-02-06 MED ORDER — FENTANYL CITRATE (PF) 100 MCG/2ML IJ SOLN
INTRAMUSCULAR | Status: DC | PRN
  Administered 2016-02-06: 25 ug via INTRAVENOUS

## 2016-02-06 MED ORDER — PROPOFOL 10 MG/ML IV BOLUS
INTRAVENOUS | Status: DC | PRN
  Administered 2016-02-06: 100 mg via INTRAVENOUS

## 2016-02-06 SURGICAL SUPPLY — 33 items
APPLIER CLIP 11 MED OPEN (CLIP)
BLADE SURG 15 STRL SS SAFETY (BLADE) ×3 IMPLANT
CANISTER SUCT 1200ML W/VALVE (MISCELLANEOUS) ×3 IMPLANT
CHLORAPREP W/TINT 26ML (MISCELLANEOUS) ×3 IMPLANT
CLIP APPLIE 11 MED OPEN (CLIP) IMPLANT
CLOSURE WOUND 1/2 X4 (GAUZE/BANDAGES/DRESSINGS) ×1
CNTNR SPEC 2.5X3XGRAD LEK (MISCELLANEOUS) ×1
CONT SPEC 4OZ STER OR WHT (MISCELLANEOUS) ×2
CONTAINER SPEC 2.5X3XGRAD LEK (MISCELLANEOUS) ×1 IMPLANT
DRAPE LAPAROTOMY TRNSV 106X77 (MISCELLANEOUS) ×3 IMPLANT
DRESSING TELFA 4X3 1S ST N-ADH (GAUZE/BANDAGES/DRESSINGS) ×3 IMPLANT
DRSG TEGADERM 4X4.75 (GAUZE/BANDAGES/DRESSINGS) ×3 IMPLANT
ELECT REM PT RETURN 9FT ADLT (ELECTROSURGICAL) ×3
ELECTRODE REM PT RTRN 9FT ADLT (ELECTROSURGICAL) ×1 IMPLANT
GLOVE BIO SURGEON STRL SZ7.5 (GLOVE) ×12 IMPLANT
GLOVE INDICATOR 8.0 STRL GRN (GLOVE) ×9 IMPLANT
GOWN STRL REUS W/ TWL LRG LVL3 (GOWN DISPOSABLE) ×3 IMPLANT
GOWN STRL REUS W/TWL LRG LVL3 (GOWN DISPOSABLE) ×6
KIT RM TURNOVER STRD PROC AR (KITS) ×3 IMPLANT
LABEL OR SOLS (LABEL) ×3 IMPLANT
MARGIN MAP 10MM (MISCELLANEOUS) IMPLANT
NEEDLE HYPO 25X1 1.5 SAFETY (NEEDLE) ×3 IMPLANT
NS IRRIG 500ML POUR BTL (IV SOLUTION) ×3 IMPLANT
PACK BASIN MINOR ARMC (MISCELLANEOUS) ×3 IMPLANT
SHEARS FOC LG CVD HARMONIC 17C (MISCELLANEOUS) IMPLANT
STRIP CLOSURE SKIN 1/2X4 (GAUZE/BANDAGES/DRESSINGS) ×2 IMPLANT
SUT VIC AB 2-0 CT1 27 (SUTURE) ×2
SUT VIC AB 2-0 CT1 TAPERPNT 27 (SUTURE) ×1 IMPLANT
SUT VIC AB 3-0 54X BRD REEL (SUTURE) ×1 IMPLANT
SUT VIC AB 3-0 BRD 54 (SUTURE) ×2
SUT VIC AB 4-0 PS2 18 (SUTURE) ×3 IMPLANT
SWABSTK COMLB BENZOIN TINCTURE (MISCELLANEOUS) ×3 IMPLANT
SYR CONTROL 10ML (SYRINGE) ×3 IMPLANT

## 2016-02-06 NOTE — Anesthesia Procedure Notes (Signed)
Procedure Name: LMA Insertion Date/Time: 02/06/2016 11:56 AM Performed by: Lorie Apley Pre-anesthesia Checklist: Patient identified Patient Re-evaluated:Patient Re-evaluated prior to inductionOxygen Delivery Method: Circle system utilized Preoxygenation: Pre-oxygenation with 100% oxygen Intubation Type: IV induction Ventilation: Mask ventilation without difficulty LMA: LMA inserted LMA Size: 4.0

## 2016-02-06 NOTE — Anesthesia Preprocedure Evaluation (Addendum)
Anesthesia Evaluation  Patient identified by MRN, date of birth, ID band Patient awake    Reviewed: Allergy & Precautions, NPO status , Patient's Chart, lab work & pertinent test results, reviewed documented beta blocker date and time   Airway Mallampati: II  TM Distance: >3 FB     Dental  (+) Chipped   Pulmonary shortness of breath, former smoker,           Cardiovascular hypertension, Pt. on medications and Pt. on home beta blockers + angina + CAD and + Past MI  + dysrhythmias Atrial Fibrillation + pacemaker      Neuro/Psych PSYCHIATRIC DISORDERS Anxiety Depression  Neuromuscular disease    GI/Hepatic GERD  Controlled,  Endo/Other    Renal/GU      Musculoskeletal  (+) Arthritis ,   Abdominal   Peds  Hematology   Anesthesia Other Findings Stent 2012. Pacemaker. Lymphoma in chest, abdomen and pelvic areas. Renal insufficiency.  Reproductive/Obstetrics                            Anesthesia Physical Anesthesia Plan  ASA: III  Anesthesia Plan: General   Post-op Pain Management:    Induction: Intravenous  Airway Management Planned: LMA  Additional Equipment:   Intra-op Plan:   Post-operative Plan:   Informed Consent: I have reviewed the patients History and Physical, chart, labs and discussed the procedure including the risks, benefits and alternatives for the proposed anesthesia with the patient or authorized representative who has indicated his/her understanding and acceptance.     Plan Discussed with: CRNA  Anesthesia Plan Comments:         Anesthesia Quick Evaluation

## 2016-02-06 NOTE — H&P (Signed)
No change in clinical history or exam. Formal node excision requested to sub-type lymphoma.

## 2016-02-06 NOTE — Discharge Instructions (Signed)

## 2016-02-06 NOTE — Anesthesia Postprocedure Evaluation (Signed)
Anesthesia Post Note  Patient: Joan Hull  Procedure(s) Performed: Procedure(s) (LRB): AXILLARY LYMPH NODE BIOPSY (Left)  Patient location during evaluation: PACU Anesthesia Type: General Level of consciousness: awake and alert Pain management: pain level controlled Vital Signs Assessment: post-procedure vital signs reviewed and stable Respiratory status: spontaneous breathing, nonlabored ventilation, respiratory function stable and patient connected to nasal cannula oxygen Cardiovascular status: blood pressure returned to baseline and stable Postop Assessment: no signs of nausea or vomiting Anesthetic complications: no    Last Vitals:  Filed Vitals:   02/06/16 1330 02/06/16 1346  BP: 158/73 147/69  Pulse: 60 60  Temp:    Resp: 14 14    Last Pain:  Filed Vitals:   02/06/16 1428  PainSc: 1                  Anaiz Qazi S

## 2016-02-06 NOTE — Transfer of Care (Signed)
Immediate Anesthesia Transfer of Care Note  Patient: Joan Hull  Procedure(s) Performed: Procedure(s): AXILLARY LYMPH NODE BIOPSY (Left)  Patient Location: PACU  Anesthesia Type:General  Level of Consciousness: sedated  Airway & Oxygen Therapy: Patient Spontanous Breathing and Patient connected to face mask oxygen  Post-op Assessment: Report given to RN and Post -op Vital signs reviewed and stable  Post vital signs: Reviewed and stable  Last Vitals:  Filed Vitals:   02/06/16 1036 02/06/16 1225  BP: 171/76 110/60  Pulse: 66 60  Temp: 36.5 C 36.2 C  Resp: 16 14    Last Pain:  Filed Vitals:   02/06/16 1227  PainSc: 0-No pain         Complications: no Anesthesia complications

## 2016-02-06 NOTE — Op Note (Signed)
Preoperative diagnosis: Lymphoma.  Postoperative diagnosis: Same.  Operative procedure: Excision left axillary lymph node.  Operating surgeon: Ollen Bowl, M.D.  Anesthesia: Gen. by LMA, Marcaine 0.5% with 1-200,000 epinephrine, 17 mL local infiltration.  Estimated blood loss: Minimal.  Clinical note: This 82 year old woman presented with left axillary adenopathy and core biopsy suggested lymphoma. Formal excision was requested by the medical oncology service.  The patient discontinuing her oral anticoagulation 3 days prior to the procedure.  Operative note: With the patient under adequate general anesthesia the area was prepped with ChloraPrep and draped. A small roll was placed behind the shoulder. The palpable node was identified and the area infiltrated with Marcaine. A transverse incision was made carried down through skin subtenon's tissue with hemostasis achieved by electrocautery. The node was mobilized from [place] and hemostasis achieved with 3-0 Vicryl ties. This was sent fresh to pathology. The axillary envelope was closed with a running 2-0 Vicryl suture. The adipose layer was approximated in a similar fashion. The skin was closed with a running 4-0 Vicryl septic suture. Benzoin, Steri-Strips, Telfa and Tegaderm dressing was applied.  Patient tolerated the procedure well and was taken to recovery in stable condition.

## 2016-02-13 ENCOUNTER — Encounter: Payer: Self-pay | Admitting: General Surgery

## 2016-02-13 ENCOUNTER — Ambulatory Visit (INDEPENDENT_AMBULATORY_CARE_PROVIDER_SITE_OTHER): Payer: PPO | Admitting: General Surgery

## 2016-02-13 VITALS — BP 138/78 | HR 64 | Resp 12 | Ht <= 58 in | Wt 101.0 lb

## 2016-02-13 DIAGNOSIS — C8590 Non-Hodgkin lymphoma, unspecified, unspecified site: Secondary | ICD-10-CM

## 2016-02-13 NOTE — Patient Instructions (Addendum)
The patient is aware to call back for any questions or concerns.  

## 2016-02-13 NOTE — Progress Notes (Signed)
Patient ID: Joan Hull, female   DOB: Oct 26, 1925, 82 y.o.   MRN: [id number]  Chief Complaint  Patient presents with  . Follow-up    HPI Joan Hull is a 82 y.o. female.  Here today for postoperative visit, left axillary node excision. She states she is doing well, no pain.  HPI  Past Medical History  Diagnosis Date  . Anxiety   . Depression   . Hyperlipidemia   . Arthritis   . GERD (gastroesophageal reflux disease)   . CAD (coronary artery disease)   . Interstitial cystitis   . Hx of colonic polyps   . Atrial fibrillation ([place])   . Non Q wave myocardial infarction ([place]) 06/27/11    [facility]  . Osteoporosis   . Dysrhythmia   . Hypertension   . Presence of permanent cardiac pacemaker   . Heart murmur   . Shortness of breath dyspnea     doe  . Cancer [facility]) 2017    lymphoma    Past Surgical History  Procedure Laterality Date  . Cataract extraction Right 2005  . Coronary stent placement  7/12    RCA with drug eluting stent---[name]  . Coronary artery bypass graft  1/13    [facility]  . Pacemaker insertion  5/13    [facility]  . Colonoscopy  2007    [name]  . Eye surgery    . Cardiac catheterization    . Coronary angioplasty    . Insert / replace / remove pacemaker    . Axillary lymph node biopsy Left 02/06/2016    Procedure: AXILLARY LYMPH NODE BIOPSY;  Surgeon: [name], MD;  Location: [facility] ORS;  Service: General;  Laterality: Left;    Family History  Problem Relation Age of Onset  . Heart disease Mother     cad  . Hypertension Mother   . Heart disease Father     heart attack  . Heart disease Sister     half sister MI  . Heart disease Brother     cabg  . Heart disease Brother   . Cancer Brother     BLADDER  . Cancer Sister     BRAIN  . Cancer Sister 66    [facility]    Social History Social History  Substance Use Topics  . Smoking status: Former Smoker    Quit date: 08/18/1962  . Smokeless tobacco: Never Used  . Alcohol Use: No     Allergies  Allergen Reactions  . Aspirin Other (See Comments)    Bladder spasm  . Diazepam     REACTION: worsened bladder problems    Current Outpatient Prescriptions  Medication Sig Dispense Refill  . atenolol (TENORMIN) 25 MG tablet Take 1 tablet by mouth daily.    Marland Kitchen diltiazem (CARDIZEM CD) 180 MG 24 hr capsule Take 180 mg by mouth daily.   5  . ELIQUIS 2.5 MG TABS tablet Take 2.5 mg by mouth 2 (two) times daily.     . famotidine (PEPCID) 10 MG tablet Take 10 mg by mouth daily.    . metoprolol (LOPRESSOR) 50 MG tablet TAKE ONE-HALF TABLET BY MOUTH TWICE DAILY 90 tablet 3  . Multiple Vitamins-Minerals (CENTRUM SILVER 50+WOMEN PO) Take 1 tablet by mouth.    Marland Kitchen PARoxetine (PAXIL) 10 MG tablet Take 1 tablet (10 mg total) by mouth daily. 90 tablet 3  . Probiotic Product (PROBIOTIC DAILY PO) Take 1 capsule by mouth daily.     Marland Kitchen torsemide (  DEMADEX) 20 MG tablet TAKE ONE-HALF TABLET BY MOUTH ONCE DAILY 45 tablet 0  . zolpidem (AMBIEN) 5 MG tablet TAKE ONE TABLET BY MOUTH AT BEDTIME AS NEEDED (Patient taking differently: TAKE ONE TABLET BY MOUTH AT BEDTIME) 90 tablet 0   No current facility-administered medications for this visit.    Review of Systems Review of Systems  Constitutional: Negative.   Respiratory: Negative.   Cardiovascular: Negative.     Blood pressure 138/78, pulse 64, resp. rate 12, height 4\' 10"  (1.473 m), weight 101 lb (45.813 kg).  Physical Exam Physical Exam  Constitutional: She is oriented to person, place, and time. She appears well-developed and well-nourished.  Lymphadenopathy:  Left axillary incision well healed, mild fullness noted.  Neurological: She is alert and oriented to person, place, and time.  Skin: Skin is warm and dry.  Psychiatric: Her behavior is normal.    Data Reviewed A. 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    Plan    Patient will follow-up with Nolon Stalls, M.D. From medical oncology.    Follow up as needed.  PCP:  Viviana Simpler  Dr. Mike Gip  This information has been scribed by [name], BSN,BC.   [name] 02/15/2016, 4:24 PM

## 2016-02-15 DIAGNOSIS — C8590 Non-Hodgkin lymphoma, unspecified, unspecified site: Secondary | ICD-10-CM | POA: Insufficient documentation

## 2016-02-21 ENCOUNTER — Other Ambulatory Visit: Payer: Self-pay | Admitting: Hematology and Oncology

## 2016-02-21 ENCOUNTER — Encounter: Payer: Self-pay | Admitting: Hematology and Oncology

## 2016-02-21 DIAGNOSIS — C8588 Other specified types of non-Hodgkin lymphoma, lymph nodes of multiple sites: Secondary | ICD-10-CM

## 2016-02-21 DIAGNOSIS — C8580 Other specified types of non-Hodgkin lymphoma, unspecified site: Secondary | ICD-10-CM

## 2016-02-22 ENCOUNTER — Other Ambulatory Visit: Payer: Self-pay

## 2016-02-22 ENCOUNTER — Inpatient Hospital Stay: Payer: PPO | Attending: Hematology and Oncology | Admitting: Hematology and Oncology

## 2016-02-22 ENCOUNTER — Inpatient Hospital Stay: Payer: PPO

## 2016-02-22 ENCOUNTER — Encounter: Payer: Self-pay | Admitting: Hematology and Oncology

## 2016-02-22 VITALS — BP 147/77 | HR 65 | Temp 97.2°F | Ht <= 58 in | Wt 102.6 lb

## 2016-02-22 DIAGNOSIS — N289 Disorder of kidney and ureter, unspecified: Secondary | ICD-10-CM | POA: Diagnosis not present

## 2016-02-22 DIAGNOSIS — E785 Hyperlipidemia, unspecified: Secondary | ICD-10-CM | POA: Insufficient documentation

## 2016-02-22 DIAGNOSIS — K219 Gastro-esophageal reflux disease without esophagitis: Secondary | ICD-10-CM | POA: Insufficient documentation

## 2016-02-22 DIAGNOSIS — Z7901 Long term (current) use of anticoagulants: Secondary | ICD-10-CM | POA: Diagnosis not present

## 2016-02-22 DIAGNOSIS — I252 Old myocardial infarction: Secondary | ICD-10-CM | POA: Diagnosis not present

## 2016-02-22 DIAGNOSIS — Z5112 Encounter for antineoplastic immunotherapy: Secondary | ICD-10-CM | POA: Insufficient documentation

## 2016-02-22 DIAGNOSIS — M1990 Unspecified osteoarthritis, unspecified site: Secondary | ICD-10-CM | POA: Diagnosis not present

## 2016-02-22 DIAGNOSIS — Z950 Presence of cardiac pacemaker: Secondary | ICD-10-CM | POA: Insufficient documentation

## 2016-02-22 DIAGNOSIS — I4891 Unspecified atrial fibrillation: Secondary | ICD-10-CM | POA: Insufficient documentation

## 2016-02-22 DIAGNOSIS — I2510 Atherosclerotic heart disease of native coronary artery without angina pectoris: Secondary | ICD-10-CM | POA: Diagnosis not present

## 2016-02-22 DIAGNOSIS — Z87891 Personal history of nicotine dependence: Secondary | ICD-10-CM | POA: Insufficient documentation

## 2016-02-22 DIAGNOSIS — Z79899 Other long term (current) drug therapy: Secondary | ICD-10-CM

## 2016-02-22 DIAGNOSIS — I10 Essential (primary) hypertension: Secondary | ICD-10-CM | POA: Diagnosis not present

## 2016-02-22 DIAGNOSIS — R0609 Other forms of dyspnea: Secondary | ICD-10-CM | POA: Insufficient documentation

## 2016-02-22 DIAGNOSIS — C8580 Other specified types of non-Hodgkin lymphoma, unspecified site: Secondary | ICD-10-CM

## 2016-02-22 DIAGNOSIS — R61 Generalized hyperhidrosis: Secondary | ICD-10-CM

## 2016-02-22 DIAGNOSIS — C8308 Small cell B-cell lymphoma, lymph nodes of multiple sites: Secondary | ICD-10-CM | POA: Diagnosis not present

## 2016-02-22 LAB — CBC WITH DIFFERENTIAL/PLATELET
Basophils Absolute: 0 10*3/uL (ref 0–0.1)
Basophils Relative: 1 %
Eosinophils Absolute: 0.3 10*3/uL (ref 0–0.7)
Eosinophils Relative: 5 %
HCT: 39.5 % (ref 35.0–47.0)
Hemoglobin: 13.7 g/dL (ref 12.0–16.0)
Lymphocytes Relative: 17 %
Lymphs Abs: 1.2 10*3/uL (ref 1.0–3.6)
MCH: 32 pg (ref 26.0–34.0)
MCHC: 34.6 g/dL (ref 32.0–36.0)
MCV: 92.7 fL (ref 80.0–100.0)
Monocytes Absolute: 0.9 10*3/uL (ref 0.2–0.9)
Monocytes Relative: 14 %
Neutro Abs: 4.2 10*3/uL (ref 1.4–6.5)
Neutrophils Relative %: 63 %
Platelets: 191 10*3/uL (ref 150–440)
RBC: 4.26 MIL/uL (ref 3.80–5.20)
RDW: 14.5 % (ref 11.5–14.5)
WBC: 6.7 10*3/uL (ref 3.6–11.0)

## 2016-02-22 LAB — URIC ACID: Uric Acid, Serum: 7.1 mg/dL — ABNORMAL HIGH (ref 2.3–6.6)

## 2016-02-22 LAB — BASIC METABOLIC PANEL
Anion gap: 9 (ref 5–15)
BUN: 22 mg/dL — ABNORMAL HIGH (ref 6–20)
CO2: 25 mmol/L (ref 22–32)
Calcium: 10 mg/dL (ref 8.9–10.3)
Chloride: 102 mmol/L (ref 101–111)
Creatinine, Ser: 0.97 mg/dL (ref 0.44–1.00)
GFR calc Af Amer: 59 mL/min — ABNORMAL LOW (ref >60)
GFR calc non Af Amer: 51 mL/min — ABNORMAL LOW (ref >60)
Glucose, Bld: 104 mg/dL — ABNORMAL HIGH (ref 65–99)
Potassium: 4.4 mmol/L (ref 3.5–5.1)
Sodium: 136 mmol/L (ref 135–145)

## 2016-02-22 MED ORDER — ALLOPURINOL 300 MG PO TABS: 300.0000 mg | ORAL_TABLET | Freq: Every day | ORAL | Status: DC

## 2016-02-22 NOTE — Patient Instructions (Signed)
Rituximab injection What is this medicine? RITUXIMAB (ri TUX i mab) is a monoclonal antibody. It is used commonly to treat non-Hodgkin lymphoma and other conditions. It is also used to treat rheumatoid arthritis (RA). In RA, this medicine slows the inflammatory process and help reduce joint pain and swelling. This medicine is often used with other cancer or arthritis medications. This medicine may be used for other purposes; ask your health care provider or pharmacist if you have questions. What should I tell my health care provider before I take this medicine? They need to know if you have any of these conditions: -blood disorders -heart disease -history of hepatitis B -infection (especially a virus infection such as chickenpox, cold sores, or herpes) -irregular heartbeat -kidney disease -lung or breathing disease, like asthma -lupus -an unusual or allergic reaction to rituximab, mouse proteins, other medicines, foods, dyes, or preservatives -pregnant or trying to get pregnant -breast-feeding How should I use this medicine? This medicine is for infusion into a vein. It is administered in a hospital or clinic by a specially trained health care professional. A special MedGuide will be given to you by the pharmacist with each prescription and refill. Be sure to read this information carefully each time. Talk to your pediatrician regarding the use of this medicine in children. This medicine is not approved for use in children. Overdosage: If you think you have taken too much of this medicine contact a poison control center or emergency room at once. NOTE: This medicine is only for you. Do not share this medicine with others. What if I miss a dose? It is important not to miss a dose. Call your doctor or health care professional if you are unable to keep an appointment. What may interact with this medicine? -cisplatin -medicines for blood pressure -some other medicines for  arthritis -vaccines This list may not describe all possible interactions. Give your health care provider a list of all the medicines, herbs, non-prescription drugs, or dietary supplements you use. Also tell them if you smoke, drink alcohol, or use illegal drugs. Some items may interact with your medicine. What should I watch for while using this medicine? Report any side effects that you notice during your treatment right away, such as changes in your breathing, fever, chills, dizziness or lightheadedness. These effects are more common with the first dose. Visit your prescriber or health care professional for checks on your progress. You will need to have regular blood work. Report any other side effects. The side effects of this medicine can continue after you finish your treatment. Continue your course of treatment even though you feel ill unless your doctor tells you to stop. Call your doctor or health care professional for advice if you get a fever, chills or sore throat, or other symptoms of a cold or flu. Do not treat yourself. This drug decreases your body's ability to fight infections. Try to avoid being around people who are sick. This medicine may increase your risk to bruise or bleed. Call your doctor or health care professional if you notice any unusual bleeding. Be careful brushing and flossing your teeth or using a toothpick because you may get an infection or bleed more easily. If you have any dental work done, tell your dentist you are receiving this medicine. Avoid taking products that contain aspirin, acetaminophen, ibuprofen, naproxen, or ketoprofen unless instructed by your doctor. These medicines may hide a fever. Do not become pregnant while taking this medicine. Women should inform their doctor if   they wish to become pregnant or think they might be pregnant. There is a potential for serious side effects to an unborn child. Talk to your health care professional or pharmacist for more  information. Do not breast-feed an infant while taking this medicine. What side effects may I notice from receiving this medicine? Side effects that you should report to your doctor or health care professional as soon as possible: -allergic reactions like skin rash, itching or hives, swelling of the face, lips, or tongue -low blood counts - this medicine may decrease the number of white blood cells, red blood cells and platelets. You may be at increased risk for infections and bleeding. -signs of infection - fever or chills, cough, sore throat, pain or difficulty passing urine -signs of decreased platelets or bleeding - bruising, pinpoint red spots on the skin, black, tarry stools, blood in the urine -signs of decreased red blood cells - unusually weak or tired, fainting spells, lightheadedness -breathing problems -confused, not responsive -chest pain -fast, irregular heartbeat -feeling faint or lightheaded, falls -mouth sores -redness, blistering, peeling or loosening of the skin, including inside the mouth -stomach pain -swelling of the ankles, feet, or hands -trouble passing urine or change in the amount of urine Side effects that usually do not require medical attention (report to your doctor or other health care professional if they continue or are bothersome): -anxiety -headache -loss of appetite -muscle aches -nausea -night sweats This list may not describe all possible side effects. Call your doctor for medical advice about side effects. You may report side effects to FDA at 1-800-FDA-1088. Where should I keep my medicine? This drug is given in a hospital or clinic and will not be stored at home. NOTE: This sheet is a summary. It may not cover all possible information. If you have questions about this medicine, talk to your doctor, pharmacist, or health care provider.    2016, [employer] Standard. (2014-10-11 22:30:56)  

## 2016-02-22 NOTE — Progress Notes (Signed)
[place] [facility] day:  02/22/2016  Chief Complaint: Joan Hull is a 82 y.o. female with lymphoma who is seen for review of interval lymph node biopsy and discussion regarding direction of therapy.  HPI:  The patient was last seen in the medical oncology clinic on 01/23/2016.  At that time, she was seen for initial consultation.  Left axillary lymph node biopsy was highly suspicious for lymphoma.  Excision biopsy was recommended.  PET scan on 01/31/2016 revealed active lymphoma within the chest, abdomen, and pelvis.  The right upper lobe hypermetabolic pulmonary nodule was likely indicative of lymphoma.  The left base hypermetabolic airspace disease was indeterminate.  There was isolated small mildly hypermetabolic right cervical node, technically indeterminate but suspicious for lymphomatous.  There was focal small bowel hypermetabolism suspicious for lymphoma.  There was subtle soft tissue effacement of the interpolar left renal sinus fat. Although this could represent a duplicated collecting system, lymphomatous involvement in this region cannot be excluded. There was suspicion of lymphomatous involvement of the left humeral shaft.  There was osteopenia and thoracic compression deformities.  Lymph node excisional biopsy on 02/06/2016 revealed mature B-cell lymphoma with plasmacytic differentiation, best classified as a marginal zone lymphoma.  Symptomatically, she notes occasional sweats.  She denies any fever.  Weight is stable.   Past Medical History  Diagnosis Date  . Anxiety   . Depression   . Hyperlipidemia   . Arthritis   . GERD (gastroesophageal reflux disease)   . CAD (coronary artery disease)   . Interstitial cystitis   . Hx of colonic polyps   . Atrial fibrillation ([place])   . Non Q wave myocardial infarction ([place]) 06/27/11    [facility]  . Osteoporosis   . Dysrhythmia   . Hypertension   . Presence of permanent cardiac pacemaker   .  Heart murmur   . Shortness of breath dyspnea     doe  . Cancer [facility]) 2017    lymphoma    Past Surgical History  Procedure Laterality Date  . Cataract extraction Right 2005  . Coronary stent placement  7/12    RCA with drug eluting stent---[name]  . Coronary artery bypass graft  1/13    [facility]  . Pacemaker insertion  5/13    [facility]  . Colonoscopy  2007    [name]  . Eye surgery    . Cardiac catheterization    . Coronary angioplasty    . Insert / replace / remove pacemaker    . Axillary lymph node biopsy Left 02/06/2016    Procedure: AXILLARY LYMPH NODE BIOPSY;  Surgeon: [name], MD;  Location: [facility] ORS;  Service: General;  Laterality: Left;    Family History  Problem Relation Age of Onset  . Heart disease Mother     cad  . Hypertension Mother   . Heart disease Father     heart attack  . Heart disease Sister     half sister MI  . Heart disease Brother     cabg  . Heart disease Brother   . Cancer Brother     BLADDER  . Cancer Sister     BRAIN  . Cancer Sister 45    OVARIAN    Social History:  reports that she quit smoking about 53 years ago. She has never used smokeless tobacco. She reports that she does not drink alcohol or use illicit drugs.  She smoked when she was young (  quit in 1964).  She lives alone in [place].  Her son lives in [place], [place]. The patient is accompanied by her sister, Joan Hull, today.  Allergies:  Allergies  Allergen Reactions  . Aspirin Other (See Comments)    Bladder spasm  . Diazepam     REACTION: worsened bladder problems    Current Medications: Current Outpatient Prescriptions  Medication Sig Dispense Refill  . atenolol (TENORMIN) 25 MG tablet Take 1 tablet by mouth daily.    Marland Kitchen diltiazem (CARDIZEM CD) 180 MG 24 hr capsule Take 180 mg by mouth daily.   5  . ELIQUIS 2.5 MG TABS tablet Take 2.5 mg by mouth 2 (two) times daily.     . famotidine (PEPCID) 10 MG tablet Take 10 mg by mouth daily.    .  metoprolol (LOPRESSOR) 50 MG tablet TAKE ONE-HALF TABLET BY MOUTH TWICE DAILY 90 tablet 3  . Multiple Vitamins-Minerals (CENTRUM SILVER 50+WOMEN PO) Take 1 tablet by mouth.    Marland Kitchen PARoxetine (PAXIL) 10 MG tablet Take 1 tablet (10 mg total) by mouth daily. 90 tablet 3  . Probiotic Product (PROBIOTIC DAILY PO) Take 1 capsule by mouth daily.     Marland Kitchen torsemide (DEMADEX) 20 MG tablet TAKE ONE-HALF TABLET BY MOUTH ONCE DAILY 45 tablet 0  . vitamin B-12 (CYANOCOBALAMIN) 100 MCG tablet Take 100 mcg by mouth daily.    Marland Kitchen zolpidem (AMBIEN) 5 MG tablet TAKE ONE TABLET BY MOUTH AT BEDTIME AS NEEDED (Patient taking differently: TAKE ONE TABLET BY MOUTH AT BEDTIME) 90 tablet 0  . allopurinol (ZYLOPRIM) 300 MG tablet Take 1 tablet (300 mg total) by mouth daily. 30 tablet 1   No current facility-administered medications for this visit.    Review of Systems:  GENERAL:  Feels good. No fevers.  Occasional non-drenching night sweats.  Weight stable. PERFORMANCE STATUS (ECOG):  1 HEENT:  No visual changes, runny nose, sore throat, mouth sores or tenderness. Lungs: Shortness of breath with exertion.  No cough.  No hemoptysis. Cardiac:  Atrial fibrillation.  No chest pain, palpitations, orthopnea, or PND.  Sees cardiologist tomorrow. GI:  Bloating.  No nausea, vomiting, diarrhea, constipation, melena or hematochezia. GU:  No urgency, frequency, dysuria, or hematuria. Musculoskeletal:  No back pain.  No joint pain.  No muscle tenderness. Extremities:  Hands stiff secondary to arthritis.  No pain or swelling. Skin:  No rashes or skin changes. Neuro:  No headache, numbness or weakness, balance or coordination issues. Endocrine:  No diabetes, thyroid issues, hot flashes or night sweats. Psych:  No mood changes, depression or anxiety. Pain:  No focal pain. Review of systems:  All other systems reviewed and found to be negative.  Physical Exam: Blood pressure 147/77, pulse 65, temperature 97.2 F (36.2 C), temperature  source Oral, height '4\' 10"'$  (1.473 m), weight 102 lb 10 oz (46.55 kg). GENERAL:  Well developed, well nourished, sitting comfortably in the exam room in no acute distress. MENTAL STATUS:  Alert and oriented to person, place and time. HEAD:  Dark brown styled hair.  Normocephalic, atraumatic, face symmetric, no Cushingoid features. EYES:  Glasses.  Blue eyes s/p cataract surgery on the right.  Pupils equal round and reactive to light and accomodation.  No conjunctivitis or scleral icterus. ENT:  Oropharynx clear without lesion.  Tongue normal. Mucous membranes moist.  RESPIRATORY:  Clear to auscultation without rales, wheezes or rhonchi. CARDIOVASCULAR:  Irregular rhythm without murmur, rub or gallop. CHEST:  Well healed midline sternotomy incision.  Pacemaker.  ABDOMEN:  Soft, non-tender, with active bowel sounds, and no hepatosplenomegaly.  No masses. SKIN:  No rashes, ulcers or lesions. EXTREMITIES: Arthritis changes in hands.  Left ganglion cyst.  No edema, no skin discoloration or tenderness.  No palpable cords. LYMPH NODES: Tiny right axillary node.  Well healing left axillary incision.  No palpable cervical, supraclavicular, or inguinal adenopathy  NEUROLOGICAL: Unremarkable. PSYCH:  Appropriate.  No visits with results within 3 Day(s) from this visit. Latest known visit with results is:  Admission on 02/06/2016, Discharged on 02/06/2016  Component Date Value Ref Range Status  . SURGICAL PATHOLOGY 02/06/2016    Final                   Value:Surgical Pathology CASE: [id number] PATIENT: [facility] Surgical Pathology Report     SPECIMEN SUBMITTED: A. Lymph node, left axillary  CLINICAL HISTORY: None provided  PRE-OPERATIVE DIAGNOSIS: Left lymphoma  POST-OPERATIVE DIAGNOSIS: Same as pre-op     DIAGNOSIS: A. LYMPH NODE, LEFT AXILLARY; EXCISION: - MATURE B-CELL LYMPHOMA WITH PLASMACYTIC DIFFERENTIATION, BEST CLASSIFIED AS MARGINAL ZONE B-CELL LYMPHOMA (SEE  COMMENT).  Comment: The case was sent to [facility] for external consultation (Ref, [phone]) and the findings are incorporated above.  This is the microscopic description/comment of the external consultant Dr. Rosilyn Mings:  Histologic sections reveal mature B-cell lymphoma that exhibits a diffuse growth pattern. The lymphoma is comprised of pleomorphic lymphocytes, mostly small to intermediate-sized cells and occasional larger cells that possess moderate amount of cytoplasm and exhibit round to oval and sl                         ightly irregular nuclei with mature chromatin and small to inconspicuous nucleoli. In addition, there are many plasma cells within this infiltrate. Immunostains for kappa and lambda light chains reveal that the majority of the plasma cells are lambda light chain restricted. Focal residual follicles, some with small germinal centers are noted. Immunostains reveal that the neoplastic cells are positive for CD20 and BCL-2, and are essentially negative for CD3, CD5, CD10, and EBER. There is no overt coexpression of CD43 on B cells. The residual germinal center cells are positive for BCL 6 and CD10. Cyclin D1 is essentially negative on lymphocytes. A Ki-67 immunostain highlights numerous cells (55%), suggesting an intermediate grade behavior of this lymphoma. Concurrent flow cytometric immunophenotypic analysis reveals a monoclonal B-cell population with a nonspecific immunophenotype (CD5 negative, CD10 negative). The overall features are indicative of mature B-cell lymphoma w                         ith plasmacytic differentiation that is best classified as marginal zone B-cell lymphoma. Clinical correlation is required. Representative slides from this case were reviewed by the pathologists for quality assurance.   GROSS DESCRIPTION:  A. Labeled: left axillary lymph node  Tissue fragment(s): 1  Size: 4.3 x 3.5 x 2.5  cm  Description: fresh lymph node for lymphoma workup, lymph node bisected and touch prep prepared with Diff Quik stain slide, following touch preparation per Dr. Delorse Lek send a portion in RPMI media for flow cytometry and formalin fix the remaining tissue  Representative submitted in 1-4 cassette(s).    Final Diagnosis performed by Delorse Lek, MD.  Electronically signed 02/15/2016 9:11:09AM    The electronic signature indicates that the named Attending Pathologist has evaluated the specimen  Technical component performed at [place], [place]  [address], [place], [place] [address] Lab: [phone] Dir: Darrick Penna. Evette Doffing, MD  Professional co                         mponent performed at [facility], [facility], [place], [place], [place] [address] Lab: [phone] Dir: Dellia Nims. Reuel Derby, MD      Assessment:  MEILYN HEINDL is a 82 y.o. female with a stage III marginal zone lymphoma.  She presented with a left axillary mass.  Biopsy on 01/15/2016 revealed an atypical lymphoid proliferation highly suspicious for non-Hodgkin's lymphoma.  There were diffuse sheets of B-cells (CD20 and CD79a +; CD5 and CD10-).  In addition, there was CD138 + abundant plasmacytoid cells with lambda light chain restriction. The lambda staining was apparently present in lymphoid cells.   Lymph node excisional biopsy on 02/06/2016 revealed mature B-cell lymphoma with plasmacytic differentiation, best classified as a marginal zone lymphoma.  PET scan on 01/31/2016 revealed active lymphoma within the chest, abdomen, and pelvis.  The largest lymph node was 1.7 cm.  Hepatitis B core antibody IgM, hepatitis B surface antigen, and hepatitis C antibody were negative on 01/21/2016.  Symptomatically, she has occasional non-drenching night sweats.  Weight is stable.    Plan: 1.  Discuss pathology and PET scan.  Discuss low grade lymphoma and treatment with Rituxan weekly x 4 then likely  maintenance Rituxan every 12 weeks for 2 years.  Discuss possible tumor lysis syndrome.  Discuss importance of hydration and allopurinol.  Discuss no plan for bone marrow aspirate and biopsy as would not change therapy (unlike high grade lymphoma).  Discuss checking labs today to complete check for hepatitis B prior to Rituxan (risk of reactivation if hepatitis B positive).  Multiple questions asked by patient and her sister.  2.  Labs today:  CBC with diff, BMP, uric acid, hepatitis B core antibody total, G6PD assay. 3.  Rx:  allopurinol 300 mg a day. 4.  Preauth Rituxan. 5.  Chemotherapy class on 02/26/2016. 6.  RTC on 03/04/2016 for MD assessment, labs (CBC with diff, CMP, LDH, uric acid), and week #1 Rituxan.   Lequita Asal, MD  02/22/2016, 11:15 AM

## 2016-02-22 NOTE — Progress Notes (Signed)
Patient here for results. Patient states she feels "great". She is still experiencing episodes of fatigue and shortness of breath. She is also complaining of severe intermittent  bloating.

## 2016-02-24 LAB — GLUCOSE 6 PHOSPHATE DEHYDROGENASE
G6PDH: 9.3 U/g{Hb} (ref 4.6–13.5)
Hemoglobin: 13.3 g/dL (ref 11.1–15.9)

## 2016-02-24 LAB — HEPATITIS B CORE ANTIBODY, TOTAL: Hep B Core Total Ab: NEGATIVE

## 2016-02-25 ENCOUNTER — Telehealth: Payer: Self-pay | Admitting: Hematology and Oncology

## 2016-02-25 ENCOUNTER — Telehealth: Payer: Self-pay

## 2016-02-25 ENCOUNTER — Other Ambulatory Visit: Payer: PPO

## 2016-02-25 NOTE — Telephone Encounter (Signed)
Re:  Allopurinol  The patient was instructed to begin taking allopurinol as discussed in clinic.  She started allopurinol today.   Lequita Asal, MD

## 2016-02-25 NOTE — Telephone Encounter (Signed)
Patient called asking whether to start taking allopurinol prescription now or to wait for Rituxan administration on 7/18.

## 2016-02-25 NOTE — Patient Instructions (Signed)
Rituximab injection What is this medicine? RITUXIMAB (ri TUX i mab) is a monoclonal antibody. It is used commonly to treat non-Hodgkin lymphoma and other conditions. It is also used to treat rheumatoid arthritis (RA). In RA, this medicine slows the inflammatory process and help reduce joint pain and swelling. This medicine is often used with other cancer or arthritis medications. This medicine may be used for other purposes; ask your health care provider or pharmacist if you have questions. What should I tell my health care provider before I take this medicine? They need to know if you have any of these conditions: -blood disorders -heart disease -history of hepatitis B -infection (especially a virus infection such as chickenpox, cold sores, or herpes) -irregular heartbeat -kidney disease -lung or breathing disease, like asthma -lupus -an unusual or allergic reaction to rituximab, mouse proteins, other medicines, foods, dyes, or preservatives -pregnant or trying to get pregnant -breast-feeding How should I use this medicine? This medicine is for infusion into a vein. It is administered in a hospital or clinic by a specially trained health care professional. A special MedGuide will be given to you by the pharmacist with each prescription and refill. Be sure to read this information carefully each time. Talk to your pediatrician regarding the use of this medicine in children. This medicine is not approved for use in children. Overdosage: If you think you have taken too much of this medicine contact a poison control center or emergency room at once. NOTE: This medicine is only for you. Do not share this medicine with others. What if I miss a dose? It is important not to miss a dose. Call your doctor or health care professional if you are unable to keep an appointment. What may interact with this medicine? -cisplatin -medicines for blood pressure -some other medicines for  arthritis -vaccines This list may not describe all possible interactions. Give your health care provider a list of all the medicines, herbs, non-prescription drugs, or dietary supplements you use. Also tell them if you smoke, drink alcohol, or use illegal drugs. Some items may interact with your medicine. What should I watch for while using this medicine? Report any side effects that you notice during your treatment right away, such as changes in your breathing, fever, chills, dizziness or lightheadedness. These effects are more common with the first dose. Visit your prescriber or health care professional for checks on your progress. You will need to have regular blood work. Report any other side effects. The side effects of this medicine can continue after you finish your treatment. Continue your course of treatment even though you feel ill unless your doctor tells you to stop. Call your doctor or health care professional for advice if you get a fever, chills or sore throat, or other symptoms of a cold or flu. Do not treat yourself. This drug decreases your body's ability to fight infections. Try to avoid being around people who are sick. This medicine may increase your risk to bruise or bleed. Call your doctor or health care professional if you notice any unusual bleeding. Be careful brushing and flossing your teeth or using a toothpick because you may get an infection or bleed more easily. If you have any dental work done, tell your dentist you are receiving this medicine. Avoid taking products that contain aspirin, acetaminophen, ibuprofen, naproxen, or ketoprofen unless instructed by your doctor. These medicines may hide a fever. Do not become pregnant while taking this medicine. Women should inform their doctor if   they wish to become pregnant or think they might be pregnant. There is a potential for serious side effects to an unborn child. Talk to your health care professional or pharmacist for more  information. Do not breast-feed an infant while taking this medicine. What side effects may I notice from receiving this medicine? Side effects that you should report to your doctor or health care professional as soon as possible: -allergic reactions like skin rash, itching or hives, swelling of the face, lips, or tongue -low blood counts - this medicine may decrease the number of white blood cells, red blood cells and platelets. You may be at increased risk for infections and bleeding. -signs of infection - fever or chills, cough, sore throat, pain or difficulty passing urine -signs of decreased platelets or bleeding - bruising, pinpoint red spots on the skin, black, tarry stools, blood in the urine -signs of decreased red blood cells - unusually weak or tired, fainting spells, lightheadedness -breathing problems -confused, not responsive -chest pain -fast, irregular heartbeat -feeling faint or lightheaded, falls -mouth sores -redness, blistering, peeling or loosening of the skin, including inside the mouth -stomach pain -swelling of the ankles, feet, or hands -trouble passing urine or change in the amount of urine Side effects that usually do not require medical attention (report to your doctor or other health care professional if they continue or are bothersome): -anxiety -headache -loss of appetite -muscle aches -nausea -night sweats This list may not describe all possible side effects. Call your doctor for medical advice about side effects. You may report side effects to FDA at 1-800-FDA-1088. Where should I keep my medicine? This drug is given in a hospital or clinic and will not be stored at home. NOTE: This sheet is a summary. It may not cover all possible information. If you have questions about this medicine, talk to your doctor, pharmacist, or health care provider.    2016, [employer] Standard. (2014-10-11 22:30:56)  

## 2016-02-26 ENCOUNTER — Inpatient Hospital Stay: Payer: PPO

## 2016-02-26 LAB — SURGICAL PATHOLOGY

## 2016-02-28 ENCOUNTER — Ambulatory Visit (INDEPENDENT_AMBULATORY_CARE_PROVIDER_SITE_OTHER): Payer: PPO | Admitting: *Deleted

## 2016-02-28 DIAGNOSIS — C8590 Non-Hodgkin lymphoma, unspecified, unspecified site: Secondary | ICD-10-CM

## 2016-02-28 MED ORDER — SULFAMETHOXAZOLE-TRIMETHOPRIM 800-160 MG PO TABS: 1.0000 | ORAL_TABLET | Freq: Two times a day (BID) | ORAL | Status: DC

## 2016-02-28 NOTE — Progress Notes (Signed)
Patient came in today for a wound check.  The wound is clean, with slight thickness and redness noted. No fluctuance and no drainage. Bactrim ordered per Dr Collene Schlichter.  Follow up as scheduled.

## 2016-02-28 NOTE — Patient Instructions (Signed)
The patient is aware to call back for any questions or concerns.  

## 2016-03-03 ENCOUNTER — Other Ambulatory Visit: Payer: Self-pay | Admitting: Hematology and Oncology

## 2016-03-04 ENCOUNTER — Inpatient Hospital Stay (HOSPITAL_BASED_OUTPATIENT_CLINIC_OR_DEPARTMENT_OTHER): Payer: PPO | Admitting: Hematology and Oncology

## 2016-03-04 ENCOUNTER — Encounter: Payer: Self-pay | Admitting: Hematology and Oncology

## 2016-03-04 ENCOUNTER — Inpatient Hospital Stay: Payer: PPO

## 2016-03-04 ENCOUNTER — Ambulatory Visit
Admission: RE | Admit: 2016-03-04 | Discharge: 2016-03-04 | Disposition: A | Discharge status: Home / Self Care | Payer: PPO | Source: Ambulatory Visit | Attending: Hematology and Oncology | Admitting: Hematology and Oncology

## 2016-03-04 ENCOUNTER — Other Ambulatory Visit: Payer: Self-pay | Admitting: Internal Medicine

## 2016-03-04 VITALS — BP 144/64 | HR 59 | Temp 97.2°F | Resp 18 | Wt 101.7 lb

## 2016-03-04 DIAGNOSIS — Z5112 Encounter for antineoplastic immunotherapy: Secondary | ICD-10-CM | POA: Diagnosis not present

## 2016-03-04 DIAGNOSIS — R61 Generalized hyperhidrosis: Secondary | ICD-10-CM | POA: Diagnosis not present

## 2016-03-04 DIAGNOSIS — R0609 Other forms of dyspnea: Secondary | ICD-10-CM

## 2016-03-04 DIAGNOSIS — N179 Acute kidney failure, unspecified: Secondary | ICD-10-CM | POA: Diagnosis not present

## 2016-03-04 DIAGNOSIS — C8580 Other specified types of non-Hodgkin lymphoma, unspecified site: Secondary | ICD-10-CM

## 2016-03-04 DIAGNOSIS — C8308 Small cell B-cell lymphoma, lymph nodes of multiple sites: Secondary | ICD-10-CM

## 2016-03-04 DIAGNOSIS — Z7901 Long term (current) use of anticoagulants: Secondary | ICD-10-CM

## 2016-03-04 DIAGNOSIS — N289 Disorder of kidney and ureter, unspecified: Secondary | ICD-10-CM | POA: Insufficient documentation

## 2016-03-04 DIAGNOSIS — Z950 Presence of cardiac pacemaker: Secondary | ICD-10-CM

## 2016-03-04 DIAGNOSIS — I4891 Unspecified atrial fibrillation: Secondary | ICD-10-CM

## 2016-03-04 DIAGNOSIS — Z79899 Other long term (current) drug therapy: Secondary | ICD-10-CM

## 2016-03-04 DIAGNOSIS — E785 Hyperlipidemia, unspecified: Secondary | ICD-10-CM

## 2016-03-04 DIAGNOSIS — I252 Old myocardial infarction: Secondary | ICD-10-CM

## 2016-03-04 DIAGNOSIS — I10 Essential (primary) hypertension: Secondary | ICD-10-CM

## 2016-03-04 DIAGNOSIS — Z87891 Personal history of nicotine dependence: Secondary | ICD-10-CM

## 2016-03-04 DIAGNOSIS — I2510 Atherosclerotic heart disease of native coronary artery without angina pectoris: Secondary | ICD-10-CM

## 2016-03-04 LAB — URINALYSIS COMPLETE WITH MICROSCOPIC (ARMC ONLY)
Bacteria, UA: NONE SEEN
Bilirubin Urine: NEGATIVE
Glucose, UA: NEGATIVE mg/dL
Hgb urine dipstick: NEGATIVE
Ketones, ur: NEGATIVE mg/dL
Nitrite: NEGATIVE
Protein, ur: NEGATIVE mg/dL
Specific Gravity, Urine: 1.017 (ref 1.005–1.030)
pH: 6 (ref 5.0–8.0)

## 2016-03-04 LAB — CBC WITH DIFFERENTIAL/PLATELET
Basophils Absolute: 0 10*3/uL (ref 0–0.1)
Basophils Relative: 1 %
Eosinophils Absolute: 0.3 10*3/uL (ref 0–0.7)
Eosinophils Relative: 6 %
HCT: 38.2 % (ref 35.0–47.0)
Hemoglobin: 13.4 g/dL (ref 12.0–16.0)
Lymphocytes Relative: 19 %
Lymphs Abs: 1.1 10*3/uL (ref 1.0–3.6)
MCH: 32.3 pg (ref 26.0–34.0)
MCHC: 35 g/dL (ref 32.0–36.0)
MCV: 92.4 fL (ref 80.0–100.0)
Monocytes Absolute: 0.9 10*3/uL (ref 0.2–0.9)
Monocytes Relative: 16 %
Neutro Abs: 3.3 10*3/uL (ref 1.4–6.5)
Neutrophils Relative %: 58 %
Platelets: 232 10*3/uL (ref 150–440)
RBC: 4.13 MIL/uL (ref 3.80–5.20)
RDW: 14.2 % (ref 11.5–14.5)
WBC: 5.6 10*3/uL (ref 3.6–11.0)

## 2016-03-04 LAB — COMPREHENSIVE METABOLIC PANEL
ALT: 22 U/L (ref 14–54)
AST: 33 U/L (ref 15–41)
Albumin: 4.6 g/dL (ref 3.5–5.0)
Alkaline Phosphatase: 117 U/L (ref 38–126)
Anion gap: 9 (ref 5–15)
BUN: 33 mg/dL — ABNORMAL HIGH (ref 6–20)
CO2: 25 mmol/L (ref 22–32)
Calcium: 9.5 mg/dL (ref 8.9–10.3)
Chloride: 99 mmol/L — ABNORMAL LOW (ref 101–111)
Creatinine, Ser: 1.54 mg/dL — ABNORMAL HIGH (ref 0.44–1.00)
GFR calc Af Amer: 34 mL/min — ABNORMAL LOW (ref >60)
GFR calc non Af Amer: 29 mL/min — ABNORMAL LOW (ref >60)
Glucose, Bld: 142 mg/dL — ABNORMAL HIGH (ref 65–99)
Potassium: 4.3 mmol/L (ref 3.5–5.1)
Sodium: 133 mmol/L — ABNORMAL LOW (ref 135–145)
Total Bilirubin: 0.7 mg/dL (ref 0.3–1.2)
Total Protein: 7.3 g/dL (ref 6.5–8.1)

## 2016-03-04 LAB — URIC ACID: Uric Acid, Serum: 3.1 mg/dL (ref 2.3–6.6)

## 2016-03-04 LAB — LACTATE DEHYDROGENASE: LDH: 172 U/L (ref 98–192)

## 2016-03-04 NOTE — Progress Notes (Signed)
Patient is here for follow up, she has a lot of questions about her treatment she will be getting today. She has been on ABX since 02/29/16 given by Dr. Bary Castilla. She did not take it this morning due to unsure if she can take it plus the treatment. Her and her guest today ask the difference between non hodkins and hodkins lymphoma. Can she still take her hair,skin and nail vitamin with treatment. Also asking about bone marrow.

## 2016-03-04 NOTE — Progress Notes (Signed)
[facility] day:  03/04/2016  Chief Complaint: Joan Hull is a 82 y.o. female with lymphoma who is seen for assessment prior to week #1 Rituxan.  HPI:  The patient was last seen in the medical oncology clinic on 02/22/2016.  At that time, pathology from her axillary lymph node biopsy and PET scan were reviewed.  We discussed treatment with Rituxan.  Side effects were reviewed.  Labs included a normal BMP (Cr 0.97) and negative hepatitis B core antibody.  G6PD assay was normal.  Uric acid was 7.1 (2.3-6.6).  She was started on allopurinol 300 mg a day.    She attended the chemotherapy class.  During the interim, she was seen in follow-up at Dr. Dwyane Luo office on 02/28/2016.  Wound check revealed slight thickness and redness without fluctuance or drainage.  Bactrim was started.  Symptomatically, she notes "nothing new".  She is taking her allopurinol.  She is drinking, but not a lot of water.  She denies any B symptoms.   Past Medical History  Diagnosis Date  . Anxiety   . Depression   . Hyperlipidemia   . Arthritis   . GERD (gastroesophageal reflux disease)   . CAD (coronary artery disease)   . Interstitial cystitis   . Hx of colonic polyps   . Atrial fibrillation ([place])   . Non Q wave myocardial infarction ([place]) 06/27/11    [facility]  . Osteoporosis   . Dysrhythmia   . Hypertension   . Presence of permanent cardiac pacemaker   . Heart murmur   . Shortness of breath dyspnea     doe  . Cancer [facility]) 2017    lymphoma    Past Surgical History  Procedure Laterality Date  . Cataract extraction Right 2005  . Coronary stent placement  7/12    RCA with drug eluting stent---[name]  . Coronary artery bypass graft  1/13    [facility]  . Pacemaker insertion  5/13    [facility]  . Colonoscopy  2007    [name]  . Eye surgery    . Cardiac catheterization    . Coronary angioplasty    . Insert / replace / remove pacemaker    . Axillary  lymph node biopsy Left 02/06/2016    Procedure: AXILLARY LYMPH NODE BIOPSY;  Surgeon: [name], MD;  Location: [facility] ORS;  Service: General;  Laterality: Left;    Family History  Problem Relation Age of Onset  . Heart disease Mother     cad  . Hypertension Mother   . Heart disease Father     heart attack  . Heart disease Sister     half sister MI  . Heart disease Brother     cabg  . Heart disease Brother   . Cancer Brother     BLADDER  . Cancer Sister     BRAIN  . Cancer Sister 78    OVARIAN    Social History:  reports that she quit smoking about 53 years ago. She has never used smokeless tobacco. She reports that she does not drink alcohol or use illicit drugs.  She smoked when she was young (quit in 1964).  She lives alone in [place].  Her son lives in [place], [place]. The patient is accompanied by a woman today.  Allergies:  Allergies  Allergen Reactions  . Aspirin Other (See Comments)    Bladder spasm  . Diazepam     REACTION:  worsened bladder problems    Current Medications: Current Outpatient Prescriptions  Medication Sig Dispense Refill  . allopurinol (ZYLOPRIM) 300 MG tablet Take 1 tablet (300 mg total) by mouth daily. 30 tablet 1  . atenolol (TENORMIN) 25 MG tablet Take 1 tablet by mouth daily.    Marland Kitchen diltiazem (CARDIZEM CD) 180 MG 24 hr capsule Take 180 mg by mouth daily.   5  . ELIQUIS 2.5 MG TABS tablet Take 2.5 mg by mouth 2 (two) times daily.     . famotidine (PEPCID) 10 MG tablet Take 10 mg by mouth daily.    . metoprolol (LOPRESSOR) 50 MG tablet TAKE ONE-HALF TABLET BY MOUTH TWICE DAILY 90 tablet 3  . Multiple Vitamins-Minerals (CENTRUM SILVER 50+WOMEN PO) Take 1 tablet by mouth.    Marland Kitchen PARoxetine (PAXIL) 10 MG tablet Take 1 tablet (10 mg total) by mouth daily. 90 tablet 3  . Probiotic Product (PROBIOTIC DAILY PO) Take 1 capsule by mouth daily.     Marland Kitchen sulfamethoxazole-trimethoprim (BACTRIM DS,SEPTRA DS) 800-160 MG tablet Take 1 tablet by mouth 2  (two) times daily. 14 tablet 0  . torsemide (DEMADEX) 20 MG tablet TAKE ONE-HALF TABLET BY MOUTH ONCE DAILY 45 tablet 0  . vitamin B-12 (CYANOCOBALAMIN) 100 MCG tablet Take 100 mcg by mouth daily.    Marland Kitchen zolpidem (AMBIEN) 5 MG tablet TAKE ONE TABLET BY MOUTH AT BEDTIME AS NEEDED (Patient taking differently: TAKE ONE TABLET BY MOUTH AT BEDTIME) 90 tablet 0   No current facility-administered medications for this visit.    Review of Systems:  GENERAL:  Feels good. No fevers.  Rare non-drenching night sweats.  Weight up 1 pound. PERFORMANCE STATUS (ECOG):  1 HEENT:  No visual changes, runny nose, sore throat, mouth sores or tenderness. Lungs: Shortness of breath with exertion.  No cough.  No hemoptysis. Cardiac:  Atrial fibrillation.  No chest pain, palpitations, orthopnea, or PND.  Sees cardiologist tomorrow. GI:  Appetite 75%.  No nausea, vomiting, diarrhea, constipation, melena or hematochezia. GU:  No urgency, frequency, dysuria, or hematuria.  Drinking "as much as I can". Musculoskeletal:  No back pain.  No joint pain.  No muscle tenderness. Extremities:  Hands stiff secondary to arthritis.  No pain or swelling. Skin:  On Bactrim for infection s/p lymph node biopsy.  No rashes or skin changes. Neuro:  No headache, numbness or weakness, balance or coordination issues. Endocrine:  No diabetes, thyroid issues, hot flashes or night sweats. Psych:  No mood changes, depression or anxiety. Pain:  No focal pain. Review of systems:  All other systems reviewed and found to be negative.  Physical Exam: Blood pressure 144/64, pulse 59, temperature 97.2 F (36.2 C), temperature source Tympanic, resp. rate 18, weight 101 lb 11.9 oz (46.15 kg). GENERAL:  Well developed, well nourished, sitting comfortably in the exam room in no acute distress. MENTAL STATUS:  Alert and oriented to person, place and time. HEAD:  Dark brown styled hair.  Normocephalic, atraumatic, face symmetric, no Cushingoid  features. EYES:  Glasses.  Blue eyes s/p cataract surgery on the right.  Pupils equal round and reactive to light and accomodation.  No conjunctivitis or scleral icterus. ENT:  Oropharynx clear without lesion.  Tongue normal. Mucous membranes moist.  RESPIRATORY:  Clear to auscultation without rales, wheezes or rhonchi. CARDIOVASCULAR:  Irregular rhythm without murmur, rub or gallop. CHEST:  Well healed midline sternotomy incision.  Pacemaker. ABDOMEN:  Soft, non-tender, with active bowel sounds, and no hepatosplenomegaly.  No masses.  SKIN:  No rashes, ulcers or lesions. EXTREMITIES: Arthritis changes in hands.  Left ganglion cyst.  No edema, no skin discoloration or tenderness.  No palpable cords. LYMPH NODES: Well healing left axillary incision without erythema or increased warmth.  No palpable cervical, supraclavicular, or inguinal adenopathy  NEUROLOGICAL: Unremarkable. PSYCH:  Appropriate.   Appointment on 03/04/2016  Component Date Value Ref Range Status  . WBC 03/04/2016 5.6  3.6 - 11.0 K/uL Final  . RBC 03/04/2016 4.13  3.80 - 5.20 MIL/uL Final  . Hemoglobin 03/04/2016 13.4  12.0 - 16.0 g/dL Final  . HCT 03/04/2016 38.2  35.0 - 47.0 % Final  . MCV 03/04/2016 92.4  80.0 - 100.0 fL Final  . MCH 03/04/2016 32.3  26.0 - 34.0 pg Final  . MCHC 03/04/2016 35.0  32.0 - 36.0 g/dL Final  . RDW 03/04/2016 14.2  11.5 - 14.5 % Final  . Platelets 03/04/2016 232  150 - 440 K/uL Final  . Neutrophils Relative % 03/04/2016 58   Final  . Neutro Abs 03/04/2016 3.3  1.4 - 6.5 K/uL Final  . Lymphocytes Relative 03/04/2016 19   Final  . Lymphs Abs 03/04/2016 1.1  1.0 - 3.6 K/uL Final  . Monocytes Relative 03/04/2016 16   Final  . Monocytes Absolute 03/04/2016 0.9  0.2 - 0.9 K/uL Final  . Eosinophils Relative 03/04/2016 6   Final  . Eosinophils Absolute 03/04/2016 0.3  0 - 0.7 K/uL Final  . Basophils Relative 03/04/2016 1   Final  . Basophils Absolute 03/04/2016 0.0  0 - 0.1 K/uL Final  . Sodium  03/04/2016 133* 135 - 145 mmol/L Final  . Potassium 03/04/2016 4.3  3.5 - 5.1 mmol/L Final  . Chloride 03/04/2016 99* 101 - 111 mmol/L Final  . CO2 03/04/2016 25  22 - 32 mmol/L Final  . Glucose, Bld 03/04/2016 142* 65 - 99 mg/dL Final  . BUN 03/04/2016 33* 6 - 20 mg/dL Final  . Creatinine, Ser 03/04/2016 1.54* 0.44 - 1.00 mg/dL Final  . Calcium 03/04/2016 9.5  8.9 - 10.3 mg/dL Final  . Total Protein 03/04/2016 7.3  6.5 - 8.1 g/dL Final  . Albumin 03/04/2016 4.6  3.5 - 5.0 g/dL Final  . AST 03/04/2016 33  15 - 41 U/L Final  . ALT 03/04/2016 22  14 - 54 U/L Final  . Alkaline Phosphatase 03/04/2016 117  38 - 126 U/L Final  . Total Bilirubin 03/04/2016 0.7  0.3 - 1.2 mg/dL Final  . GFR calc non Af Amer 03/04/2016 29* >60 mL/min Final  . GFR calc Af Amer 03/04/2016 34* >60 mL/min Final   Comment: (NOTE) The eGFR has been calculated using the CKD EPI equation. This calculation has not been validated in all clinical situations. eGFR's persistently <60 mL/min signify possible Chronic Kidney Disease.   . Anion gap 03/04/2016 9  5 - 15 Final  . LDH 03/04/2016 172  98 - 192 U/L Final    Assessment:  Joan Hull is a 82 y.o. female with a stage III marginal zone lymphoma.  She presented with a left axillary mass.  Biopsy on 01/15/2016 revealed an atypical lymphoid proliferation highly suspicious for non-Hodgkin's lymphoma.  There were diffuse sheets of B-cells (CD20 and CD79a +; CD5 and CD10-).  In addition, there was CD138 + abundant plasmacytoid cells with lambda light chain restriction. The lambda staining was apparently present in lymphoid cells.   Lymph node excisional biopsy on 02/06/2016 revealed mature B-cell lymphoma with plasmacytic differentiation,  best classified as a marginal zone lymphoma.  PET scan on 01/31/2016 revealed active lymphoma within the chest, abdomen, and pelvis.  The largest lymph node was 1.7 cm.  Hepatitis B core antibody IgM, hepatitis B surface antigen, and  hepatitis C antibody were negative on 01/21/2016.  Hepatitis B core antibody was negative on 02/22/2016.  G6PD assay was negative on 02/22/2016.  She has acute renal insufficiency.  Baseline creatinine is 0.91 - 1.13 (CrCl 44 - 54 ml/min).  Creatinine is 1.54 (CrCl 29 ml/min) today.  She has been on Septra since 02/28/2016.  She denies any change in fluid intake.  Symptomatically, she denies any new complaints.  Exam reveals a well healing axillary incision.  Uric acid is 3.1.  Plan: 1.  Labs today:  CBC with diff, CMP, LDH, uric acid. 2.  Discuss treatment plan.  Side effects reviewed.  Patient consented to treatment. 3.  Postpone Rituxan secondary to new unexplained renal insufficiency. 4.  Stop Septra (make have caused renal insufficiency). 5.  Temporarily stop allopurinol (until renal insufficiency resolved as uric acid normal). 6.  Urinalysis. 7.  Renal ultrasound- r/o obstruction. 8.  Consult nephrology. 9.  Encourage fluid intake. 10.  RTC in 1 week for MD assessment, labs (CBC with diff, CMP, uric acid) and week #1 Rituxan.   Lequita Asal, MD  03/04/2016, 9:47 AM

## 2016-03-05 NOTE — Telephone Encounter (Signed)
Last filled 11-23-15 #180 Last OV Acute 01-03-16 Next OV 06-23-16

## 2016-03-05 NOTE — Telephone Encounter (Signed)
Approved 1 year

## 2016-03-06 ENCOUNTER — Telehealth: Payer: Self-pay | Admitting: *Deleted

## 2016-03-06 NOTE — Telephone Encounter (Signed)
Called pt after i took a pt to ER and  it was not 3 pm when I called her at home today.  She wanted to know the results of her labs from this week. She wanted to know the plan for her.  I told her it is the same as when she was seen in the office making sure that she is off the atb and the allopurinol and she said yes.  I told her that we have faxed info to the nephrologist and awaiting an appt for her.  She wants to know if she will have treatment next week and I told her that we will not know until she has her labs drawn on that day. She wants to know what is causing her lab to be abnl and I told her that we feel that it was caused by atb and the allopurinol but it might be something else so we will have to wait to see the labs before we will know if she gets treatment on next week.

## 2016-03-06 NOTE — Telephone Encounter (Signed)
  Please call patient. We discussed at last appt.  M

## 2016-03-06 NOTE — Telephone Encounter (Signed)
Requests a call back at 3 PM with results of her kidney tests

## 2016-03-07 ENCOUNTER — Other Ambulatory Visit: Payer: Self-pay

## 2016-03-07 DIAGNOSIS — C8580 Other specified types of non-Hodgkin lymphoma, unspecified site: Secondary | ICD-10-CM

## 2016-03-11 ENCOUNTER — Other Ambulatory Visit: Payer: Self-pay | Admitting: *Deleted

## 2016-03-11 ENCOUNTER — Inpatient Hospital Stay: Payer: PPO

## 2016-03-11 ENCOUNTER — Inpatient Hospital Stay (HOSPITAL_BASED_OUTPATIENT_CLINIC_OR_DEPARTMENT_OTHER): Payer: PPO | Admitting: Hematology and Oncology

## 2016-03-11 ENCOUNTER — Other Ambulatory Visit: Payer: Self-pay | Admitting: Hematology and Oncology

## 2016-03-11 VITALS — BP 133/70 | HR 58 | Resp 20

## 2016-03-11 DIAGNOSIS — E785 Hyperlipidemia, unspecified: Secondary | ICD-10-CM

## 2016-03-11 DIAGNOSIS — C8580 Other specified types of non-Hodgkin lymphoma, unspecified site: Secondary | ICD-10-CM

## 2016-03-11 DIAGNOSIS — I2510 Atherosclerotic heart disease of native coronary artery without angina pectoris: Secondary | ICD-10-CM

## 2016-03-11 DIAGNOSIS — Z950 Presence of cardiac pacemaker: Secondary | ICD-10-CM

## 2016-03-11 DIAGNOSIS — Z5112 Encounter for antineoplastic immunotherapy: Secondary | ICD-10-CM | POA: Diagnosis not present

## 2016-03-11 DIAGNOSIS — C8308 Small cell B-cell lymphoma, lymph nodes of multiple sites: Secondary | ICD-10-CM | POA: Diagnosis not present

## 2016-03-11 DIAGNOSIS — Z7901 Long term (current) use of anticoagulants: Secondary | ICD-10-CM

## 2016-03-11 DIAGNOSIS — I10 Essential (primary) hypertension: Secondary | ICD-10-CM

## 2016-03-11 DIAGNOSIS — N289 Disorder of kidney and ureter, unspecified: Secondary | ICD-10-CM

## 2016-03-11 DIAGNOSIS — R61 Generalized hyperhidrosis: Secondary | ICD-10-CM | POA: Diagnosis not present

## 2016-03-11 DIAGNOSIS — Z79899 Other long term (current) drug therapy: Secondary | ICD-10-CM

## 2016-03-11 DIAGNOSIS — R0609 Other forms of dyspnea: Secondary | ICD-10-CM

## 2016-03-11 DIAGNOSIS — K219 Gastro-esophageal reflux disease without esophagitis: Secondary | ICD-10-CM

## 2016-03-11 DIAGNOSIS — Z87891 Personal history of nicotine dependence: Secondary | ICD-10-CM

## 2016-03-11 DIAGNOSIS — I252 Old myocardial infarction: Secondary | ICD-10-CM

## 2016-03-11 DIAGNOSIS — M1990 Unspecified osteoarthritis, unspecified site: Secondary | ICD-10-CM

## 2016-03-11 DIAGNOSIS — I4891 Unspecified atrial fibrillation: Secondary | ICD-10-CM

## 2016-03-11 LAB — COMPREHENSIVE METABOLIC PANEL
ALT: 17 U/L (ref 14–54)
AST: 24 U/L (ref 15–41)
Albumin: 4.5 g/dL (ref 3.5–5.0)
Alkaline Phosphatase: 114 U/L (ref 38–126)
Anion gap: 11 (ref 5–15)
BUN: 30 mg/dL — ABNORMAL HIGH (ref 6–20)
CO2: 29 mmol/L (ref 22–32)
Calcium: 9.6 mg/dL (ref 8.9–10.3)
Chloride: 98 mmol/L — ABNORMAL LOW (ref 101–111)
Creatinine, Ser: 1.16 mg/dL — ABNORMAL HIGH (ref 0.44–1.00)
GFR calc Af Amer: 48 mL/min — ABNORMAL LOW (ref >60)
GFR calc non Af Amer: 41 mL/min — ABNORMAL LOW (ref >60)
Glucose, Bld: 103 mg/dL — ABNORMAL HIGH (ref 65–99)
Potassium: 3.8 mmol/L (ref 3.5–5.1)
Sodium: 138 mmol/L (ref 135–145)
Total Bilirubin: 0.7 mg/dL (ref 0.3–1.2)
Total Protein: 7.4 g/dL (ref 6.5–8.1)

## 2016-03-11 LAB — URIC ACID: Uric Acid, Serum: 7.6 mg/dL — ABNORMAL HIGH (ref 2.3–6.6)

## 2016-03-11 LAB — CBC WITH DIFFERENTIAL/PLATELET
Basophils Absolute: 0 10*3/uL (ref 0–0.1)
Basophils Relative: 0 %
Eosinophils Absolute: 0.2 10*3/uL (ref 0–0.7)
Eosinophils Relative: 5 %
HCT: 39.5 % (ref 35.0–47.0)
Hemoglobin: 13.9 g/dL (ref 12.0–16.0)
Lymphocytes Relative: 19 %
Lymphs Abs: 1 10*3/uL (ref 1.0–3.6)
MCH: 32.3 pg (ref 26.0–34.0)
MCHC: 35.2 g/dL (ref 32.0–36.0)
MCV: 91.5 fL (ref 80.0–100.0)
Monocytes Absolute: 1 10*3/uL — ABNORMAL HIGH (ref 0.2–0.9)
Monocytes Relative: 19 %
Neutro Abs: 3 10*3/uL (ref 1.4–6.5)
Neutrophils Relative %: 57 %
Platelets: 222 10*3/uL (ref 150–440)
RBC: 4.32 MIL/uL (ref 3.80–5.20)
RDW: 14.4 % (ref 11.5–14.5)
WBC: 5.3 10*3/uL (ref 3.6–11.0)

## 2016-03-11 MED ORDER — ACETAMINOPHEN 325 MG PO TABS
650.0000 mg | ORAL_TABLET | Freq: Once | ORAL | Status: AC
  Administered 2016-03-11: 650 mg via ORAL
  Filled 2016-03-11: qty 2

## 2016-03-11 MED ORDER — DIPHENHYDRAMINE HCL 25 MG PO CAPS
25.0000 mg | ORAL_CAPSULE | Freq: Once | ORAL | Status: AC
  Administered 2016-03-11: 25 mg via ORAL
  Filled 2016-03-11: qty 1

## 2016-03-11 MED ORDER — SODIUM CHLORIDE 0.9 % IV SOLN
375.0000 mg/m2 | Freq: Once | INTRAVENOUS | Status: AC
  Administered 2016-03-11: 500 mg via INTRAVENOUS
  Filled 2016-03-11: qty 50

## 2016-03-11 MED ORDER — SODIUM CHLORIDE 0.9 % IV SOLN
Freq: Once | INTRAVENOUS | Status: AC
  Administered 2016-03-11: 11:00:00 via INTRAVENOUS
  Filled 2016-03-11: qty 1000

## 2016-03-11 NOTE — Progress Notes (Signed)
Patient is here for follow up, she mentions some bloating after she eats.  Other than that no complaints

## 2016-03-11 NOTE — Progress Notes (Signed)
[place] [facility] day:  03/11/16  Chief Complaint: Joan Hull is a 82 y.o. female with lymphoma who is seen for assessment prior to week #1 Rituxan.  HPI:  The patient was last seen in the medical oncology clinic on 03/04/2016.  At that time, initiation of Rituxan was cancelled secondary to unexplained increased creatinine.  Her Septra and allopurinol were held.  Renal ultrasound on 03/04/2016 revealed no evidence of hydronephrosis.  She was felt to have an increased creatinine secondary to Septra.  Symptomatically, she feels good.  She has had no flare up of the erythema under her left arm off Septra.  She denies any B symptoms.   Past Medical History:  Diagnosis Date  . Anxiety   . Arthritis   . Atrial fibrillation ([place])   . CAD (coronary artery disease)   . Cancer [facility]) 2017   lymphoma  . Depression   . Dysrhythmia   . GERD (gastroesophageal reflux disease)   . Heart murmur   . Hx of colonic polyps   . Hyperlipidemia   . Hypertension   . Interstitial cystitis   . Non Q wave myocardial infarction ([place]) 06/27/11   [facility]  . Osteoporosis   . Presence of permanent cardiac pacemaker   . Shortness of breath dyspnea    doe    Past Surgical History:  Procedure Laterality Date  . AXILLARY LYMPH NODE BIOPSY Left 02/06/2016   Procedure: AXILLARY LYMPH NODE BIOPSY;  Surgeon: [name], MD;  Location: [facility] ORS;  Service: General;  Laterality: Left;  . CARDIAC CATHETERIZATION    . CATARACT EXTRACTION Right 2005  . COLONOSCOPY  2007   [name]  . CORONARY ANGIOPLASTY    . CORONARY ARTERY BYPASS GRAFT  1/13   [facility]  . CORONARY STENT PLACEMENT  7/12   RCA with drug eluting stent---[name]  . EYE SURGERY    . INSERT / REPLACE / REMOVE PACEMAKER    . PACEMAKER INSERTION  5/13   [facility]    Family History  Problem Relation Age of Onset  . Heart disease Mother     cad  . Hypertension Mother   . Heart disease Father    heart attack  . Heart disease Sister     half sister MI  . Heart disease Brother     cabg  . Heart disease Brother   . Cancer Brother     BLADDER  . Cancer Sister     BRAIN  . Cancer Sister 81    OVARIAN    Social History:  reports that she quit smoking about 53 years ago. She has never used smokeless tobacco. She reports that she does not drink alcohol or use drugs.  She smoked when she was young (quit in 1964).  She lives alone in [place].  Her son lives in [place], [place]. The patient is alone today.  Allergies:  Allergies  Allergen Reactions  . Aspirin Other (See Comments)    Bladder spasm  . Diazepam     REACTION: worsened bladder problems  . Chlorhexidine Rash    Skin reacts only to scrub    Current Medications: Current Outpatient Prescriptions  Medication Sig Dispense Refill  . atenolol (TENORMIN) 25 MG tablet Take 1 tablet by mouth daily.    Marland Kitchen diltiazem (CARDIZEM CD) 180 MG 24 hr capsule Take 180 mg by mouth daily.   5  . ELIQUIS 2.5 MG TABS tablet Take 2.5 mg by  mouth 2 (two) times daily.     Marland Kitchen ELIQUIS 2.5 MG TABS tablet TAKE ONE TABLET BY MOUTH TWICE DAILY 180 tablet 3  . famotidine (PEPCID) 10 MG tablet Take 10 mg by mouth daily.    . metoprolol (LOPRESSOR) 50 MG tablet TAKE ONE-HALF TABLET BY MOUTH TWICE DAILY 90 tablet 3  . Multiple Vitamins-Minerals (CENTRUM SILVER 50+WOMEN PO) Take 1 tablet by mouth.    Marland Kitchen PARoxetine (PAXIL) 10 MG tablet Take 1 tablet (10 mg total) by mouth daily. 90 tablet 3  . Probiotic Product (PROBIOTIC DAILY PO) Take 1 capsule by mouth daily.     Marland Kitchen torsemide (DEMADEX) 20 MG tablet TAKE ONE-HALF TABLET BY MOUTH ONCE DAILY 45 tablet 0  . vitamin B-12 (CYANOCOBALAMIN) 100 MCG tablet Take 100 mcg by mouth daily.    Marland Kitchen zolpidem (AMBIEN) 5 MG tablet TAKE ONE TABLET BY MOUTH AT BEDTIME AS NEEDED (Patient taking differently: TAKE ONE TABLET BY MOUTH AT BEDTIME) 90 tablet 0  . allopurinol (ZYLOPRIM) 300 MG tablet Take 1 tablet (300 mg total)  by mouth daily. (Patient not taking: Reported on 03/11/2016) 30 tablet 1  . sulfamethoxazole-trimethoprim (BACTRIM DS,SEPTRA DS) 800-160 MG tablet Take 1 tablet by mouth 2 (two) times daily. (Patient not taking: Reported on 03/11/2016) 14 tablet 0   No current facility-administered medications for this visit.     Review of Systems:  GENERAL:  Feels good. No fevers.  Rare non-drenching night sweats.  Weight stable. PERFORMANCE STATUS (ECOG):  1 HEENT:  No visual changes, runny nose, sore throat, mouth sores or tenderness. Lungs: Shortness of breath with exertion.  No cough.  No hemoptysis. Cardiac:  Atrial fibrillation.  No chest pain, palpitations, orthopnea, or PND.  Sees cardiologist tomorrow. GI:  Appetite good.  Bloating after eating.  No nausea, vomiting, diarrhea, constipation, melena or hematochezia. GU:  No urgency, frequency, dysuria, or hematuria. Musculoskeletal:  No back pain.  No joint pain.  No muscle tenderness. Extremities:  Hands stiff secondary to arthritis.  No pain or swelling. Skin:  No rashes or skin changes. Neuro:  No headache, numbness or weakness, balance or coordination issues. Endocrine:  No diabetes, thyroid issues, hot flashes or night sweats. Psych:  No mood changes, depression or anxiety. Pain:  No focal pain. Review of systems:  All other systems reviewed and found to be negative.  Physical Exam: Blood pressure (!) 150/69, pulse 60, temperature 97 F (36.1 C), temperature source Tympanic, resp. rate 18, weight 101 lb 10.1 oz (46.1 kg). GENERAL:  Well developed, well nourished, sitting comfortably in the exam room in no acute distress. MENTAL STATUS:  Alert and oriented to person, place and time. HEAD:  Dark brown styled hair.  Normocephalic, atraumatic, face symmetric, no Cushingoid features. EYES:  Glasses.  Blue eyes s/p cataract surgery on the right.  Pupils equal round and reactive to light and accomodation.  No conjunctivitis or scleral icterus. ENT:   Oropharynx clear without lesion.  Tongue normal. Mucous membranes moist.  RESPIRATORY:  Clear to auscultation without rales, wheezes or rhonchi. CARDIOVASCULAR:  Irregular rhythm without murmur, rub or gallop. CHEST:  Well healed midline sternotomy incision.  Pacemaker. ABDOMEN:  Soft, non-tender, with active bowel sounds, and no hepatosplenomegaly.  No masses. SKIN:  No rashes, ulcers or lesions. EXTREMITIES: Arthritis changes in hands.  Left ganglion cyst.  No edema, no skin discoloration or tenderness.  No palpable cords. LYMPH NODES: Well healed left axillary incision without erythema or increased warmth.  No palpable cervical,  supraclavicular, or inguinal adenopathy  NEUROLOGICAL: Unremarkable. PSYCH:  Appropriate.   Appointment on 03/11/2016  Component Date Value Ref Range Status  . WBC 03/11/2016 5.3  3.6 - 11.0 K/uL Final  . RBC 03/11/2016 4.32  3.80 - 5.20 MIL/uL Final  . Hemoglobin 03/11/2016 13.9  12.0 - 16.0 g/dL Final  . HCT 03/11/2016 39.5  35.0 - 47.0 % Final  . MCV 03/11/2016 91.5  80.0 - 100.0 fL Final  . MCH 03/11/2016 32.3  26.0 - 34.0 pg Final  . MCHC 03/11/2016 35.2  32.0 - 36.0 g/dL Final  . RDW 03/11/2016 14.4  11.5 - 14.5 % Final  . Platelets 03/11/2016 222  150 - 440 K/uL Final  . Neutrophils Relative % 03/11/2016 57  % Final  . Neutro Abs 03/11/2016 3.0  1.4 - 6.5 K/uL Final  . Lymphocytes Relative 03/11/2016 19  % Final  . Lymphs Abs 03/11/2016 1.0  1.0 - 3.6 K/uL Final  . Monocytes Relative 03/11/2016 19  % Final  . Monocytes Absolute 03/11/2016 1.0* 0.2 - 0.9 K/uL Final  . Eosinophils Relative 03/11/2016 5  % Final  . Eosinophils Absolute 03/11/2016 0.2  0 - 0.7 K/uL Final  . Basophils Relative 03/11/2016 0  % Final  . Basophils Absolute 03/11/2016 0.0  0 - 0.1 K/uL Final  . Sodium 03/11/2016 138  135 - 145 mmol/L Final  . Potassium 03/11/2016 3.8  3.5 - 5.1 mmol/L Final  . Chloride 03/11/2016 98* 101 - 111 mmol/L Final  . CO2 03/11/2016 29  22 - 32  mmol/L Final  . Glucose, Bld 03/11/2016 103* 65 - 99 mg/dL Final  . BUN 03/11/2016 30* 6 - 20 mg/dL Final  . Creatinine, Ser 03/11/2016 1.16* 0.44 - 1.00 mg/dL Final  . Calcium 03/11/2016 9.6  8.9 - 10.3 mg/dL Final  . Total Protein 03/11/2016 7.4  6.5 - 8.1 g/dL Final  . Albumin 03/11/2016 4.5  3.5 - 5.0 g/dL Final  . AST 03/11/2016 24  15 - 41 U/L Final  . ALT 03/11/2016 17  14 - 54 U/L Final  . Alkaline Phosphatase 03/11/2016 114  38 - 126 U/L Final  . Total Bilirubin 03/11/2016 0.7  0.3 - 1.2 mg/dL Final  . GFR calc non Af Amer 03/11/2016 41* >60 mL/min Final  . GFR calc Af Amer 03/11/2016 48* >60 mL/min Final   Comment: (NOTE) The eGFR has been calculated using the CKD EPI equation. This calculation has not been validated in all clinical situations. eGFR's persistently <60 mL/min signify possible Chronic Kidney Disease.   . Anion gap 03/11/2016 11  5 - 15 Final  . Uric Acid, Serum 03/11/2016 7.6* 2.3 - 6.6 mg/dL Final    Assessment:  Joan Hull is a 82 y.o. female with a stage III marginal zone lymphoma.  She presented with a left axillary mass.  Biopsy on 01/15/2016 revealed an atypical lymphoid proliferation highly suspicious for non-Hodgkin's lymphoma.  There were diffuse sheets of B-cells (CD20 and CD79a +; CD5 and CD10-).  In addition, there was CD138 + abundant plasmacytoid cells with lambda light chain restriction. The lambda staining was apparently present in lymphoid cells.   Lymph node excisional biopsy on 02/06/2016 revealed mature B-cell lymphoma with plasmacytic differentiation, best classified as a marginal zone lymphoma.  PET scan on 01/31/2016 revealed active lymphoma within the chest, abdomen, and pelvis.  The largest lymph node was 1.7 cm.  Hepatitis B core antibody IgM, hepatitis B surface antigen, and hepatitis C antibody  were negative on 01/21/2016.  Hepatitis B core antibody was negative on 02/22/2016.  G6PD assay was negative on 02/22/2016.  She  developed acute renal insufficiency after initiation of Septra.  Renal ultrasound on 03/04/2016 revealed no evidence of hydronephrosis.  Baseline creatinine is 0.91 - 1.13 (CrCl 44 - 54 ml/min).  Creatinine was 1.54 (CrCl 29 ml/min) on 03/04/2016.  She is off Septra.  Creatinine is 1.16.  Symptomatically, she denies any new complaints.  Exam is unremarkable.  Uric acid is 7.6.  Plan: 1.  Labs today:  CBC with diff, CMP, uric acid. 2.  Re-review treatment plan.  Side effects reviewed.  Patient consented to treatment. 3.  Week #1 Rituxan today.  Decrease Benadryl premed to 25 mg. 4.  Restart allopurinol.  Encourage fluid intake. 5.  Discontinue nephrology consult. 6.  RTC on 07/27 for labs (BMP, uric acid) 7.  RTC in 1 week for MD assessment, labs (CBC with diff, CMP, uric acid), and week #2 Ritxan   Lequita Asal, MD  03/11/2016, 10:25 AM

## 2016-03-13 ENCOUNTER — Inpatient Hospital Stay: Payer: PPO

## 2016-03-16 ENCOUNTER — Encounter: Payer: Self-pay | Admitting: Hematology and Oncology

## 2016-03-17 ENCOUNTER — Other Ambulatory Visit: Payer: PPO

## 2016-03-17 ENCOUNTER — Other Ambulatory Visit: Payer: Self-pay | Admitting: Hematology and Oncology

## 2016-03-17 ENCOUNTER — Ambulatory Visit: Payer: PPO

## 2016-03-17 ENCOUNTER — Ambulatory Visit: Payer: PPO | Admitting: Hematology and Oncology

## 2016-03-17 NOTE — Progress Notes (Signed)
[facility] day:  03/18/16  Chief Complaint: Joan Hull is a 82 y.o. female with lymphoma who is seen for assessment prior to week #2 Rituxan.  HPI:  The patient was last seen in the medical oncology clinic on 03/11/2016.  At that time, she began weekly Rituxan.  Uric acid was 7.6.  She restarted allopurinol.  She tolerated her infusion well without difficulty.  During the interim, she has done well.  She feels like she might have a little cold.  She denies any fever.  She has been taking her allopurinol.   Past Medical History:  Diagnosis Date  . Anxiety   . Arthritis   . Atrial fibrillation ([place])   . CAD (coronary artery disease)   . Cancer [facility]) 2017   lymphoma  . Depression   . Dysrhythmia   . GERD (gastroesophageal reflux disease)   . Heart murmur   . Hx of colonic polyps   . Hyperlipidemia   . Hypertension   . Interstitial cystitis   . Non Q wave myocardial infarction ([place]) 06/27/11   [facility]  . Osteoporosis   . Presence of permanent cardiac pacemaker   . Shortness of breath dyspnea    doe    Past Surgical History:  Procedure Laterality Date  . AXILLARY LYMPH NODE BIOPSY Left 02/06/2016   Procedure: AXILLARY LYMPH NODE BIOPSY;  Surgeon: [name], MD;  Location: [facility] ORS;  Service: General;  Laterality: Left;  . CARDIAC CATHETERIZATION    . CATARACT EXTRACTION Right 2005  . COLONOSCOPY  2007   [name]  . CORONARY ANGIOPLASTY    . CORONARY ARTERY BYPASS GRAFT  1/13   [facility]  . CORONARY STENT PLACEMENT  7/12   RCA with drug eluting stent---[name]  . EYE SURGERY    . INSERT / REPLACE / REMOVE PACEMAKER    . PACEMAKER INSERTION  5/13   [facility]    Family History  Problem Relation Age of Onset  . Heart disease Mother     cad  . Hypertension Mother   . Heart disease Father     heart attack  . Heart disease Sister     half sister MI  . Heart disease Brother     cabg  . Heart disease Brother   .  Cancer Brother     BLADDER  . Cancer Sister     BRAIN  . Cancer Sister 67    OVARIAN    Social History:  reports that she quit smoking about 53 years ago. She has never used smokeless tobacco. She reports that she does not drink alcohol or use drugs.  She smoked when she was young (quit in 1964).  She lives alone in [place].  Her son lives in [place], [place]. The patient is alone today.  Allergies:  Allergies  Allergen Reactions  . Aspirin Other (See Comments)    Bladder spasm  . Diazepam     REACTION: worsened bladder problems  . Chlorhexidine Rash    Skin reacts only to scrub    Current Medications: Current Outpatient Prescriptions  Medication Sig Dispense Refill  . allopurinol (ZYLOPRIM) 300 MG tablet Take 1 tablet (300 mg total) by mouth daily. 30 tablet 1  . atenolol (TENORMIN) 25 MG tablet Take 1 tablet by mouth daily.    Marland Kitchen diltiazem (CARDIZEM CD) 180 MG 24 hr capsule Take 180 mg by mouth daily.   5  . ELIQUIS 2.5 MG  TABS tablet Take 2.5 mg by mouth 2 (two) times daily.     . famotidine (PEPCID) 10 MG tablet Take 10 mg by mouth daily.    . metoprolol (LOPRESSOR) 50 MG tablet TAKE ONE-HALF TABLET BY MOUTH TWICE DAILY 90 tablet 3  . Multiple Vitamins-Minerals (CENTRUM SILVER 50+WOMEN PO) Take 1 tablet by mouth.    Marland Kitchen PARoxetine (PAXIL) 10 MG tablet Take 1 tablet (10 mg total) by mouth daily. 90 tablet 3  . Probiotic Product (PROBIOTIC DAILY PO) Take 1 capsule by mouth daily.     Marland Kitchen torsemide (DEMADEX) 20 MG tablet TAKE ONE-HALF TABLET BY MOUTH ONCE DAILY 45 tablet 0  . vitamin B-12 (CYANOCOBALAMIN) 100 MCG tablet Take 100 mcg by mouth daily.    Marland Kitchen zolpidem (AMBIEN) 5 MG tablet TAKE ONE TABLET BY MOUTH AT BEDTIME AS NEEDED (Patient taking differently: TAKE ONE TABLET BY MOUTH AT BEDTIME) 90 tablet 0   No current facility-administered medications for this visit.     Review of Systems:  GENERAL:  Feels good. No fevers.  No sweats.  Weight stable. PERFORMANCE STATUS  (ECOG):  1 HEENT:  No visual changes, runny nose, sore throat, mouth sores or tenderness. Lungs: Shortness of breath with exertion.  Rare non-productive cough.  No hemoptysis. Cardiac:  Atrial fibrillation.  No chest pain, palpitations, orthopnea, or PND.  Sees cardiologist tomorrow. GI:  Appetite good.  No nausea, vomiting, diarrhea, constipation, melena or hematochezia. GU:  No urgency, frequency, dysuria, or hematuria. Musculoskeletal:  No back pain.  No joint pain.  No muscle tenderness. Extremities:  Hands stiff secondary to arthritis.  No pain or swelling. Skin:  No rashes or skin changes. Neuro:  No headache, numbness or weakness, balance or coordination issues. Endocrine:  No diabetes, thyroid issues, hot flashes or night sweats. Psych:  No mood changes, depression or anxiety. Pain:  No focal pain. Review of systems:  All other systems reviewed and found to be negative.  Physical Exam: Blood pressure 138/77, pulse 66, temperature 97 F (36.1 C), temperature source Tympanic, height 4\' 10"  (1.473 m), weight 101 lb 11.9 oz (46.2 kg), SpO2 97 %. GENERAL:  Well developed, well nourished, sitting comfortably in the exam room in no acute distress. MENTAL STATUS:  Alert and oriented to person, place and time. HEAD:  Dark brown styled hair.  Normocephalic, atraumatic, face symmetric, no Cushingoid features. EYES:  Glasses.  Blue eyes s/p cataract surgery on the right.  Pupils equal round and reactive to light and accomodation.  No conjunctivitis or scleral icterus. ENT:  Oropharynx clear without lesion.  Tongue normal. Mucous membranes moist.  RESPIRATORY:  Clear to auscultation without rales, wheezes or rhonchi. CARDIOVASCULAR:  Irregular rhythm without murmur, rub or gallop. CHEST:  Well healed midline sternotomy incision.  Pacemaker. ABDOMEN:  Soft, non-tender, with active bowel sounds, and no hepatosplenomegaly.  No masses. SKIN:  No rashes, ulcers or lesions. EXTREMITIES: Arthritis  changes in hands.  Left ganglion cyst.  No edema, no skin discoloration or tenderness.  No palpable cords. LYMPH NODES: No palpable cervical, supraclavicular, or inguinal adenopathy  NEUROLOGICAL: Unremarkable. PSYCH:  Appropriate.   Orders Only on 03/18/2016  Component Date Value Ref Range Status  . WBC 03/18/2016 4.8  3.6 - 11.0 K/uL Final  . RBC 03/18/2016 4.22  3.80 - 5.20 MIL/uL Final  . Hemoglobin 03/18/2016 13.6  12.0 - 16.0 g/dL Final  . HCT 05/18/2016 39.1  35.0 - 47.0 % Final  . MCV 03/18/2016 92.7  80.0 -  100.0 fL Final  . MCH 03/18/2016 32.3  26.0 - 34.0 pg Final  . MCHC 03/18/2016 34.8  32.0 - 36.0 g/dL Final  . RDW 03/18/2016 14.3  11.5 - 14.5 % Final  . Platelets 03/18/2016 185  150 - 440 K/uL Final  . Neutrophils Relative % 03/18/2016 54  % Final  . Neutro Abs 03/18/2016 2.6  1.4 - 6.5 K/uL Final  . Lymphocytes Relative 03/18/2016 23  % Final  . Lymphs Abs 03/18/2016 1.1  1.0 - 3.6 K/uL Final  . Monocytes Relative 03/18/2016 16  % Final  . Monocytes Absolute 03/18/2016 0.8  0.2 - 0.9 K/uL Final  . Eosinophils Relative 03/18/2016 6  % Final  . Eosinophils Absolute 03/18/2016 0.3  0 - 0.7 K/uL Final  . Basophils Relative 03/18/2016 1  % Final  . Basophils Absolute 03/18/2016 0.0  0 - 0.1 K/uL Final  . Sodium 03/18/2016 139  135 - 145 mmol/L Final  . Potassium 03/18/2016 3.7  3.5 - 5.1 mmol/L Final  . Chloride 03/18/2016 105  101 - 111 mmol/L Final  . CO2 03/18/2016 27  22 - 32 mmol/L Final  . Glucose, Bld 03/18/2016 86  65 - 99 mg/dL Final  . BUN 03/18/2016 27* 6 - 20 mg/dL Final  . Creatinine, Ser 03/18/2016 0.99  0.44 - 1.00 mg/dL Final  . Calcium 03/18/2016 9.2  8.9 - 10.3 mg/dL Final  . Total Protein 03/18/2016 7.1  6.5 - 8.1 g/dL Final  . Albumin 03/18/2016 4.4  3.5 - 5.0 g/dL Final  . AST 03/18/2016 27  15 - 41 U/L Final  . ALT 03/18/2016 15  14 - 54 U/L Final  . Alkaline Phosphatase 03/18/2016 110  38 - 126 U/L Final  . Total Bilirubin 03/18/2016 0.5  0.3  - 1.2 mg/dL Final  . GFR calc non Af Amer 03/18/2016 50* >60 mL/min Final  . GFR calc Af Amer 03/18/2016 58* >60 mL/min Final   Comment: (NOTE) The eGFR has been calculated using the CKD EPI equation. This calculation has not been validated in all clinical situations. eGFR's persistently <60 mL/min signify possible Chronic Kidney Disease.   . Anion gap 03/18/2016 7  5 - 15 Final  . Uric Acid, Serum 03/18/2016 3.7  2.3 - 6.6 mg/dL Final    Assessment:  MARYBELL ROBARDS is a 82 y.o. female with a stage III marginal zone lymphoma.  She presented with a left axillary mass.  Biopsy on 01/15/2016 revealed an atypical lymphoid proliferation highly suspicious for non-Hodgkin's lymphoma.  There were diffuse sheets of B-cells (CD20 and CD79a +; CD5 and CD10-).  In addition, there was CD138 + abundant plasmacytoid cells with lambda light chain restriction. The lambda staining was apparently present in lymphoid cells.   Lymph node excisional biopsy on 02/06/2016 revealed mature B-cell lymphoma with plasmacytic differentiation, best classified as a marginal zone lymphoma.  PET scan on 01/31/2016 revealed active lymphoma within the chest, abdomen, and pelvis.  The largest lymph node was 1.7 cm.  Hepatitis B core antibody IgM, hepatitis B surface antigen, and hepatitis C antibody were negative on 01/21/2016.  Hepatitis B core antibody was negative on 02/22/2016.  G6PD assay was negative on 02/22/2016.  She developed acute renal insufficiency after initiation of Septra.  Renal ultrasound on 03/04/2016 revealed no evidence of hydronephrosis.  Baseline creatinine is 0.91 - 1.13 (CrCl 44 - 54 ml/min).  Creatinine was 1.54 (CrCl 29 ml/min) on 03/04/2016.  She is off Septra.  Creatinine has  improved to 0.99.  She is s/p week #1 Rituxan (03/11/2016).  She tolerated her infusion well.  Symptomatically, she has a mild URI.  Exam is unremarkable.  Uric acid is 3.7.  Plan: 1.  Labs today:  CBC with diff, CMP, uric  acid. 2.  Week #2 Rituxan today. 3.  Continue allopurinol.  Encourage fluid intake. 4.  Discuss plans for reassessment after 4 weeks of treatment and possible maintenance Rituxan. 5.  RTC in 1 week for labs (BMP, uric acid) and week #3 Rituxan 6.  RTC in 2 weeks for labs (CBC with diff, BMP, uric acid) and week #4 Rituxan. 7.  RTC in 6 weeks for MD assess, labs (CBC with diff, CMP, LDH, uric acid).   Lequita Asal, MD  03/18/2016, 9:37 AM

## 2016-03-18 ENCOUNTER — Encounter: Payer: Self-pay | Admitting: Hematology and Oncology

## 2016-03-18 ENCOUNTER — Other Ambulatory Visit: Payer: Self-pay

## 2016-03-18 ENCOUNTER — Inpatient Hospital Stay: Payer: PPO

## 2016-03-18 ENCOUNTER — Inpatient Hospital Stay: Payer: PPO | Attending: Hematology and Oncology

## 2016-03-18 ENCOUNTER — Inpatient Hospital Stay (HOSPITAL_BASED_OUTPATIENT_CLINIC_OR_DEPARTMENT_OTHER): Payer: PPO | Admitting: Hematology and Oncology

## 2016-03-18 VITALS — BP 138/77 | HR 66 | Temp 97.0°F | Ht <= 58 in | Wt 101.7 lb

## 2016-03-18 DIAGNOSIS — Z5111 Encounter for antineoplastic chemotherapy: Secondary | ICD-10-CM | POA: Diagnosis not present

## 2016-03-18 DIAGNOSIS — M810 Age-related osteoporosis without current pathological fracture: Secondary | ICD-10-CM

## 2016-03-18 DIAGNOSIS — Z950 Presence of cardiac pacemaker: Secondary | ICD-10-CM | POA: Insufficient documentation

## 2016-03-18 DIAGNOSIS — Z95818 Presence of other cardiac implants and grafts: Secondary | ICD-10-CM | POA: Insufficient documentation

## 2016-03-18 DIAGNOSIS — Z79899 Other long term (current) drug therapy: Secondary | ICD-10-CM

## 2016-03-18 DIAGNOSIS — Z7901 Long term (current) use of anticoagulants: Secondary | ICD-10-CM

## 2016-03-18 DIAGNOSIS — R0609 Other forms of dyspnea: Secondary | ICD-10-CM | POA: Insufficient documentation

## 2016-03-18 DIAGNOSIS — Z8041 Family history of malignant neoplasm of ovary: Secondary | ICD-10-CM | POA: Diagnosis not present

## 2016-03-18 DIAGNOSIS — C8580 Other specified types of non-Hodgkin lymphoma, unspecified site: Secondary | ICD-10-CM

## 2016-03-18 DIAGNOSIS — M1990 Unspecified osteoarthritis, unspecified site: Secondary | ICD-10-CM

## 2016-03-18 DIAGNOSIS — I10 Essential (primary) hypertension: Secondary | ICD-10-CM

## 2016-03-18 DIAGNOSIS — I4891 Unspecified atrial fibrillation: Secondary | ICD-10-CM | POA: Diagnosis not present

## 2016-03-18 DIAGNOSIS — E785 Hyperlipidemia, unspecified: Secondary | ICD-10-CM

## 2016-03-18 DIAGNOSIS — C8308 Small cell B-cell lymphoma, lymph nodes of multiple sites: Secondary | ICD-10-CM | POA: Diagnosis not present

## 2016-03-18 DIAGNOSIS — Z87891 Personal history of nicotine dependence: Secondary | ICD-10-CM | POA: Diagnosis not present

## 2016-03-18 DIAGNOSIS — Z8052 Family history of malignant neoplasm of bladder: Secondary | ICD-10-CM | POA: Insufficient documentation

## 2016-03-18 DIAGNOSIS — K219 Gastro-esophageal reflux disease without esophagitis: Secondary | ICD-10-CM | POA: Diagnosis not present

## 2016-03-18 DIAGNOSIS — I2510 Atherosclerotic heart disease of native coronary artery without angina pectoris: Secondary | ICD-10-CM | POA: Insufficient documentation

## 2016-03-18 DIAGNOSIS — I252 Old myocardial infarction: Secondary | ICD-10-CM | POA: Insufficient documentation

## 2016-03-18 LAB — CBC WITH DIFFERENTIAL/PLATELET
Basophils Absolute: 0 10*3/uL (ref 0–0.1)
Basophils Relative: 1 %
Eosinophils Absolute: 0.3 10*3/uL (ref 0–0.7)
Eosinophils Relative: 6 %
HCT: 39.1 % (ref 35.0–47.0)
Hemoglobin: 13.6 g/dL (ref 12.0–16.0)
Lymphocytes Relative: 23 %
Lymphs Abs: 1.1 10*3/uL (ref 1.0–3.6)
MCH: 32.3 pg (ref 26.0–34.0)
MCHC: 34.8 g/dL (ref 32.0–36.0)
MCV: 92.7 fL (ref 80.0–100.0)
Monocytes Absolute: 0.8 10*3/uL (ref 0.2–0.9)
Monocytes Relative: 16 %
Neutro Abs: 2.6 10*3/uL (ref 1.4–6.5)
Neutrophils Relative %: 54 %
Platelets: 185 10*3/uL (ref 150–440)
RBC: 4.22 MIL/uL (ref 3.80–5.20)
RDW: 14.3 % (ref 11.5–14.5)
WBC: 4.8 10*3/uL (ref 3.6–11.0)

## 2016-03-18 LAB — COMPREHENSIVE METABOLIC PANEL
ALT: 15 U/L (ref 14–54)
AST: 27 U/L (ref 15–41)
Albumin: 4.4 g/dL (ref 3.5–5.0)
Alkaline Phosphatase: 110 U/L (ref 38–126)
Anion gap: 7 (ref 5–15)
BUN: 27 mg/dL — ABNORMAL HIGH (ref 6–20)
CO2: 27 mmol/L (ref 22–32)
Calcium: 9.2 mg/dL (ref 8.9–10.3)
Chloride: 105 mmol/L (ref 101–111)
Creatinine, Ser: 0.99 mg/dL (ref 0.44–1.00)
GFR calc Af Amer: 58 mL/min — ABNORMAL LOW (ref >60)
GFR calc non Af Amer: 50 mL/min — ABNORMAL LOW (ref >60)
Glucose, Bld: 86 mg/dL (ref 65–99)
Potassium: 3.7 mmol/L (ref 3.5–5.1)
Sodium: 139 mmol/L (ref 135–145)
Total Bilirubin: 0.5 mg/dL (ref 0.3–1.2)
Total Protein: 7.1 g/dL (ref 6.5–8.1)

## 2016-03-18 LAB — URIC ACID: Uric Acid, Serum: 3.7 mg/dL (ref 2.3–6.6)

## 2016-03-18 MED ORDER — ACETAMINOPHEN 325 MG PO TABS
650.0000 mg | ORAL_TABLET | Freq: Once | ORAL | Status: AC
  Administered 2016-03-18: 650 mg via ORAL
  Filled 2016-03-18: qty 2

## 2016-03-18 MED ORDER — DIPHENHYDRAMINE HCL 25 MG PO CAPS
25.0000 mg | ORAL_CAPSULE | Freq: Once | ORAL | Status: AC
  Administered 2016-03-18: 25 mg via ORAL
  Filled 2016-03-18: qty 1

## 2016-03-18 MED ORDER — SODIUM CHLORIDE 0.9 % IV SOLN: 375.0000 mg/m2 | Freq: Once | INTRAVENOUS | Status: DC

## 2016-03-18 MED ORDER — SODIUM CHLORIDE 0.9 % IV SOLN
Freq: Once | INTRAVENOUS | Status: AC
  Administered 2016-03-18: 11:00:00 via INTRAVENOUS
  Filled 2016-03-18: qty 1000

## 2016-03-18 MED ORDER — SODIUM CHLORIDE 0.9 % IV SOLN
375.0000 mg/m2 | Freq: Once | INTRAVENOUS | Status: AC
  Administered 2016-03-18: 500 mg via INTRAVENOUS
  Filled 2016-03-18: qty 50

## 2016-03-18 NOTE — Progress Notes (Signed)
Patient here for follow up. She has a "cold" today. Occasional  cough non productive.

## 2016-03-19 ENCOUNTER — Other Ambulatory Visit: Payer: Self-pay | Admitting: Internal Medicine

## 2016-03-19 NOTE — Telephone Encounter (Signed)
Pt left v/m requesting status of zolpidem refill. 

## 2016-03-19 NOTE — Telephone Encounter (Signed)
I did not see any request for this Okay to refill #90 x 0

## 2016-03-19 NOTE — Telephone Encounter (Signed)
Last filled 12-20-15 #90 Last OV 01-03-16 Next OV 06-23-16

## 2016-03-20 ENCOUNTER — Other Ambulatory Visit: Payer: Self-pay | Admitting: *Deleted

## 2016-03-20 DIAGNOSIS — C8580 Other specified types of non-Hodgkin lymphoma, unspecified site: Secondary | ICD-10-CM

## 2016-03-20 NOTE — Telephone Encounter (Signed)
Left refill on voice mail at pharmacy  

## 2016-03-24 ENCOUNTER — Other Ambulatory Visit: Payer: Self-pay | Admitting: Hematology and Oncology

## 2016-03-25 ENCOUNTER — Inpatient Hospital Stay: Payer: PPO

## 2016-03-25 VITALS — BP 127/60 | HR 60 | Temp 97.0°F | Resp 20

## 2016-03-25 DIAGNOSIS — C8580 Other specified types of non-Hodgkin lymphoma, unspecified site: Secondary | ICD-10-CM

## 2016-03-25 DIAGNOSIS — Z5111 Encounter for antineoplastic chemotherapy: Secondary | ICD-10-CM | POA: Diagnosis not present

## 2016-03-25 LAB — BASIC METABOLIC PANEL
Anion gap: 6 (ref 5–15)
BUN: 24 mg/dL — ABNORMAL HIGH (ref 6–20)
CO2: 27 mmol/L (ref 22–32)
Calcium: 9.6 mg/dL (ref 8.9–10.3)
Chloride: 104 mmol/L (ref 101–111)
Creatinine, Ser: 0.93 mg/dL (ref 0.44–1.00)
GFR calc Af Amer: >60 mL/min (ref >60)
GFR calc non Af Amer: 54 mL/min — ABNORMAL LOW (ref >60)
Glucose, Bld: 121 mg/dL — ABNORMAL HIGH (ref 65–99)
Potassium: 4.1 mmol/L (ref 3.5–5.1)
Sodium: 137 mmol/L (ref 135–145)

## 2016-03-25 LAB — URIC ACID: Uric Acid, Serum: 3.2 mg/dL (ref 2.3–6.6)

## 2016-03-25 MED ORDER — ACETAMINOPHEN 325 MG PO TABS
650.0000 mg | ORAL_TABLET | Freq: Once | ORAL | Status: AC
  Administered 2016-03-25: 650 mg via ORAL
  Filled 2016-03-25: qty 2

## 2016-03-25 MED ORDER — SODIUM CHLORIDE 0.9 % IV SOLN
Freq: Once | INTRAVENOUS | Status: AC
  Administered 2016-03-25: 09:00:00 via INTRAVENOUS
  Filled 2016-03-25: qty 1000

## 2016-03-25 MED ORDER — SODIUM CHLORIDE 0.9 % IV SOLN
375.0000 mg/m2 | Freq: Once | INTRAVENOUS | Status: AC
  Administered 2016-03-25: 500 mg via INTRAVENOUS
  Filled 2016-03-25: qty 50

## 2016-03-25 MED ORDER — DIPHENHYDRAMINE HCL 25 MG PO CAPS
25.0000 mg | ORAL_CAPSULE | Freq: Once | ORAL | Status: AC
  Administered 2016-03-25: 25 mg via ORAL
  Filled 2016-03-25: qty 1

## 2016-03-25 MED ORDER — SODIUM CHLORIDE 0.9 % IV SOLN: 375.0000 mg/m2 | Freq: Once | INTRAVENOUS | Status: DC

## 2016-04-01 ENCOUNTER — Other Ambulatory Visit: Payer: Self-pay | Admitting: Hematology and Oncology

## 2016-04-01 ENCOUNTER — Inpatient Hospital Stay: Payer: PPO

## 2016-04-01 VITALS — BP 138/71 | HR 66 | Temp 97.0°F | Resp 20

## 2016-04-01 DIAGNOSIS — Z5111 Encounter for antineoplastic chemotherapy: Secondary | ICD-10-CM | POA: Diagnosis not present

## 2016-04-01 DIAGNOSIS — C8580 Other specified types of non-Hodgkin lymphoma, unspecified site: Secondary | ICD-10-CM

## 2016-04-01 LAB — CBC WITH DIFFERENTIAL/PLATELET
Basophils Absolute: 0 10*3/uL (ref 0–0.1)
Basophils Relative: 0 %
Eosinophils Absolute: 0.2 10*3/uL (ref 0–0.7)
Eosinophils Relative: 4 %
HCT: 35.6 % (ref 35.0–47.0)
Hemoglobin: 12.6 g/dL (ref 12.0–16.0)
Lymphocytes Relative: 17 %
Lymphs Abs: 1.1 10*3/uL (ref 1.0–3.6)
MCH: 32.6 pg (ref 26.0–34.0)
MCHC: 35.3 g/dL (ref 32.0–36.0)
MCV: 92.3 fL (ref 80.0–100.0)
Monocytes Absolute: 1.1 10*3/uL — ABNORMAL HIGH (ref 0.2–0.9)
Monocytes Relative: 17 %
Neutro Abs: 4 10*3/uL (ref 1.4–6.5)
Neutrophils Relative %: 62 %
Platelets: 215 10*3/uL (ref 150–440)
RBC: 3.85 MIL/uL (ref 3.80–5.20)
RDW: 14.9 % — ABNORMAL HIGH (ref 11.5–14.5)
WBC: 6.4 10*3/uL (ref 3.6–11.0)

## 2016-04-01 LAB — BASIC METABOLIC PANEL
Anion gap: 6 (ref 5–15)
BUN: 23 mg/dL — ABNORMAL HIGH (ref 6–20)
CO2: 24 mmol/L (ref 22–32)
Calcium: 9.3 mg/dL (ref 8.9–10.3)
Chloride: 108 mmol/L (ref 101–111)
Creatinine, Ser: 0.83 mg/dL (ref 0.44–1.00)
GFR calc Af Amer: >60 mL/min (ref >60)
GFR calc non Af Amer: >60 mL/min (ref >60)
Glucose, Bld: 115 mg/dL — ABNORMAL HIGH (ref 65–99)
Potassium: 4 mmol/L (ref 3.5–5.1)
Sodium: 138 mmol/L (ref 135–145)

## 2016-04-01 LAB — URIC ACID: Uric Acid, Serum: 3 mg/dL (ref 2.3–6.6)

## 2016-04-01 MED ORDER — SODIUM CHLORIDE 0.9 % IV SOLN
500.0000 mg | Freq: Once | INTRAVENOUS | Status: AC
  Administered 2016-04-01: 500 mg via INTRAVENOUS
  Filled 2016-04-01: qty 50

## 2016-04-01 MED ORDER — DIPHENHYDRAMINE HCL 25 MG PO CAPS
25.0000 mg | ORAL_CAPSULE | Freq: Once | ORAL | Status: AC
  Administered 2016-04-01: 25 mg via ORAL
  Filled 2016-04-01: qty 1

## 2016-04-01 MED ORDER — SODIUM CHLORIDE 0.9 % IV SOLN
Freq: Once | INTRAVENOUS | Status: AC
  Administered 2016-04-01: 10:00:00 via INTRAVENOUS
  Filled 2016-04-01: qty 1000

## 2016-04-01 MED ORDER — ACETAMINOPHEN 325 MG PO TABS
650.0000 mg | ORAL_TABLET | Freq: Once | ORAL | Status: AC
  Administered 2016-04-01: 650 mg via ORAL
  Filled 2016-04-01: qty 2

## 2016-04-01 MED ORDER — SODIUM CHLORIDE 0.9 % IV SOLN: 375.0000 mg/m2 | Freq: Once | INTRAVENOUS | Status: DC

## 2016-04-29 ENCOUNTER — Other Ambulatory Visit: Payer: Self-pay

## 2016-04-29 ENCOUNTER — Encounter: Payer: Self-pay | Admitting: Hematology and Oncology

## 2016-04-29 ENCOUNTER — Inpatient Hospital Stay: Payer: PPO | Attending: Hematology and Oncology

## 2016-04-29 ENCOUNTER — Inpatient Hospital Stay (HOSPITAL_BASED_OUTPATIENT_CLINIC_OR_DEPARTMENT_OTHER): Payer: PPO | Admitting: Hematology and Oncology

## 2016-04-29 VITALS — BP 133/68 | HR 61 | Temp 94.6°F | Resp 18 | Wt 104.3 lb

## 2016-04-29 DIAGNOSIS — Z8041 Family history of malignant neoplasm of ovary: Secondary | ICD-10-CM | POA: Diagnosis not present

## 2016-04-29 DIAGNOSIS — I2510 Atherosclerotic heart disease of native coronary artery without angina pectoris: Secondary | ICD-10-CM | POA: Diagnosis not present

## 2016-04-29 DIAGNOSIS — E785 Hyperlipidemia, unspecified: Secondary | ICD-10-CM | POA: Insufficient documentation

## 2016-04-29 DIAGNOSIS — M1990 Unspecified osteoarthritis, unspecified site: Secondary | ICD-10-CM

## 2016-04-29 DIAGNOSIS — Z87891 Personal history of nicotine dependence: Secondary | ICD-10-CM | POA: Insufficient documentation

## 2016-04-29 DIAGNOSIS — C8308 Small cell B-cell lymphoma, lymph nodes of multiple sites: Secondary | ICD-10-CM | POA: Insufficient documentation

## 2016-04-29 DIAGNOSIS — Z79899 Other long term (current) drug therapy: Secondary | ICD-10-CM

## 2016-04-29 DIAGNOSIS — M810 Age-related osteoporosis without current pathological fracture: Secondary | ICD-10-CM | POA: Diagnosis not present

## 2016-04-29 DIAGNOSIS — I4891 Unspecified atrial fibrillation: Secondary | ICD-10-CM | POA: Diagnosis not present

## 2016-04-29 DIAGNOSIS — N289 Disorder of kidney and ureter, unspecified: Secondary | ICD-10-CM | POA: Insufficient documentation

## 2016-04-29 DIAGNOSIS — K219 Gastro-esophageal reflux disease without esophagitis: Secondary | ICD-10-CM | POA: Insufficient documentation

## 2016-04-29 DIAGNOSIS — I252 Old myocardial infarction: Secondary | ICD-10-CM | POA: Insufficient documentation

## 2016-04-29 DIAGNOSIS — Z950 Presence of cardiac pacemaker: Secondary | ICD-10-CM

## 2016-04-29 DIAGNOSIS — C8580 Other specified types of non-Hodgkin lymphoma, unspecified site: Secondary | ICD-10-CM

## 2016-04-29 DIAGNOSIS — R0609 Other forms of dyspnea: Secondary | ICD-10-CM

## 2016-04-29 DIAGNOSIS — Z9221 Personal history of antineoplastic chemotherapy: Secondary | ICD-10-CM | POA: Insufficient documentation

## 2016-04-29 DIAGNOSIS — Z7901 Long term (current) use of anticoagulants: Secondary | ICD-10-CM | POA: Diagnosis not present

## 2016-04-29 DIAGNOSIS — I10 Essential (primary) hypertension: Secondary | ICD-10-CM | POA: Diagnosis not present

## 2016-04-29 DIAGNOSIS — Z8052 Family history of malignant neoplasm of bladder: Secondary | ICD-10-CM | POA: Insufficient documentation

## 2016-04-29 LAB — CBC WITH DIFFERENTIAL/PLATELET
Basophils Absolute: 0 10*3/uL (ref 0–0.1)
Basophils Relative: 1 %
Eosinophils Absolute: 0.2 10*3/uL (ref 0–0.7)
Eosinophils Relative: 5 %
HCT: 37 % (ref 35.0–47.0)
Hemoglobin: 12.9 g/dL (ref 12.0–16.0)
Lymphocytes Relative: 22 %
Lymphs Abs: 1.1 10*3/uL (ref 1.0–3.6)
MCH: 32.5 pg (ref 26.0–34.0)
MCHC: 34.8 g/dL (ref 32.0–36.0)
MCV: 93.3 fL (ref 80.0–100.0)
Monocytes Absolute: 0.6 10*3/uL (ref 0.2–0.9)
Monocytes Relative: 13 %
Neutro Abs: 2.9 10*3/uL (ref 1.4–6.5)
Neutrophils Relative %: 59 %
Platelets: 247 10*3/uL (ref 150–440)
RBC: 3.97 MIL/uL (ref 3.80–5.20)
RDW: 14.8 % — ABNORMAL HIGH (ref 11.5–14.5)
WBC: 4.9 10*3/uL (ref 3.6–11.0)

## 2016-04-29 LAB — COMPREHENSIVE METABOLIC PANEL
ALT: 15 U/L (ref 14–54)
AST: 22 U/L (ref 15–41)
Albumin: 4.3 g/dL (ref 3.5–5.0)
Alkaline Phosphatase: 112 U/L (ref 38–126)
Anion gap: 6 (ref 5–15)
BUN: 23 mg/dL — ABNORMAL HIGH (ref 6–20)
CO2: 27 mmol/L (ref 22–32)
Calcium: 9.2 mg/dL (ref 8.9–10.3)
Chloride: 103 mmol/L (ref 101–111)
Creatinine, Ser: 0.9 mg/dL (ref 0.44–1.00)
GFR calc Af Amer: >60 mL/min (ref >60)
GFR calc non Af Amer: 55 mL/min — ABNORMAL LOW (ref >60)
Glucose, Bld: 100 mg/dL — ABNORMAL HIGH (ref 65–99)
Potassium: 4.1 mmol/L (ref 3.5–5.1)
Sodium: 136 mmol/L (ref 135–145)
Total Bilirubin: 0.6 mg/dL (ref 0.3–1.2)
Total Protein: 7 g/dL (ref 6.5–8.1)

## 2016-04-29 LAB — LACTATE DEHYDROGENASE: LDH: 145 U/L (ref 98–192)

## 2016-04-29 LAB — URIC ACID: Uric Acid, Serum: 3.6 mg/dL (ref 2.3–6.6)

## 2016-04-29 MED ORDER — ALLOPURINOL 300 MG PO TABS: 300.0000 mg | ORAL_TABLET | Freq: Every day | ORAL | 1 refills | Status: DC

## 2016-04-29 NOTE — Progress Notes (Signed)
Patient will need refill for Allopurinol if she is to continue on the medication.  Also states she is SOB with exertion.  Otherwise, offers no complaints.

## 2016-04-29 NOTE — Progress Notes (Signed)
[facility] day:  04/29/16  Chief Complaint: Joan Hull is a 82 y.o. female with stage III marginal zone lymphoma who is seen for 1 month assessment after completion of Rituxan.  HPI:  The patient was last seen in the medical oncology clinic on 03/18/2016.  At that time, she was doing well.  She received week #2 Rituxan.   She received Rituxan week #3 and #4 (03/25/2016 and 04/01/2016).  During the interim,  She has been taking her allopurinol. She notes a little shortness of breath when walking in the parking lot.  She denies any fevers or sweats.   Past Medical History:  Diagnosis Date  . Anxiety   . Arthritis   . Atrial fibrillation ([place])   . CAD (coronary artery disease)   . Cancer [facility]) 2017   lymphoma  . Depression   . Dysrhythmia   . GERD (gastroesophageal reflux disease)   . Heart murmur   . Hx of colonic polyps   . Hyperlipidemia   . Hypertension   . Interstitial cystitis   . Non Q wave myocardial infarction ([place]) 06/27/11   [facility]  . Osteoporosis   . Presence of permanent cardiac pacemaker   . Shortness of breath dyspnea    doe    Past Surgical History:  Procedure Laterality Date  . AXILLARY LYMPH NODE BIOPSY Left 02/06/2016   Procedure: AXILLARY LYMPH NODE BIOPSY;  Surgeon: [name], MD;  Location: [facility] ORS;  Service: General;  Laterality: Left;  . CARDIAC CATHETERIZATION    . CATARACT EXTRACTION Right 2005  . COLONOSCOPY  2007   [name]  . CORONARY ANGIOPLASTY    . CORONARY ARTERY BYPASS GRAFT  1/13   [facility]  . CORONARY STENT PLACEMENT  7/12   RCA with drug eluting stent---[name]  . EYE SURGERY    . INSERT / REPLACE / REMOVE PACEMAKER    . PACEMAKER INSERTION  5/13   [facility]    Family History  Problem Relation Age of Onset  . Heart disease Mother     cad  . Hypertension Mother   . Heart disease Father     heart attack  . Heart disease Sister     half sister MI  . Heart disease  Brother     cabg  . Heart disease Brother   . Cancer Brother     BLADDER  . Cancer Sister     BRAIN  . Cancer Sister 58    OVARIAN    Social History:  reports that she quit smoking about 53 years ago. She has never used smokeless tobacco. She reports that she does not drink alcohol or use drugs.  She smoked when she was young (quit in 1964).  She lives alone in [place].  Her son lives in [place], [place]. The patient is alone today.  Allergies:  Allergies  Allergen Reactions  . Aspirin Other (See Comments)    Bladder spasm  . Diazepam     REACTION: worsened bladder problems  . Chlorhexidine Rash    Skin reacts only to scrub    Current Medications: Current Outpatient Prescriptions  Medication Sig Dispense Refill  . allopurinol (ZYLOPRIM) 300 MG tablet Take 1 tablet (300 mg total) by mouth daily. 30 tablet 1  . atenolol (TENORMIN) 25 MG tablet Take 1 tablet by mouth daily.    Marland Kitchen diltiazem (CARDIZEM CD) 180 MG 24 hr capsule Take 180 mg by mouth daily.  5  . ELIQUIS 2.5 MG TABS tablet Take 2.5 mg by mouth 2 (two) times daily.     . famotidine (PEPCID) 10 MG tablet Take 10 mg by mouth daily.    . metoprolol (LOPRESSOR) 50 MG tablet TAKE ONE-HALF TABLET BY MOUTH TWICE DAILY 90 tablet 3  . Multiple Vitamins-Minerals (CENTRUM SILVER 50+WOMEN PO) Take 1 tablet by mouth.    Marland Kitchen PARoxetine (PAXIL) 10 MG tablet Take 1 tablet (10 mg total) by mouth daily. 90 tablet 3  . Probiotic Product (PROBIOTIC DAILY PO) Take 1 capsule by mouth daily.     Marland Kitchen torsemide (DEMADEX) 20 MG tablet TAKE ONE-HALF TABLET BY MOUTH ONCE DAILY 45 tablet 0  . vitamin B-12 (CYANOCOBALAMIN) 100 MCG tablet Take 100 mcg by mouth daily.    Marland Kitchen zolpidem (AMBIEN) 5 MG tablet TAKE ONE TABLET BY MOUTH AT BEDTIME AS NEEDED 90 tablet 0   No current facility-administered medications for this visit.     Review of Systems:  GENERAL:  Feels good. No fevers.  No sweats.  Weight up 3 pounds. PERFORMANCE STATUS (ECOG):   1 HEENT:  No visual changes, runny nose, sore throat, mouth sores or tenderness. Lungs: Shortness of breath with exertion.  No cough.  No hemoptysis. Cardiac:  Atrial fibrillation.  No chest pain, palpitations, orthopnea, or PND.  Sees cardiologist tomorrow. GI:  Appetite good.  No nausea, vomiting, diarrhea, constipation, melena or hematochezia. GU:  No urgency, frequency, dysuria, or hematuria. Musculoskeletal:  No back pain.  No joint pain.  No muscle tenderness. Extremities:  Hands stiff secondary to arthritis.  No pain or swelling. Skin:  No rashes or skin changes. Neuro:  No headache, numbness or weakness, balance or coordination issues. Endocrine:  No diabetes, thyroid issues, hot flashes or night sweats. Psych:  No mood changes, depression or anxiety. Pain:  No focal pain. Review of systems:  All other systems reviewed and found to be negative.  Physical Exam: Blood pressure 133/68, pulse 61, temperature (!) 94.6 F (34.8 C), temperature source Tympanic, resp. rate 18, weight 104 lb 4.4 oz (47.3 kg). GENERAL:  Well developed, well nourished, sitting comfortably in the exam room in no acute distress. MENTAL STATUS:  Alert and oriented to person, place and time. HEAD:  Dark brown wavy hair.  Normocephalic, atraumatic, face symmetric, no Cushingoid features. EYES:  Glasses.  Blue eyes s/p cataract surgery on the right.  Pupils equal round and reactive to light and accomodation.  No conjunctivitis or scleral icterus. ENT:  Oropharynx clear without lesion.  Tongue normal. Mucous membranes moist.  RESPIRATORY:  Clear to auscultation without rales, wheezes or rhonchi. CARDIOVASCULAR:  Irregular rhythm without murmur, rub or gallop. CHEST:  Well healed midline sternotomy incision.  Pacemaker. ABDOMEN:  Soft, non-tender, with active bowel sounds, and no hepatosplenomegaly.  No masses. SKIN:  No rashes, ulcers or lesions. EXTREMITIES: Arthritis changes in hands.  Left ganglion cyst.  No  edema, no skin discoloration or tenderness.  No palpable cords. LYMPH NODES: No palpable cervical, supraclavicular, or inguinal adenopathy  NEUROLOGICAL: Unremarkable. PSYCH:  Appropriate.   Orders Only on 04/29/2016  Component Date Value Ref Range Status  . WBC 04/29/2016 4.9  3.6 - 11.0 K/uL Final  . RBC 04/29/2016 3.97  3.80 - 5.20 MIL/uL Final  . Hemoglobin 04/29/2016 12.9  12.0 - 16.0 g/dL Final  . HCT 04/29/2016 37.0  35.0 - 47.0 % Final  . MCV 04/29/2016 93.3  80.0 - 100.0 fL Final  . [facility] 04/29/2016  32.5  26.0 - 34.0 pg Final  . MCHC 04/29/2016 34.8  32.0 - 36.0 g/dL Final  . RDW 04/29/2016 14.8* 11.5 - 14.5 % Final  . Platelets 04/29/2016 247  150 - 440 K/uL Final  . Neutrophils Relative % 04/29/2016 59  % Final  . Neutro Abs 04/29/2016 2.9  1.4 - 6.5 K/uL Final  . Lymphocytes Relative 04/29/2016 22  % Final  . Lymphs Abs 04/29/2016 1.1  1.0 - 3.6 K/uL Final  . Monocytes Relative 04/29/2016 13  % Final  . Monocytes Absolute 04/29/2016 0.6  0.2 - 0.9 K/uL Final  . Eosinophils Relative 04/29/2016 5  % Final  . Eosinophils Absolute 04/29/2016 0.2  0 - 0.7 K/uL Final  . Basophils Relative 04/29/2016 1  % Final  . Basophils Absolute 04/29/2016 0.0  0 - 0.1 K/uL Final  . Sodium 04/29/2016 136  135 - 145 mmol/L Final  . Potassium 04/29/2016 4.1  3.5 - 5.1 mmol/L Final  . Chloride 04/29/2016 103  101 - 111 mmol/L Final  . CO2 04/29/2016 27  22 - 32 mmol/L Final  . Glucose, Bld 04/29/2016 100* 65 - 99 mg/dL Final  . BUN 04/29/2016 23* 6 - 20 mg/dL Final  . Creatinine, Ser 04/29/2016 0.90  0.44 - 1.00 mg/dL Final  . Calcium 04/29/2016 9.2  8.9 - 10.3 mg/dL Final  . Total Protein 04/29/2016 7.0  6.5 - 8.1 g/dL Final  . Albumin 04/29/2016 4.3  3.5 - 5.0 g/dL Final  . AST 04/29/2016 22  15 - 41 U/L Final  . ALT 04/29/2016 15  14 - 54 U/L Final  . Alkaline Phosphatase 04/29/2016 112  38 - 126 U/L Final  . Total Bilirubin 04/29/2016 0.6  0.3 - 1.2 mg/dL Final  . GFR calc non Af Amer  04/29/2016 55* >60 mL/min Final  . GFR calc Af Amer 04/29/2016 >60  >60 mL/min Final   Comment: (NOTE) The eGFR has been calculated using the CKD EPI equation. This calculation has not been validated in all clinical situations. eGFR's persistently <60 mL/min signify possible Chronic Kidney Disease.   . Anion gap 04/29/2016 6  5 - 15 Final  . LDH 04/29/2016 145  98 - 192 U/L Final    Assessment:  Joan Hull is a 82 y.o. female with a stage III marginal zone lymphoma.  She presented with a left axillary mass.  Biopsy on 01/15/2016 revealed an atypical lymphoid proliferation highly suspicious for non-Hodgkin's lymphoma.  There were diffuse sheets of B-cells (CD20 and CD79a +; CD5 and CD10-).  In addition, there was CD138 + abundant plasmacytoid cells with lambda light chain restriction. The lambda staining was apparently present in lymphoid cells.   Lymph node excisional biopsy on 02/06/2016 revealed mature B-cell lymphoma with plasmacytic differentiation, best classified as a marginal zone lymphoma.  PET scan on 01/31/2016 revealed active lymphoma within the chest, abdomen, and pelvis.  The largest lymph node was 1.7 cm.  Hepatitis B core antibody IgM, hepatitis B surface antigen, and hepatitis C antibody were negative on 01/21/2016.  Hepatitis B core antibody was negative on 02/22/2016.  G6PD assay was negative on 02/22/2016.  She developed acute renal insufficiency after initiation of Septra.  Renal ultrasound on 03/04/2016 revealed no evidence of hydronephrosis.  Baseline creatinine is 0.91 - 1.13 (CrCl 44 - 54 ml/min).  Creatinine was 1.54 (CrCl 29 ml/min) on 03/04/2016 and improved to baseline off Septra.  She received weekly Rituxan x 4 (03/11/2016 - 04/01/2016).  She  tolerated her infusions well.  Symptomatically, she denies any B symptoms.  Exam is unremarkable.  Uric acid is 3.6.  Plan: 1.  Labs today:  CBC with diff, CMP, LDH, uric acid. 2.  Continue allopurinol.   3.   Schedule PET scan re: restaging prior to maintenance Rituxan. 4.  Discuss maintenance Rituxan 375 mg/m2 every 3 months for 2 years if scans reveal stable or responsive disease. 5.  RTC after PET scan for MD assess, review of scans, and discussion regarding direction of therapy.   Lequita Asal, MD  04/29/2016, 9:09 AM

## 2016-04-29 NOTE — Progress Notes (Signed)
START ON PATHWAY REGIMEN - Lymphoma and CLL  LYOS201: Rituximab 375 mg/m2 Weekly x 4 Weeks   Administer weekly:     Rituximab (Rituxan(R)) 375 mg/m2 in _____ mL NS IV days 1, 8, 15, 22.  Initiate first dose at a rate of 50 mg/hr.  In the absence of infusion toxicity, increase infusion rate by 50 mg/hr increments every 30 minutes, to a maximum of 400 mg/hr.  For  follicular and DLBC lymphoma patients see "Rapid infusion protocol" link for more information about accelerating the infusion time of rituximab. Dose Mod: None Additional Orders: Hepatitis B&C testing recommended prior to rituximab use on all patients. Final concentration of rituximab must be between 1 and 4 mg/ml.  **Always confirm dose/schedule in your pharmacy ordering system**    Patient Characteristics: Marginal Zone, Systemic, First Line, Symptomatic Disease Type: Marginal Zone Disease Type: Not Applicable Localized or Systemic Disease? Systemic [place] Stage: IIIA Line of therapy: First Line Asymptomatic or Symptomatic? Symptomatic  Intent of Therapy: Non-Curative / Palliative Intent, Discussed with Patient

## 2016-05-01 ENCOUNTER — Ambulatory Visit
Admission: RE | Admit: 2016-05-01 | Discharge: 2016-05-01 | Disposition: A | Discharge status: Home / Self Care | Payer: PPO | Source: Ambulatory Visit | Attending: Hematology and Oncology | Admitting: Hematology and Oncology

## 2016-05-01 DIAGNOSIS — M8580 Other specified disorders of bone density and structure, unspecified site: Secondary | ICD-10-CM | POA: Diagnosis not present

## 2016-05-01 DIAGNOSIS — I517 Cardiomegaly: Secondary | ICD-10-CM | POA: Diagnosis not present

## 2016-05-01 DIAGNOSIS — E041 Nontoxic single thyroid nodule: Secondary | ICD-10-CM | POA: Insufficient documentation

## 2016-05-01 DIAGNOSIS — C8590 Non-Hodgkin lymphoma, unspecified, unspecified site: Secondary | ICD-10-CM | POA: Diagnosis not present

## 2016-05-01 DIAGNOSIS — I700 Atherosclerosis of aorta: Secondary | ICD-10-CM | POA: Insufficient documentation

## 2016-05-01 DIAGNOSIS — C8580 Other specified types of non-Hodgkin lymphoma, unspecified site: Secondary | ICD-10-CM | POA: Insufficient documentation

## 2016-05-01 DIAGNOSIS — K5730 Diverticulosis of large intestine without perforation or abscess without bleeding: Secondary | ICD-10-CM | POA: Insufficient documentation

## 2016-05-01 DIAGNOSIS — I2510 Atherosclerotic heart disease of native coronary artery without angina pectoris: Secondary | ICD-10-CM | POA: Diagnosis not present

## 2016-05-01 LAB — GLUCOSE, CAPILLARY: Glucose-Capillary: 96 mg/dL (ref 65–99)

## 2016-05-01 MED ORDER — FLUDEOXYGLUCOSE F - 18 (FDG) INJECTION
12.0000 | Freq: Once | INTRAVENOUS | Status: AC | PRN
  Administered 2016-05-01: 11.98 via INTRAVENOUS

## 2016-05-09 ENCOUNTER — Inpatient Hospital Stay (HOSPITAL_BASED_OUTPATIENT_CLINIC_OR_DEPARTMENT_OTHER): Payer: PPO | Admitting: Hematology and Oncology

## 2016-05-09 ENCOUNTER — Other Ambulatory Visit: Payer: Self-pay | Admitting: *Deleted

## 2016-05-09 ENCOUNTER — Encounter: Payer: Self-pay | Admitting: Hematology and Oncology

## 2016-05-09 VITALS — BP 148/76 | HR 64 | Temp 98.1°F | Ht 59.0 in | Wt 102.5 lb

## 2016-05-09 DIAGNOSIS — Z9221 Personal history of antineoplastic chemotherapy: Secondary | ICD-10-CM

## 2016-05-09 DIAGNOSIS — M1990 Unspecified osteoarthritis, unspecified site: Secondary | ICD-10-CM

## 2016-05-09 DIAGNOSIS — Z79899 Other long term (current) drug therapy: Secondary | ICD-10-CM

## 2016-05-09 DIAGNOSIS — C8308 Small cell B-cell lymphoma, lymph nodes of multiple sites: Secondary | ICD-10-CM

## 2016-05-09 DIAGNOSIS — N289 Disorder of kidney and ureter, unspecified: Secondary | ICD-10-CM | POA: Diagnosis not present

## 2016-05-09 DIAGNOSIS — C8580 Other specified types of non-Hodgkin lymphoma, unspecified site: Secondary | ICD-10-CM

## 2016-05-09 DIAGNOSIS — Z7901 Long term (current) use of anticoagulants: Secondary | ICD-10-CM

## 2016-05-09 DIAGNOSIS — I4891 Unspecified atrial fibrillation: Secondary | ICD-10-CM

## 2016-05-09 DIAGNOSIS — I252 Old myocardial infarction: Secondary | ICD-10-CM

## 2016-05-09 DIAGNOSIS — R0609 Other forms of dyspnea: Secondary | ICD-10-CM | POA: Diagnosis not present

## 2016-05-09 DIAGNOSIS — I10 Essential (primary) hypertension: Secondary | ICD-10-CM

## 2016-05-09 DIAGNOSIS — E785 Hyperlipidemia, unspecified: Secondary | ICD-10-CM

## 2016-05-09 DIAGNOSIS — I2510 Atherosclerotic heart disease of native coronary artery without angina pectoris: Secondary | ICD-10-CM

## 2016-05-09 DIAGNOSIS — M810 Age-related osteoporosis without current pathological fracture: Secondary | ICD-10-CM

## 2016-05-09 DIAGNOSIS — Z950 Presence of cardiac pacemaker: Secondary | ICD-10-CM

## 2016-05-09 DIAGNOSIS — K219 Gastro-esophageal reflux disease without esophagitis: Secondary | ICD-10-CM

## 2016-05-09 DIAGNOSIS — Z87891 Personal history of nicotine dependence: Secondary | ICD-10-CM

## 2016-05-09 NOTE — Progress Notes (Signed)
[facility] day:  05/09/16  Chief Complaint: Joan Hull is a 82 y.o. female with stage III marginal zone lymphoma s/p Rituxan who is seen for review of interval PET scan.  HPI:  The patient was last seen in the medical oncology clinic on 04/29/2016.  At that time, she was 1 month after completion of Rituxan.  She denied any B symptoms.  Exam was unremarkable.  Uric acid was 3.6.  We discussed restaging PET scan and maintenance Rituxan.  PET scan on 05/01/2016 revealed significant partial metabolic response. There was residual mildly hypermetabolic left axillary and mediastinal lymphoma, decreased in size and metabolism. Additional previously described sites of hypermetabolic lymphoma had resolved.  There were no new sites of hypermetabolic lymphoma.  There was nonspecific new mild hypermetabolism in the proximal stomach without CT correlate, probably physiologic, cannot exclude gastritis or other etiologies. Further evaluation as clinically warranted.  Symptomatically, she is doing well.  She notes chronic shortness of breath when walking distances.  She states that her heart is fine.   Past Medical History:  Diagnosis Date  . Anxiety   . Arthritis   . Atrial fibrillation ([place])   . CAD (coronary artery disease)   . Cancer [facility]) 2017   lymphoma  . Depression   . Dysrhythmia   . GERD (gastroesophageal reflux disease)   . Heart murmur   . Hx of colonic polyps   . Hyperlipidemia   . Hypertension   . Interstitial cystitis   . Non Q wave myocardial infarction ([place]) 06/27/11   [facility]  . Osteoporosis   . Presence of permanent cardiac pacemaker   . Shortness of breath dyspnea    doe    Past Surgical History:  Procedure Laterality Date  . AXILLARY LYMPH NODE BIOPSY Left 02/06/2016   Procedure: AXILLARY LYMPH NODE BIOPSY;  Surgeon: [name], MD;  Location: [facility] ORS;  Service: General;  Laterality: Left;  . CARDIAC CATHETERIZATION     . CATARACT EXTRACTION Right 2005  . COLONOSCOPY  2007   [name]  . CORONARY ANGIOPLASTY    . CORONARY ARTERY BYPASS GRAFT  1/13   [facility]  . CORONARY STENT PLACEMENT  7/12   RCA with drug eluting stent---[name]  . EYE SURGERY    . INSERT / REPLACE / REMOVE PACEMAKER    . PACEMAKER INSERTION  5/13   [facility]    Family History  Problem Relation Age of Onset  . Heart disease Mother     cad  . Hypertension Mother   . Heart disease Father     heart attack  . Heart disease Sister     half sister MI  . Heart disease Brother     cabg  . Heart disease Brother   . Cancer Brother     BLADDER  . Cancer Sister     BRAIN  . Cancer Sister 65    OVARIAN    Social History:  reports that she quit smoking about 53 years ago. She has never used smokeless tobacco. She reports that she does not drink alcohol or use drugs.  She smoked when she was young (quit in 1964).  She lives alone in [place].  Her son lives in [place], [place]. The patient is alone today.  Allergies:  Allergies  Allergen Reactions  . Aspirin Other (See Comments)    Bladder spasm  . Diazepam     REACTION: worsened bladder problems  . Chlorhexidine  Rash    Skin reacts only to scrub    Current Medications: Current Outpatient Prescriptions  Medication Sig Dispense Refill  . allopurinol (ZYLOPRIM) 300 MG tablet Take 1 tablet (300 mg total) by mouth daily. 30 tablet 1  . atenolol (TENORMIN) 25 MG tablet Take 1 tablet by mouth daily.    Marland Kitchen diltiazem (CARDIZEM CD) 180 MG 24 hr capsule Take 180 mg by mouth daily.   5  . ELIQUIS 2.5 MG TABS tablet Take 2.5 mg by mouth 2 (two) times daily.     . famotidine (PEPCID) 10 MG tablet Take 10 mg by mouth daily.    . metoprolol (LOPRESSOR) 50 MG tablet TAKE ONE-HALF TABLET BY MOUTH TWICE DAILY 90 tablet 3  . Multiple Vitamins-Minerals (CENTRUM SILVER 50+WOMEN PO) Take 1 tablet by mouth.    Marland Kitchen PARoxetine (PAXIL) 10 MG tablet Take 1 tablet (10 mg total) by mouth daily. 90  tablet 3  . Probiotic Product (PROBIOTIC DAILY PO) Take 1 capsule by mouth daily.     Marland Kitchen torsemide (DEMADEX) 20 MG tablet TAKE ONE-HALF TABLET BY MOUTH ONCE DAILY 45 tablet 0  . vitamin B-12 (CYANOCOBALAMIN) 100 MCG tablet Take 100 mcg by mouth daily.    Marland Kitchen zolpidem (AMBIEN) 5 MG tablet TAKE ONE TABLET BY MOUTH AT BEDTIME AS NEEDED 90 tablet 0   No current facility-administered medications for this visit.     Review of Systems:  GENERAL:  Feels "fine". No fevers.  No sweats.  Weight down 2 pounds. PERFORMANCE STATUS (ECOG):  1 HEENT:  No visual changes, runny nose, sore throat, mouth sores or tenderness. Lungs: Shortness of breath with exertion (chronic).  No cough.  No hemoptysis. Cardiac:  Atrial fibrillation.  No chest pain, palpitations, orthopnea, or PND.  Sees cardiologist tomorrow. GI:  Appetite good.  No nausea, vomiting, diarrhea, constipation, melena or hematochezia. GU:  No urgency, frequency, dysuria, or hematuria. Musculoskeletal:  No back pain.  No joint pain.  No muscle tenderness. Extremities:  Hands stiff secondary to arthritis.  No pain or swelling. Skin:  No rashes or skin changes. Neuro:  No headache, numbness or weakness, balance or coordination issues. Endocrine:  No diabetes, thyroid issues, hot flashes or night sweats. Psych:  No mood changes, depression or anxiety. Pain:  No focal pain. Review of systems:  All other systems reviewed and found to be negative.  Physical Exam: Blood pressure (!) 148/76, pulse 64, temperature 98.1 F (36.7 C), temperature source Oral, height 4\' 11"  (1.499 m), weight 102 lb 8.2 oz (46.5 kg), SpO2 97 %. GENERAL:  Well developed, well nourished, sitting comfortably in the exam room in no acute distress. MENTAL STATUS:  Alert and oriented to person, place and time. HEAD:  Dark brown wavy hair.  Normocephalic, atraumatic, face symmetric, no Cushingoid features. EYES:  Glasses.  Blue eyes s/p cataract surgery on the right.  Pupils equal  round and reactive to light and accomodation.  No conjunctivitis or scleral icterus. ENT:  Oropharynx clear without lesion.  Tongue normal. Mucous membranes moist.  RESPIRATORY:  Clear to auscultation without rales, wheezes or rhonchi. CARDIOVASCULAR:  Irregular rhythm without murmur, rub or gallop. CHEST:  Well healed midline sternotomy incision.  Pacemaker. ABDOMEN:  Soft, non-tender, with active bowel sounds, and no hepatosplenomegaly.  No masses. SKIN:  No rashes, ulcers or lesions. EXTREMITIES: Arthritis changes in hands.  Left ganglion cyst.  No edema, no skin discoloration or tenderness.  No palpable cords. LYMPH NODES: No palpable cervical, supraclavicular, or  inguinal adenopathy  NEUROLOGICAL: Unremarkable. PSYCH:  Appropriate.   No visits with results within 3 Day(s) from this visit.  Latest known visit with results is:  Hospital Outpatient Visit on 05/01/2016  Component Date Value Ref Range Status  . Glucose-Capillary 05/01/2016 96  65 - 99 mg/dL Final    Assessment:  ARITA SAFFOLD is a 82 y.o. female with a stage III marginal zone lymphoma.  She presented with a left axillary mass.  Biopsy on 01/15/2016 revealed an atypical lymphoid proliferation highly suspicious for non-Hodgkin's lymphoma.  There were diffuse sheets of B-cells (CD20 and CD79a +; CD5 and CD10-).  In addition, there was CD138 + abundant plasmacytoid cells with lambda light chain restriction. The lambda staining was apparently present in lymphoid cells.   Lymph node excisional biopsy on 02/06/2016 revealed mature B-cell lymphoma with plasmacytic differentiation, best classified as a marginal zone lymphoma.  PET scan on 01/31/2016 revealed active lymphoma within the chest, abdomen, and pelvis.  The largest lymph node was 1.7 cm.  Hepatitis B core antibody IgM, hepatitis B surface antigen, and hepatitis C antibody were negative on 01/21/2016.  Hepatitis B core antibody was negative on 02/22/2016.  G6PD assay was  negative on 02/22/2016.  She developed acute renal insufficiency after initiation of Septra.  Renal ultrasound on 03/04/2016 revealed no evidence of hydronephrosis.  Baseline creatinine is 0.91 - 1.13 (CrCl 44 - 54 ml/min).  Creatinine was 1.54 (CrCl 29 ml/min) on 03/04/2016 and improved to baseline off Septra.  She received weekly Rituxan x 4 (03/11/2016 - 04/01/2016).  She tolerated her infusions well.  PET scan on 05/01/2016 revealed significant partial metabolic response. There was residual mildly hypermetabolic left axillary and mediastinal lymphoma, decreased in size and metabolism. Additional previously described sites of hypermetabolic lymphoma had resolved.   Symptomatically, she denies any B symptoms.  Exam is unremarkable.  Uric acid is 3.6.  Plan: 1.  Review PET scan. 2.  Discuss maintenance Rituxan every 3 months x 2 years.  Numerous questions asked and answered. 3.  Discontinue allopurinol. 4.  RTC on 05/27/2016 for MD assessment, labs (CBC with diff, CMP, LDH, uric acid), and initiation of maintenance Rituxan.   Lequita Asal, MD  05/09/2016, 11:09 AM

## 2016-05-09 NOTE — Progress Notes (Signed)
Patient here for results and follow up. Patient has been feeling tired and short of breath on exertion.

## 2016-05-13 ENCOUNTER — Telehealth: Payer: Self-pay | Admitting: *Deleted

## 2016-05-13 NOTE — Telephone Encounter (Signed)
Pt called and wants me to explain her pet scan results with kidney cyst, and diverticulosis. I called pt and let her know that diverticulosis is not an active infection but she has had one in the past. I told her that lots of people have  Benign cyst in their kidneys and unless she is having trouble urinating, pain or burning or gets frequent UTI she should be ok with the cyst. She states that she has bloating in her stomach. I told her that she should mention to her PCP because the report possible gastritis in the stomach area and her primary care MD would probably want her to see GI doctor and she will let PCP know.

## 2016-05-19 DIAGNOSIS — R198 Other specified symptoms and signs involving the digestive system and abdomen: Secondary | ICD-10-CM | POA: Diagnosis not present

## 2016-05-19 DIAGNOSIS — R140 Abdominal distension (gaseous): Secondary | ICD-10-CM | POA: Diagnosis not present

## 2016-05-21 ENCOUNTER — Other Ambulatory Visit: Payer: Self-pay | Admitting: Internal Medicine

## 2016-05-27 ENCOUNTER — Inpatient Hospital Stay: Payer: PPO | Attending: Hematology and Oncology

## 2016-05-27 ENCOUNTER — Inpatient Hospital Stay (HOSPITAL_BASED_OUTPATIENT_CLINIC_OR_DEPARTMENT_OTHER): Payer: PPO | Admitting: Hematology and Oncology

## 2016-05-27 ENCOUNTER — Inpatient Hospital Stay: Payer: PPO

## 2016-05-27 ENCOUNTER — Other Ambulatory Visit: Payer: Self-pay | Admitting: Hematology and Oncology

## 2016-05-27 ENCOUNTER — Other Ambulatory Visit: Payer: Self-pay | Admitting: *Deleted

## 2016-05-27 VITALS — BP 142/72 | HR 67 | Temp 95.6°F | Resp 18 | Wt 102.5 lb

## 2016-05-27 DIAGNOSIS — Z9221 Personal history of antineoplastic chemotherapy: Secondary | ICD-10-CM | POA: Diagnosis not present

## 2016-05-27 DIAGNOSIS — Z7901 Long term (current) use of anticoagulants: Secondary | ICD-10-CM | POA: Diagnosis not present

## 2016-05-27 DIAGNOSIS — C8584 Other specified types of non-Hodgkin lymphoma, lymph nodes of axilla and upper limb: Secondary | ICD-10-CM

## 2016-05-27 DIAGNOSIS — I4891 Unspecified atrial fibrillation: Secondary | ICD-10-CM | POA: Insufficient documentation

## 2016-05-27 DIAGNOSIS — I10 Essential (primary) hypertension: Secondary | ICD-10-CM | POA: Insufficient documentation

## 2016-05-27 DIAGNOSIS — E785 Hyperlipidemia, unspecified: Secondary | ICD-10-CM | POA: Insufficient documentation

## 2016-05-27 DIAGNOSIS — K219 Gastro-esophageal reflux disease without esophagitis: Secondary | ICD-10-CM | POA: Diagnosis not present

## 2016-05-27 DIAGNOSIS — C8580 Other specified types of non-Hodgkin lymphoma, unspecified site: Secondary | ICD-10-CM

## 2016-05-27 DIAGNOSIS — E790 Hyperuricemia without signs of inflammatory arthritis and tophaceous disease: Secondary | ICD-10-CM

## 2016-05-27 DIAGNOSIS — F419 Anxiety disorder, unspecified: Secondary | ICD-10-CM | POA: Diagnosis not present

## 2016-05-27 DIAGNOSIS — I2510 Atherosclerotic heart disease of native coronary artery without angina pectoris: Secondary | ICD-10-CM | POA: Diagnosis not present

## 2016-05-27 DIAGNOSIS — Z87891 Personal history of nicotine dependence: Secondary | ICD-10-CM | POA: Insufficient documentation

## 2016-05-27 DIAGNOSIS — F329 Major depressive disorder, single episode, unspecified: Secondary | ICD-10-CM | POA: Diagnosis not present

## 2016-05-27 DIAGNOSIS — Z950 Presence of cardiac pacemaker: Secondary | ICD-10-CM | POA: Diagnosis not present

## 2016-05-27 DIAGNOSIS — Z5112 Encounter for antineoplastic immunotherapy: Secondary | ICD-10-CM | POA: Diagnosis not present

## 2016-05-27 DIAGNOSIS — I252 Old myocardial infarction: Secondary | ICD-10-CM | POA: Insufficient documentation

## 2016-05-27 DIAGNOSIS — Z79899 Other long term (current) drug therapy: Secondary | ICD-10-CM | POA: Diagnosis not present

## 2016-05-27 DIAGNOSIS — M1990 Unspecified osteoarthritis, unspecified site: Secondary | ICD-10-CM | POA: Diagnosis not present

## 2016-05-27 LAB — COMPREHENSIVE METABOLIC PANEL
ALT: 14 U/L (ref 14–54)
AST: 24 U/L (ref 15–41)
Albumin: 4.3 g/dL (ref 3.5–5.0)
Alkaline Phosphatase: 110 U/L (ref 38–126)
Anion gap: 11 (ref 5–15)
BUN: 25 mg/dL — ABNORMAL HIGH (ref 6–20)
CO2: 25 mmol/L (ref 22–32)
Calcium: 9.2 mg/dL (ref 8.9–10.3)
Chloride: 102 mmol/L (ref 101–111)
Creatinine, Ser: 1.01 mg/dL — ABNORMAL HIGH (ref 0.44–1.00)
GFR calc Af Amer: 56 mL/min — ABNORMAL LOW (ref >60)
GFR calc non Af Amer: 48 mL/min — ABNORMAL LOW (ref >60)
Glucose, Bld: 106 mg/dL — ABNORMAL HIGH (ref 65–99)
Potassium: 4.3 mmol/L (ref 3.5–5.1)
Sodium: 138 mmol/L (ref 135–145)
Total Bilirubin: 0.7 mg/dL (ref 0.3–1.2)
Total Protein: 7 g/dL (ref 6.5–8.1)

## 2016-05-27 LAB — CBC WITH DIFFERENTIAL/PLATELET
Basophils Absolute: 0 10*3/uL (ref 0–0.1)
Basophils Relative: 1 %
Eosinophils Absolute: 0.3 10*3/uL (ref 0–0.7)
Eosinophils Relative: 5 %
HCT: 38.1 % (ref 35.0–47.0)
Hemoglobin: 13.2 g/dL (ref 12.0–16.0)
Lymphocytes Relative: 24 %
Lymphs Abs: 1.4 10*3/uL (ref 1.0–3.6)
MCH: 32.4 pg (ref 26.0–34.0)
MCHC: 34.6 g/dL (ref 32.0–36.0)
MCV: 93.8 fL (ref 80.0–100.0)
Monocytes Absolute: 0.9 10*3/uL (ref 0.2–0.9)
Monocytes Relative: 16 %
Neutro Abs: 3.1 10*3/uL (ref 1.4–6.5)
Neutrophils Relative %: 54 %
Platelets: 205 10*3/uL (ref 150–440)
RBC: 4.06 MIL/uL (ref 3.80–5.20)
RDW: 14.3 % (ref 11.5–14.5)
WBC: 5.7 10*3/uL (ref 3.6–11.0)

## 2016-05-27 LAB — URIC ACID: Uric Acid, Serum: 7.3 mg/dL — ABNORMAL HIGH (ref 2.3–6.6)

## 2016-05-27 LAB — LACTATE DEHYDROGENASE: LDH: 150 U/L (ref 98–192)

## 2016-05-27 NOTE — Progress Notes (Signed)
[facility] day:  05/27/16  Chief Complaint: Joan Hull is a 82 y.o. female with stage III marginal zone lymphoma s/p Rituxan who is seen for for assessment prior to cycle #1 maintenance Rituxan.  HPI:  The patient was last seen in the medical oncology clinic on 05/09/2016.  At that time, PET scan revealed a significant partial metabolic response.  We discussed maintenance Rituxan.  During the interim, she has remained short winded.  Shew denies any chest pain or palpitations.  She denies any fever sweats or weight loss.  She denies any adenopathy.  She has been off her allopurinol.   Past Medical History:  Diagnosis Date  . Anxiety   . Arthritis   . Atrial fibrillation ([place])   . CAD (coronary artery disease)   . Cancer [facility]) 2017   lymphoma  . Depression   . Dysrhythmia   . GERD (gastroesophageal reflux disease)   . Heart murmur   . Hx of colonic polyps   . Hyperlipidemia   . Hypertension   . Interstitial cystitis   . Non Q wave myocardial infarction ([place]) 06/27/11   [facility]  . Osteoporosis   . Presence of permanent cardiac pacemaker   . Shortness of breath dyspnea    doe    Past Surgical History:  Procedure Laterality Date  . AXILLARY LYMPH NODE BIOPSY Left 02/06/2016   Procedure: AXILLARY LYMPH NODE BIOPSY;  Surgeon: [name], MD;  Location: [facility] ORS;  Service: General;  Laterality: Left;  . CARDIAC CATHETERIZATION    . CATARACT EXTRACTION Right 2005  . COLONOSCOPY  2007   [name]  . CORONARY ANGIOPLASTY    . CORONARY ARTERY BYPASS GRAFT  1/13   [facility]  . CORONARY STENT PLACEMENT  7/12   RCA with drug eluting stent---[name]  . EYE SURGERY    . INSERT / REPLACE / REMOVE PACEMAKER    . PACEMAKER INSERTION  5/13   [facility]    Family History  Problem Relation Age of Onset  . Heart disease Mother     cad  . Hypertension Mother   . Heart disease Father     heart attack  . Heart disease Sister      half sister MI  . Heart disease Brother     cabg  . Heart disease Brother   . Cancer Brother     BLADDER  . Cancer Sister     BRAIN  . Cancer Sister 90    OVARIAN    Social History:  reports that she quit smoking about 53 years ago. She has never used smokeless tobacco. She reports that she does not drink alcohol or use drugs.  She smoked when she was young (quit in 1964).  She lives alone in [place].  Her son lives in [place], [place]. The patient is alone today.  Allergies:  Allergies  Allergen Reactions  . Aspirin Other (See Comments)    Bladder spasm  . Diazepam     REACTION: worsened bladder problems  . Chlorhexidine Rash    Skin reacts only to scrub    Current Medications: Current Outpatient Prescriptions  Medication Sig Dispense Refill  . allopurinol (ZYLOPRIM) 300 MG tablet Take 1 tablet (300 mg total) by mouth daily. 30 tablet 1  . atenolol (TENORMIN) 25 MG tablet Take 1 tablet by mouth daily.    Marland Kitchen diltiazem (CARDIZEM CD) 180 MG 24 hr capsule Take 180 mg by mouth daily.  5  . ELIQUIS 2.5 MG TABS tablet Take 2.5 mg by mouth 2 (two) times daily.     . famotidine (PEPCID) 10 MG tablet Take 10 mg by mouth daily.    . metoprolol (LOPRESSOR) 50 MG tablet TAKE ONE-HALF TABLET BY MOUTH TWICE DAILY 90 tablet 3  . Multiple Vitamins-Minerals (CENTRUM SILVER 50+WOMEN PO) Take 1 tablet by mouth.    Marland Kitchen PARoxetine (PAXIL) 10 MG tablet Take 1 tablet (10 mg total) by mouth daily. 90 tablet 3  . Probiotic Product (PROBIOTIC DAILY PO) Take 1 capsule by mouth daily.     Marland Kitchen torsemide (DEMADEX) 20 MG tablet TAKE ONE-HALF TABLET BY MOUTH ONCE DAILY 45 tablet 0  . vitamin B-12 (CYANOCOBALAMIN) 100 MCG tablet Take 100 mcg by mouth daily.    Marland Kitchen zolpidem (AMBIEN) 5 MG tablet TAKE ONE TABLET BY MOUTH AT BEDTIME AS NEEDED 90 tablet 0   No current facility-administered medications for this visit.     Review of Systems:  GENERAL:  Feels "ok".  No fevers.  No sweats.  Weight  stable. PERFORMANCE STATUS (ECOG):  1 HEENT:  No visual changes, runny nose, sore throat, mouth sores or tenderness. Lungs: Shortness of breath with exertion.  No cough.  No hemoptysis. Cardiac:  Atrial fibrillation.  No chest pain, palpitations, orthopnea, or PND.  Sees cardiologist tomorrow. GI:  Appetite good.  No nausea, vomiting, diarrhea, constipation, melena or hematochezia. GU:  No urgency, frequency, dysuria, or hematuria. Musculoskeletal:  No back pain.  No joint pain.  No muscle tenderness. Extremities:  Arthritis.  No lower extremity pain or swelling. Skin:  No rashes or skin changes. Neuro:  No headache, numbness or weakness, balance or coordination issues. Endocrine:  No diabetes, thyroid issues, hot flashes or night sweats. Psych:  No mood changes, depression or anxiety. Pain:  No focal pain. Review of systems:  All other systems reviewed and found to be negative.  Physical Exam: Blood pressure (!) 142/72, pulse 67, temperature (!) 95.6 F (35.3 C), temperature source Tympanic, resp. rate 18, weight 102 lb 8.2 oz (46.5 kg). GENERAL:  Well developed, well nourished, sitting comfortably in the exam room in no acute distress. MENTAL STATUS:  Alert and oriented to person, place and time. HEAD:  Dark brown wavy hair.  Normocephalic, atraumatic, face symmetric, no Cushingoid features. EYES:  Glasses.  Blue eyes s/p cataract surgery on the right.  Pupils equal round and reactive to light and accomodation.  No conjunctivitis or scleral icterus. ENT:  Oropharynx clear without lesion.  Tongue normal. Mucous membranes moist.  RESPIRATORY:  Clear to auscultation without rales, wheezes or rhonchi. CARDIOVASCULAR:  Irregular rhythm without murmur, rub or gallop. CHEST:  Well healed midline sternotomy incision.  Pacemaker. ABDOMEN:  Soft, non-tender, with active bowel sounds, and no hepatosplenomegaly.  No masses. SKIN:  No rashes, ulcers or lesions. EXTREMITIES: Arthritis changes in  hands.  Left ganglion cyst.  No edema, no skin discoloration or tenderness.  No palpable cords. LYMPH NODES: No palpable cervical, supraclavicular, axillary or inguinal adenopathy  NEUROLOGICAL: Unremarkable. PSYCH:  Appropriate.   Appointment on 05/27/2016  Component Date Value Ref Range Status  . WBC 05/27/2016 5.7  3.6 - 11.0 K/uL Final  . RBC 05/27/2016 4.06  3.80 - 5.20 MIL/uL Final  . Hemoglobin 05/27/2016 13.2  12.0 - 16.0 g/dL Final  . HCT 05/27/2016 38.1  35.0 - 47.0 % Final  . MCV 05/27/2016 93.8  80.0 - 100.0 fL Final  . [place] 05/27/2016 32.4  26.0 - 34.0 pg Final  . MCHC 05/27/2016 34.6  32.0 - 36.0 g/dL Final  . RDW 05/27/2016 14.3  11.5 - 14.5 % Final  . Platelets 05/27/2016 205  150 - 440 K/uL Final  . Neutrophils Relative % 05/27/2016 54  % Final  . Neutro Abs 05/27/2016 3.1  1.4 - 6.5 K/uL Final  . Lymphocytes Relative 05/27/2016 24  % Final  . Lymphs Abs 05/27/2016 1.4  1.0 - 3.6 K/uL Final  . Monocytes Relative 05/27/2016 16  % Final  . Monocytes Absolute 05/27/2016 0.9  0.2 - 0.9 K/uL Final  . Eosinophils Relative 05/27/2016 5  % Final  . Eosinophils Absolute 05/27/2016 0.3  0 - 0.7 K/uL Final  . Basophils Relative 05/27/2016 1  % Final  . Basophils Absolute 05/27/2016 0.0  0 - 0.1 K/uL Final  . Sodium 05/27/2016 138  135 - 145 mmol/L Final  . Potassium 05/27/2016 4.3  3.5 - 5.1 mmol/L Final  . Chloride 05/27/2016 102  101 - 111 mmol/L Final  . CO2 05/27/2016 25  22 - 32 mmol/L Final  . Glucose, Bld 05/27/2016 106* 65 - 99 mg/dL Final  . BUN 05/27/2016 25* 6 - 20 mg/dL Final  . Creatinine, Ser 05/27/2016 1.01* 0.44 - 1.00 mg/dL Final  . Calcium 05/27/2016 9.2  8.9 - 10.3 mg/dL Final  . Total Protein 05/27/2016 7.0  6.5 - 8.1 g/dL Final  . Albumin 05/27/2016 4.3  3.5 - 5.0 g/dL Final  . AST 05/27/2016 24  15 - 41 U/L Final  . ALT 05/27/2016 14  14 - 54 U/L Final  . Alkaline Phosphatase 05/27/2016 110  38 - 126 U/L Final  . Total Bilirubin 05/27/2016 0.7  0.3 -  1.2 mg/dL Final  . GFR calc non Af Amer 05/27/2016 48* >60 mL/min Final  . GFR calc Af Amer 05/27/2016 56* >60 mL/min Final   Comment: (NOTE) The eGFR has been calculated using the CKD EPI equation. This calculation has not been validated in all clinical situations. eGFR's persistently <60 mL/min signify possible Chronic Kidney Disease.   . Anion gap 05/27/2016 11  5 - 15 Final  . LDH 05/27/2016 150  98 - 192 U/L Final  . Uric Acid, Serum 05/27/2016 7.3* 2.3 - 6.6 mg/dL Final    Assessment:  Joan Hull is a 82 y.o. female with a stage III marginal zone lymphoma.  She presented with a left axillary mass.  Biopsy on 01/15/2016 revealed an atypical lymphoid proliferation highly suspicious for non-Hodgkin's lymphoma.  There were diffuse sheets of B-cells (CD20 and CD79a +; CD5 and CD10-).  In addition, there was CD138 + abundant plasmacytoid cells with lambda light chain restriction. The lambda staining was apparently present in lymphoid cells.   Lymph node excisional biopsy on 02/06/2016 revealed mature B-cell lymphoma with plasmacytic differentiation, best classified as a marginal zone lymphoma.  PET scan on 01/31/2016 revealed active lymphoma within the chest, abdomen, and pelvis.  The largest lymph node was 1.7 cm.  Hepatitis B core antibody IgM, hepatitis B surface antigen, and hepatitis C antibody were negative on 01/21/2016.  Hepatitis B core antibody was negative on 02/22/2016.  G6PD assay was negative on 02/22/2016.  She developed acute renal insufficiency after initiation of Septra.  Renal ultrasound on 03/04/2016 revealed no evidence of hydronephrosis.  Baseline creatinine is 0.91 - 1.13 (CrCl 44 - 54 ml/min).  Creatinine was 1.54 (CrCl 29 ml/min) on 03/04/2016 and improved to baseline off Septra.  She received  weekly Rituxan x 4 (03/11/2016 - 04/01/2016).  She tolerated her infusions well.  PET scan on 05/01/2016 revealed significant partial metabolic response. There was  residual mildly hypermetabolic left axillary and mediastinal lymphoma, decreased in size and metabolism. Additional previously described sites of hypermetabolic lymphoma had resolved.   Symptomatically, she denies any B symptoms.  She has chronic shortness of breath.  Exam is unremarkable.  Uric acid is 7.3.  Plan: 1.  Labs today:  CBC with diff, CMP, LDH, uric acid. 2.  Discuss postponing cycle #1 maintenance Rituxan until next week secondary to increased uric acid. 3.  Restart allopurinol 300 mg a day. 4.  Review plan for maintenance Rituxan every 3 months x 2 years.  5.  RTC in 1 week for labs (CBC with diff, CMP, uric acid) and cycle #1 maintenance Rituxan 6.  RTC in 13 weeks for MD assessment, labs (CBC with diff, CMP, LDH, uric acid), cycle #2 maintenance RItuxan.   Lequita Asal, MD  05/27/2016, 10:14 AM

## 2016-05-27 NOTE — Progress Notes (Signed)
Patient states she continues to be SOB.  Has to rest between activities.  Patient has questions about recent PET scan.

## 2016-06-03 ENCOUNTER — Inpatient Hospital Stay: Payer: PPO

## 2016-06-03 ENCOUNTER — Telehealth: Payer: Self-pay

## 2016-06-03 ENCOUNTER — Other Ambulatory Visit: Payer: Self-pay | Admitting: Hematology and Oncology

## 2016-06-03 ENCOUNTER — Other Ambulatory Visit: Payer: Self-pay | Admitting: *Deleted

## 2016-06-03 VITALS — BP 152/65 | HR 59 | Temp 96.2°F | Resp 18

## 2016-06-03 DIAGNOSIS — C8580 Other specified types of non-Hodgkin lymphoma, unspecified site: Secondary | ICD-10-CM

## 2016-06-03 DIAGNOSIS — Z5112 Encounter for antineoplastic immunotherapy: Secondary | ICD-10-CM | POA: Diagnosis not present

## 2016-06-03 LAB — COMPREHENSIVE METABOLIC PANEL
ALT: 14 U/L (ref 14–54)
AST: 26 U/L (ref 15–41)
Albumin: 4.4 g/dL (ref 3.5–5.0)
Alkaline Phosphatase: 111 U/L (ref 38–126)
Anion gap: 10 (ref 5–15)
BUN: 23 mg/dL — ABNORMAL HIGH (ref 6–20)
CO2: 28 mmol/L (ref 22–32)
Calcium: 9.8 mg/dL (ref 8.9–10.3)
Chloride: 102 mmol/L (ref 101–111)
Creatinine, Ser: 0.98 mg/dL (ref 0.44–1.00)
GFR calc Af Amer: 58 mL/min — ABNORMAL LOW (ref >60)
GFR calc non Af Amer: 50 mL/min — ABNORMAL LOW (ref >60)
Glucose, Bld: 98 mg/dL (ref 65–99)
Potassium: 4.7 mmol/L (ref 3.5–5.1)
Sodium: 140 mmol/L (ref 135–145)
Total Bilirubin: 0.8 mg/dL (ref 0.3–1.2)
Total Protein: 7.4 g/dL (ref 6.5–8.1)

## 2016-06-03 LAB — CBC WITH DIFFERENTIAL/PLATELET
Basophils Absolute: 0 10*3/uL (ref 0–0.1)
Basophils Relative: 1 %
Eosinophils Absolute: 0.3 10*3/uL (ref 0–0.7)
Eosinophils Relative: 5 %
HCT: 38.8 % (ref 35.0–47.0)
Hemoglobin: 13.4 g/dL (ref 12.0–16.0)
Lymphocytes Relative: 25 %
Lymphs Abs: 1.5 10*3/uL (ref 1.0–3.6)
MCH: 32.4 pg (ref 26.0–34.0)
MCHC: 34.5 g/dL (ref 32.0–36.0)
MCV: 94.1 fL (ref 80.0–100.0)
Monocytes Absolute: 1.2 10*3/uL — ABNORMAL HIGH (ref 0.2–0.9)
Monocytes Relative: 20 %
Neutro Abs: 2.9 10*3/uL (ref 1.4–6.5)
Neutrophils Relative %: 49 %
Platelets: 214 10*3/uL (ref 150–440)
RBC: 4.13 MIL/uL (ref 3.80–5.20)
RDW: 14 % (ref 11.5–14.5)
WBC: 5.9 10*3/uL (ref 3.6–11.0)

## 2016-06-03 LAB — URIC ACID: Uric Acid, Serum: 3.1 mg/dL (ref 2.3–6.6)

## 2016-06-03 MED ORDER — SODIUM CHLORIDE 0.9 % IV SOLN
375.0000 mg/m2 | Freq: Once | INTRAVENOUS | Status: AC
  Administered 2016-06-03: 500 mg via INTRAVENOUS
  Filled 2016-06-03: qty 50

## 2016-06-03 MED ORDER — SODIUM CHLORIDE 0.9 % IV SOLN
Freq: Once | INTRAVENOUS | Status: AC
  Administered 2016-06-03: 11:00:00 via INTRAVENOUS
  Filled 2016-06-03: qty 1000

## 2016-06-03 MED ORDER — SODIUM CHLORIDE 0.9 % IV SOLN: 375.0000 mg/m2 | Freq: Once | INTRAVENOUS | Status: DC

## 2016-06-03 MED ORDER — ALLOPURINOL 300 MG PO TABS: 300.0000 mg | ORAL_TABLET | Freq: Every day | ORAL | 1 refills | Status: DC

## 2016-06-03 MED ORDER — ALLOPURINOL 300 MG PO TABS: 300.0000 mg | ORAL_TABLET | Freq: Every day | ORAL | 2 refills | Status: DC

## 2016-06-03 MED ORDER — ACETAMINOPHEN 325 MG PO TABS
650.0000 mg | ORAL_TABLET | Freq: Once | ORAL | Status: AC
  Administered 2016-06-03: 650 mg via ORAL
  Filled 2016-06-03: qty 2

## 2016-06-03 MED ORDER — DIPHENHYDRAMINE HCL 25 MG PO CAPS
25.0000 mg | ORAL_CAPSULE | Freq: Once | ORAL | Status: AC
  Administered 2016-06-03: 25 mg via ORAL
  Filled 2016-06-03: qty 1

## 2016-06-03 NOTE — Telephone Encounter (Signed)
Sent electronic rx to refill allopurinol and then when I called pt she wants 90 day supply, got permission from [facility] and resent 90 day supply with notes to pharmacy to disregard the 30 day supply and go with 90 day supply

## 2016-06-03 NOTE — Telephone Encounter (Signed)
Patient did start taking Allopurinol again.  She has 8 days left in her current bottle and she needs to know if Dr. Mike Gip would like her to continue.

## 2016-06-03 NOTE — Telephone Encounter (Signed)
  Please refill her allopurinol  M

## 2016-06-08 ENCOUNTER — Encounter: Payer: Self-pay | Admitting: Hematology and Oncology

## 2016-06-17 ENCOUNTER — Other Ambulatory Visit: Payer: Self-pay | Admitting: Internal Medicine

## 2016-06-17 NOTE — Telephone Encounter (Signed)
Left refill on voice mail at pharmacy  

## 2016-06-17 NOTE — Telephone Encounter (Signed)
Last filled 03-20-16 #90 Last OV 01-03-16 Next OV 06-23-16  Forward to [facility] in Dr Alla German absence

## 2016-06-17 NOTE — Telephone Encounter (Signed)
Ok to phone in Ambien 

## 2016-06-23 ENCOUNTER — Encounter: Payer: Self-pay | Admitting: Internal Medicine

## 2016-06-23 ENCOUNTER — Ambulatory Visit (INDEPENDENT_AMBULATORY_CARE_PROVIDER_SITE_OTHER): Payer: PPO | Admitting: Internal Medicine

## 2016-06-23 VITALS — BP 130/68 | HR 60 | Temp 97.7°F | Ht <= 58 in | Wt 101.0 lb

## 2016-06-23 DIAGNOSIS — Z7189 Other specified counseling: Secondary | ICD-10-CM

## 2016-06-23 DIAGNOSIS — F39 Unspecified mood [affective] disorder: Secondary | ICD-10-CM | POA: Diagnosis not present

## 2016-06-23 DIAGNOSIS — Z0000 Encounter for general adult medical examination without abnormal findings: Secondary | ICD-10-CM

## 2016-06-23 DIAGNOSIS — I480 Paroxysmal atrial fibrillation: Secondary | ICD-10-CM | POA: Diagnosis not present

## 2016-06-23 DIAGNOSIS — I25119 Atherosclerotic heart disease of native coronary artery with unspecified angina pectoris: Secondary | ICD-10-CM | POA: Diagnosis not present

## 2016-06-23 DIAGNOSIS — C8580 Other specified types of non-Hodgkin lymphoma, unspecified site: Secondary | ICD-10-CM

## 2016-06-23 NOTE — Assessment & Plan Note (Signed)
See social history 

## 2016-06-23 NOTE — Progress Notes (Signed)
Subjective:    Patient ID: Joan Hull, female    DOB: [birth year], 82 y.o.   MRN: [id number]  HPI Here for Medicare wellness and follow up of chronic health conditions Reviewed form and advanced directives Reviewed other doctors No alcohol or tobacco Doesn't really do set exercise but continues to do her instrumental ADLs No falls Vision is okay--increasing cataract on left (may need to consider surgery) Mild hearing loss No major memory issues  Reviewed node resection which led to the lymphoma diagnosis No other surgery in past year  Feels really good in the past few weeks Wonders if the lymphoma was wearing on her even before it was found Doesn't really notice the DOE anymore  Still getting rituxan every 3 months Continues with Dr Mike Gip Did have mild abnormalities in the PET scan  No heart problems Will rarely have sensation of heart going faster--thinks the atenolol is controlling it Continues on the eliquis No chest pain Breathing is better No dizziness or syncope  No regular anxiety--no change from her usual Still needs the ambien nightly to sleep No AM grogginess, etc  Uses OTC famotidine Saw Dr Elliott's PA due to bloating Symptoms better with famotidine 20 bid This might be related to the torsemide (which she uses daily except Sunday)  Current Outpatient Prescriptions on File Prior to Visit  Medication Sig Dispense Refill  . allopurinol (ZYLOPRIM) 300 MG tablet Take 1 tablet (300 mg total) by mouth daily. 90 tablet 2  . atenolol (TENORMIN) 25 MG tablet Take 1 tablet by mouth daily.    Marland Kitchen diltiazem (CARDIZEM CD) 180 MG 24 hr capsule Take 180 mg by mouth daily.   5  . ELIQUIS 2.5 MG TABS tablet Take 2.5 mg by mouth 2 (two) times daily.     . metoprolol (LOPRESSOR) 50 MG tablet TAKE ONE-HALF TABLET BY MOUTH TWICE DAILY 90 tablet 3  . Multiple Vitamins-Minerals (CENTRUM SILVER 50+WOMEN PO) Take 1 tablet by mouth.    Marland Kitchen PARoxetine (PAXIL) 10 MG tablet  Take 1 tablet (10 mg total) by mouth daily. 90 tablet 3  . Probiotic Product (PROBIOTIC DAILY PO) Take 1 capsule by mouth daily.     Marland Kitchen torsemide (DEMADEX) 20 MG tablet TAKE ONE-HALF TABLET BY MOUTH ONCE DAILY 45 tablet 0  . vitamin B-12 (CYANOCOBALAMIN) 100 MCG tablet Take 100 mcg by mouth daily.    Marland Kitchen zolpidem (AMBIEN) 5 MG tablet TAKE ONE TABLET BY MOUTH AT BEDTIME AS NEEDED 90 tablet 0   No current facility-administered medications on file prior to visit.     Allergies  Allergen Reactions  . Aspirin Other (See Comments)    Bladder spasm  . Diazepam     REACTION: worsened bladder problems  . Chlorhexidine Rash    Skin reacts only to scrub    Past Medical History:  Diagnosis Date  . Anxiety   . Arthritis   . Atrial fibrillation ([place])   . CAD (coronary artery disease)   . Cancer [facility]) 2017   lymphoma  . Depression   . Dysrhythmia   . GERD (gastroesophageal reflux disease)   . Heart murmur   . Hx of colonic polyps   . Hyperlipidemia   . Hypertension   . Interstitial cystitis   . Non Q wave myocardial infarction ([place]) 06/27/11   [facility]  . Osteoporosis   . Presence of permanent cardiac pacemaker   . Shortness of breath dyspnea    doe    Past Surgical History:  Procedure Laterality Date  . AXILLARY LYMPH NODE BIOPSY Left 02/06/2016   Procedure: AXILLARY LYMPH NODE BIOPSY;  Surgeon: [name], MD;  Location: [facility] ORS;  Service: General;  Laterality: Left;  . CARDIAC CATHETERIZATION    . CATARACT EXTRACTION Right 2005  . COLONOSCOPY  2007   [name]  . CORONARY ANGIOPLASTY    . CORONARY ARTERY BYPASS GRAFT  1/13   [facility]  . CORONARY STENT PLACEMENT  7/12   RCA with drug eluting stent---[name]  . EYE SURGERY    . INSERT / REPLACE / REMOVE PACEMAKER    . PACEMAKER INSERTION  5/13   [facility]    Family History  Problem Relation Age of Onset  . Heart disease Mother     cad  . Hypertension Mother   . Heart disease Sister     half sister MI  . Cancer  Sister     BRAIN  . Heart disease Father     heart attack  . Heart disease Brother     cabg  . Heart disease Brother   . Cancer Brother     BLADDER  . Cancer Sister 30    OVARIAN    Social History   Social History  . Marital status: Widowed    Spouse name: N/A  . Number of children: 1  . Years of education: N/A   Occupational History  . RETIRED ([employer])    Social History Main Topics  . Smoking status: Former Smoker    Quit date: 08/18/1962  . Smokeless tobacco: Never Used  . Alcohol use No  . Drug use: No  . Sexual activity: Not on file   Other Topics Concern  . Not on file   Social History Narrative   Now has living will   Son [name] is her health care POA   Would accept resuscitation attempts--but no prolonged ventilation   No tube feeds if cognitively unaware   Review of Systems Appetite is fine Weight is stable Sleeps okay with the medicine Teeth are okay--- keeps up with dentist (Dr Sharlett Iles) Occasional right shoulder problems---uses aspercreme. Mild other joint issues only Still has left calf lesion---doesn't look worrisome. Will set up exam with Dr Osa Craver are slow-- uses laxative occasionally. Voids okay--- rare incontinence (sounds like urge). Wears pad occasionally    Objective:   Physical Exam  Constitutional: She is oriented to person, place, and time. She appears well-developed. No distress.  HENT:  Mouth/Throat: Oropharynx is clear and moist. No oropharyngeal exudate.  Neck: Normal range of motion. Neck supple. No thyromegaly present.  Cardiovascular: Normal rate, regular rhythm and intact distal pulses.  Exam reveals no gallop.   Soft systolic murmur at base  Pulmonary/Chest: Effort normal and breath sounds normal. No respiratory distress. She has no wheezes. She has no rales.  Abdominal: Soft. There is no tenderness.  Musculoskeletal: She exhibits no edema or tenderness.  Lymphadenopathy:    She has no cervical  adenopathy.  Neurological: She is alert and oriented to person, place, and time.  President-- "Daisy Floro, Obama, ?" [phone] [id number] Recall 3/3  Skin: No rash noted. No erythema.  Psychiatric: She has a normal mood and affect. Her behavior is normal.          Assessment & Plan:

## 2016-06-23 NOTE — Progress Notes (Signed)
Pre visit review using our clinic review tool, if applicable. No additional management support is needed unless otherwise documented below in the visit note. 

## 2016-06-23 NOTE — Assessment & Plan Note (Signed)
Some concern with DOE as anginal equivalent Had reassuring stress test and this is better now

## 2016-06-23 NOTE — Assessment & Plan Note (Signed)
Chronic anxiety and sleep issues Doing well on her regimen

## 2016-06-23 NOTE — Assessment & Plan Note (Signed)
Regular Continues on the eliquis

## 2016-06-23 NOTE — Assessment & Plan Note (Signed)
I have personally reviewed the Medicare Annual Wellness questionnaire and have noted 1. The patient's medical and social history 2. Their use of alcohol, tobacco or illicit drugs 3. Their current medications and supplements 4. The patient's functional ability including ADL's, fall risks, home safety risks and hearing or visual             impairment. 5. Diet and physical activities 6. Evidence for depression or mood disorders  The patients weight, height, BMI and visual acuity have been recorded in the chart I have made referrals, counseling and provided education to the patient based review of the above and I have provided the pt with a written personalized care plan for preventive services.  I have provided you with a copy of your personalized plan for preventive services. Please take the time to review along with your updated medication list.  Prefers no flu vaccine or zostavax No cancer screening due to age

## 2016-06-23 NOTE — Assessment & Plan Note (Signed)
Doing well on maintenance rituxan

## 2016-07-28 DIAGNOSIS — L820 Inflamed seborrheic keratosis: Secondary | ICD-10-CM | POA: Diagnosis not present

## 2016-07-28 DIAGNOSIS — L812 Freckles: Secondary | ICD-10-CM | POA: Diagnosis not present

## 2016-07-28 DIAGNOSIS — C44729 Squamous cell carcinoma of skin of left lower limb, including hip: Secondary | ICD-10-CM | POA: Diagnosis not present

## 2016-07-28 DIAGNOSIS — B009 Herpesviral infection, unspecified: Secondary | ICD-10-CM | POA: Diagnosis not present

## 2016-07-28 DIAGNOSIS — L720 Epidermal cyst: Secondary | ICD-10-CM | POA: Diagnosis not present

## 2016-07-28 DIAGNOSIS — D485 Neoplasm of uncertain behavior of skin: Secondary | ICD-10-CM | POA: Diagnosis not present

## 2016-07-28 DIAGNOSIS — L578 Other skin changes due to chronic exposure to nonionizing radiation: Secondary | ICD-10-CM | POA: Diagnosis not present

## 2016-07-28 DIAGNOSIS — D229 Melanocytic nevi, unspecified: Secondary | ICD-10-CM | POA: Diagnosis not present

## 2016-07-28 DIAGNOSIS — L821 Other seborrheic keratosis: Secondary | ICD-10-CM | POA: Diagnosis not present

## 2016-08-05 DIAGNOSIS — I209 Angina pectoris, unspecified: Secondary | ICD-10-CM | POA: Diagnosis not present

## 2016-08-05 DIAGNOSIS — I495 Sick sinus syndrome: Secondary | ICD-10-CM | POA: Diagnosis not present

## 2016-08-05 DIAGNOSIS — I25810 Atherosclerosis of coronary artery bypass graft(s) without angina pectoris: Secondary | ICD-10-CM | POA: Diagnosis not present

## 2016-08-05 DIAGNOSIS — Z950 Presence of cardiac pacemaker: Secondary | ICD-10-CM | POA: Diagnosis not present

## 2016-08-05 DIAGNOSIS — Z951 Presence of aortocoronary bypass graft: Secondary | ICD-10-CM | POA: Diagnosis not present

## 2016-08-05 DIAGNOSIS — I4891 Unspecified atrial fibrillation: Secondary | ICD-10-CM | POA: Diagnosis not present

## 2016-08-06 ENCOUNTER — Other Ambulatory Visit: Payer: Self-pay | Admitting: Internal Medicine

## 2016-09-02 ENCOUNTER — Inpatient Hospital Stay: Payer: PPO | Attending: Hematology and Oncology

## 2016-09-02 ENCOUNTER — Inpatient Hospital Stay (HOSPITAL_BASED_OUTPATIENT_CLINIC_OR_DEPARTMENT_OTHER): Payer: PPO | Admitting: Hematology and Oncology

## 2016-09-02 ENCOUNTER — Telehealth: Payer: Self-pay | Admitting: Internal Medicine

## 2016-09-02 ENCOUNTER — Inpatient Hospital Stay: Payer: PPO

## 2016-09-02 ENCOUNTER — Encounter: Payer: Self-pay | Admitting: Hematology and Oncology

## 2016-09-02 ENCOUNTER — Other Ambulatory Visit: Payer: Self-pay | Admitting: Hematology and Oncology

## 2016-09-02 VITALS — BP 130/77 | HR 60 | Temp 95.2°F | Resp 18 | Wt 104.3 lb

## 2016-09-02 DIAGNOSIS — Z5112 Encounter for antineoplastic immunotherapy: Secondary | ICD-10-CM

## 2016-09-02 DIAGNOSIS — M810 Age-related osteoporosis without current pathological fracture: Secondary | ICD-10-CM | POA: Insufficient documentation

## 2016-09-02 DIAGNOSIS — I4891 Unspecified atrial fibrillation: Secondary | ICD-10-CM

## 2016-09-02 DIAGNOSIS — I252 Old myocardial infarction: Secondary | ICD-10-CM | POA: Diagnosis not present

## 2016-09-02 DIAGNOSIS — C8580 Other specified types of non-Hodgkin lymphoma, unspecified site: Secondary | ICD-10-CM

## 2016-09-02 DIAGNOSIS — Z87891 Personal history of nicotine dependence: Secondary | ICD-10-CM | POA: Diagnosis not present

## 2016-09-02 DIAGNOSIS — Z950 Presence of cardiac pacemaker: Secondary | ICD-10-CM | POA: Insufficient documentation

## 2016-09-02 DIAGNOSIS — E785 Hyperlipidemia, unspecified: Secondary | ICD-10-CM | POA: Diagnosis not present

## 2016-09-02 DIAGNOSIS — I10 Essential (primary) hypertension: Secondary | ICD-10-CM | POA: Diagnosis not present

## 2016-09-02 DIAGNOSIS — Z79899 Other long term (current) drug therapy: Secondary | ICD-10-CM | POA: Insufficient documentation

## 2016-09-02 DIAGNOSIS — R0602 Shortness of breath: Secondary | ICD-10-CM

## 2016-09-02 DIAGNOSIS — K219 Gastro-esophageal reflux disease without esophagitis: Secondary | ICD-10-CM

## 2016-09-02 DIAGNOSIS — I2510 Atherosclerotic heart disease of native coronary artery without angina pectoris: Secondary | ICD-10-CM | POA: Insufficient documentation

## 2016-09-02 DIAGNOSIS — C884 Extranodal marginal zone B-cell lymphoma of mucosa-associated lymphoid tissue [MALT-lymphoma]: Secondary | ICD-10-CM | POA: Diagnosis not present

## 2016-09-02 DIAGNOSIS — Z85828 Personal history of other malignant neoplasm of skin: Secondary | ICD-10-CM

## 2016-09-02 DIAGNOSIS — E790 Hyperuricemia without signs of inflammatory arthritis and tophaceous disease: Secondary | ICD-10-CM

## 2016-09-02 LAB — CBC WITH DIFFERENTIAL/PLATELET
Basophils Absolute: 0 10*3/uL (ref 0–0.1)
Basophils Relative: 1 %
Eosinophils Absolute: 0.2 10*3/uL (ref 0–0.7)
Eosinophils Relative: 6 %
HCT: 37.5 % (ref 35.0–47.0)
Hemoglobin: 12.8 g/dL (ref 12.0–16.0)
Lymphocytes Relative: 30 %
Lymphs Abs: 1.2 10*3/uL (ref 1.0–3.6)
MCH: 32.7 pg (ref 26.0–34.0)
MCHC: 34.2 g/dL (ref 32.0–36.0)
MCV: 95.8 fL (ref 80.0–100.0)
Monocytes Absolute: 1.4 10*3/uL — ABNORMAL HIGH (ref 0.2–0.9)
Monocytes Relative: 34 %
Neutro Abs: 1.1 10*3/uL — ABNORMAL LOW (ref 1.4–6.5)
Neutrophils Relative %: 29 %
Platelets: 228 10*3/uL (ref 150–440)
RBC: 3.91 MIL/uL (ref 3.80–5.20)
RDW: 14.9 % — ABNORMAL HIGH (ref 11.5–14.5)
WBC: 3.9 10*3/uL (ref 3.6–11.0)

## 2016-09-02 LAB — COMPREHENSIVE METABOLIC PANEL
ALT: 15 U/L (ref 14–54)
AST: 22 U/L (ref 15–41)
Albumin: 4.4 g/dL (ref 3.5–5.0)
Alkaline Phosphatase: 103 U/L (ref 38–126)
Anion gap: 8 (ref 5–15)
BUN: 22 mg/dL — ABNORMAL HIGH (ref 6–20)
CO2: 27 mmol/L (ref 22–32)
Calcium: 9.8 mg/dL (ref 8.9–10.3)
Chloride: 102 mmol/L (ref 101–111)
Creatinine, Ser: 1.01 mg/dL — ABNORMAL HIGH (ref 0.44–1.00)
GFR calc Af Amer: 56 mL/min — ABNORMAL LOW (ref >60)
GFR calc non Af Amer: 48 mL/min — ABNORMAL LOW (ref >60)
Glucose, Bld: 93 mg/dL (ref 65–99)
Potassium: 4.6 mmol/L (ref 3.5–5.1)
Sodium: 137 mmol/L (ref 135–145)
Total Bilirubin: 0.7 mg/dL (ref 0.3–1.2)
Total Protein: 7.3 g/dL (ref 6.5–8.1)

## 2016-09-02 LAB — URIC ACID: Uric Acid, Serum: 3.3 mg/dL (ref 2.3–6.6)

## 2016-09-02 LAB — LACTATE DEHYDROGENASE: LDH: 154 U/L (ref 98–192)

## 2016-09-02 MED ORDER — DIPHENHYDRAMINE HCL 25 MG PO CAPS
25.0000 mg | ORAL_CAPSULE | Freq: Once | ORAL | Status: AC
  Administered 2016-09-02: 25 mg via ORAL
  Filled 2016-09-02: qty 1

## 2016-09-02 MED ORDER — SODIUM CHLORIDE 0.9 % IV SOLN
375.0000 mg/m2 | Freq: Once | INTRAVENOUS | Status: AC
  Administered 2016-09-02: 500 mg via INTRAVENOUS
  Filled 2016-09-02: qty 50

## 2016-09-02 MED ORDER — SODIUM CHLORIDE 0.9 % IV SOLN: 375.0000 mg/m2 | Freq: Once | INTRAVENOUS | Status: DC

## 2016-09-02 MED ORDER — SODIUM CHLORIDE 0.9 % IV SOLN
Freq: Once | INTRAVENOUS | Status: AC
  Administered 2016-09-02: 10:00:00 via INTRAVENOUS
  Filled 2016-09-02: qty 1000

## 2016-09-02 MED ORDER — ACETAMINOPHEN 325 MG PO TABS
650.0000 mg | ORAL_TABLET | Freq: Once | ORAL | Status: AC
  Administered 2016-09-02: 650 mg via ORAL
  Filled 2016-09-02: qty 2

## 2016-09-02 NOTE — Progress Notes (Signed)
[facility]-  Cancer Center  Clinic day:  09/02/16  Chief Complaint: Joan Hull is a 83 y.o. female with stage III marginal zone lymphoma s/p Rituxan who is seen for for assessment prior to cycle #2 maintenance Rituxan.  HPI:  The patient was last seen in the medical oncology clinic on 05/27/2016.  At that time, she denied any B symptoms.  She had chronic shortness of breath.  Exam was unremarkable.  Uric acid was 7.3 after discontinuation of allopurinol.    Allopurinol was restarted.  Uric acid was 3.1 on 06/03/2016.  She received maintenance cycle #1 Rituxan.  During the interim, she has felt "about the same".  She is taking her allopurinol.  She notes interval removal of a skin cancer on her left lower extremity.  She got a "good report" from Dr. Lady Gary.  She has a sore spot near her spine.  She denies any By symptoms.  She denies any adenopathy, bruising or bleeding.   Past Medical History:  Diagnosis Date  . Anxiety   . Arthritis   . Atrial fibrillation ([facility])   . CAD (coronary artery disease)   . Cancer [facility]) 2017   lymphoma  . Depression   . Dysrhythmia   . GERD (gastroesophageal reflux disease)   . Heart murmur   . Hx of colonic polyps   . Hyperlipidemia   . Hypertension   . Interstitial cystitis   . Non Q wave myocardial infarction ([facility]) 06/27/11   [facility]  . Osteoporosis   . Presence of permanent cardiac pacemaker   . Shortness of breath dyspnea    doe    Past Surgical History:  Procedure Laterality Date  . AXILLARY LYMPH NODE BIOPSY Left 02/06/2016   Procedure: AXILLARY LYMPH NODE BIOPSY;  Surgeon: Earline Mayotte, MD;  Location: [facility] ORS;  Service: General;  Laterality: Left;  . CARDIAC CATHETERIZATION    . CATARACT EXTRACTION Right 2005  . COLONOSCOPY  2007   Dr Mechele Collin  . CORONARY ANGIOPLASTY    . CORONARY ARTERY BYPASS GRAFT  1/13   [facility]  . CORONARY STENT PLACEMENT  7/12   RCA with drug eluting stent---[name]  . EYE SURGERY     . INSERT / REPLACE / REMOVE PACEMAKER    . PACEMAKER INSERTION  5/13   [facility]    Family History  Problem Relation Age of Onset  . Heart disease Mother     cad  . Hypertension Mother   . Heart disease Sister     half sister MI  . Cancer Sister     BRAIN  . Heart disease Father     heart attack  . Heart disease Brother     cabg  . Heart disease Brother   . Cancer Brother     BLADDER  . Cancer Sister 53    OVARIAN    Social History:  reports that she quit smoking about 54 years ago. She has never used smokeless tobacco. She reports that she does not drink alcohol or use drugs.  She smoked when she was young (quit in 1964).  She lives alone in [place].  Her son lives in [place], [place]. The patient is alone today.  Allergies:  Allergies  Allergen Reactions  . Aspirin Other (See Comments)    Bladder spasm  . Diazepam     REACTION: worsened bladder problems  . Chlorhexidine Rash    Skin reacts only to scrub    Current Medications: Current Outpatient  Prescriptions  Medication Sig Dispense Refill  . acyclovir (ZOVIRAX) 400 MG tablet Take 400 mg by mouth continuous as needed.    Marland Kitchen allopurinol (ZYLOPRIM) 300 MG tablet Take 1 tablet (300 mg total) by mouth daily. 90 tablet 2  . atenolol (TENORMIN) 25 MG tablet Take 25 mg by mouth daily.    . cholecalciferol (VITAMIN D) 1000 units tablet Take 1,000 Units by mouth daily.    Marland Kitchen diltiazem (CARDIZEM CD) 180 MG 24 hr capsule Take 180 mg by mouth daily.   5  . ELIQUIS 2.5 MG TABS tablet Take 2.5 mg by mouth 2 (two) times daily.     . famotidine (PEPCID) 20 MG tablet Take 20 mg by mouth 2 (two) times daily.    . metoprolol (LOPRESSOR) 50 MG tablet TAKE ONE-HALF TABLET BY MOUTH TWICE DAILY 90 tablet 3  . Multiple Vitamins-Minerals (CENTRUM SILVER 50+WOMEN PO) Take 1 tablet by mouth.    Marland Kitchen PARoxetine (PAXIL) 10 MG tablet TAKE ONE TABLET BY MOUTH ONCE DAILY 90 tablet 3  . Probiotic Product (PROBIOTIC DAILY PO) Take 1 capsule by  mouth daily.     Marland Kitchen torsemide (DEMADEX) 20 MG tablet TAKE ONE-HALF TABLET BY MOUTH ONCE DAILY 45 tablet 0  . vitamin B-12 (CYANOCOBALAMIN) 100 MCG tablet Take 100 mcg by mouth daily.    Marland Kitchen zolpidem (AMBIEN) 5 MG tablet TAKE ONE TABLET BY MOUTH AT BEDTIME AS NEEDED 90 tablet 0   No current facility-administered medications for this visit.     Review of Systems:  GENERAL:  Feels "about the same".  No fevers or sweats.  Weight up 2 pounds. PERFORMANCE STATUS (ECOG):  1 HEENT:  No visual changes, runny nose, sore throat, mouth sores or tenderness. Lungs: Shortness of breath with exertion.  No cough.  No hemoptysis. Cardiac:  Atrial fibrillation.  No chest pain, palpitations, orthopnea, or PND. GI:  Appetite good.  No nausea, vomiting, diarrhea, constipation, melena or hematochezia. GU:  No urgency, frequency, dysuria, or hematuria. Musculoskeletal:  No back pain.  No joint pain.  No muscle tenderness. Extremities:  Arthritis.  No lower extremity pain or swelling. Skin:  Skin cancer removed on lower extremity.  No rashes or skin changes. Neuro:  No headache, numbness or weakness, balance or coordination issues. Endocrine:  No diabetes, thyroid issues, hot flashes or night sweats. Psych:  No mood changes, depression or anxiety. Pain:  No focal pain. Review of systems:  All other systems reviewed and found to be negative.  Physical Exam: Blood pressure 130/77, pulse 60, temperature (!) 95.2 F (35.1 C), temperature source Tympanic, resp. rate 18, weight 104 lb 4.4 oz (47.3 kg). GENERAL:  Well developed, well nourished, woman sitting comfortably in the exam room in no acute distress. MENTAL STATUS:  Alert and oriented to person, place and time. HEAD:  Dark brown wavy hair.  Normocephalic, atraumatic, face symmetric, no Cushingoid features. EYES:  Glasses.  Blue eyes s/p cataract surgery on the right.  Pupils equal round and reactive to light and accomodation.  No conjunctivitis or scleral  icterus. ENT:  Oropharynx clear without lesion.  Tongue normal. Mucous membranes moist.  RESPIRATORY:  Clear to auscultation without rales, wheezes or rhonchi. CARDIOVASCULAR:  Irregular rhythm without murmur, rub or gallop. CHEST:  Well healed midline sternotomy incision.  Pacemaker. ABDOMEN:  Soft, non-tender, with active bowel sounds, and no hepatosplenomegaly.  No masses. SKIN:  Left lower extremity s/p skin cancer removal.  No rashes, ulcers or lesions. EXTREMITIES: Arthritis changes in  hands.  Left ganglion cyst.  No edema, no skin discoloration or tenderness.  No palpable cords. LYMPH NODES: No palpable cervical, supraclavicular, axillary or inguinal adenopathy  NEUROLOGICAL: Unremarkable. PSYCH:  Appropriate.   Appointment on 09/02/2016  Component Date Value Ref Range Status  . WBC 09/02/2016 3.9  3.6 - 11.0 K/uL Final  . RBC 09/02/2016 3.91  3.80 - 5.20 MIL/uL Final  . Hemoglobin 09/02/2016 12.8  12.0 - 16.0 g/dL Final  . HCT 09/02/2016 37.5  35.0 - 47.0 % Final  . MCV 09/02/2016 95.8  80.0 - 100.0 fL Final  . MCH 09/02/2016 32.7  26.0 - 34.0 pg Final  . MCHC 09/02/2016 34.2  32.0 - 36.0 g/dL Final  . RDW 09/02/2016 14.9* 11.5 - 14.5 % Final  . Platelets 09/02/2016 228  150 - 440 K/uL Final  . Neutrophils Relative % 09/02/2016 29  % Final  . Neutro Abs 09/02/2016 1.1* 1.4 - 6.5 K/uL Final  . Lymphocytes Relative 09/02/2016 30  % Final  . Lymphs Abs 09/02/2016 1.2  1.0 - 3.6 K/uL Final  . Monocytes Relative 09/02/2016 34  % Final  . Monocytes Absolute 09/02/2016 1.4* 0.2 - 0.9 K/uL Final  . Eosinophils Relative 09/02/2016 6  % Final  . Eosinophils Absolute 09/02/2016 0.2  0 - 0.7 K/uL Final  . Basophils Relative 09/02/2016 1  % Final  . Basophils Absolute 09/02/2016 0.0  0 - 0.1 K/uL Final  . Sodium 09/02/2016 137  135 - 145 mmol/L Final  . Potassium 09/02/2016 4.6  3.5 - 5.1 mmol/L Final  . Chloride 09/02/2016 102  101 - 111 mmol/L Final  . CO2 09/02/2016 27  22 - 32  mmol/L Final  . Glucose, Bld 09/02/2016 93  65 - 99 mg/dL Final  . BUN 09/02/2016 22* 6 - 20 mg/dL Final  . Creatinine, Ser 09/02/2016 1.01* 0.44 - 1.00 mg/dL Final  . Calcium 09/02/2016 9.8  8.9 - 10.3 mg/dL Final  . Total Protein 09/02/2016 7.3  6.5 - 8.1 g/dL Final  . Albumin 09/02/2016 4.4  3.5 - 5.0 g/dL Final  . AST 09/02/2016 22  15 - 41 U/L Final  . ALT 09/02/2016 15  14 - 54 U/L Final  . Alkaline Phosphatase 09/02/2016 103  38 - 126 U/L Final  . Total Bilirubin 09/02/2016 0.7  0.3 - 1.2 mg/dL Final  . GFR calc non Af Amer 09/02/2016 48* >60 mL/min Final  . GFR calc Af Amer 09/02/2016 56* >60 mL/min Final   Comment: (NOTE) The eGFR has been calculated using the CKD EPI equation. This calculation has not been validated in all clinical situations. eGFR's persistently <60 mL/min signify possible Chronic Kidney Disease.   . Anion gap 09/02/2016 8  5 - 15 Final  . LDH 09/02/2016 154  98 - 192 U/L Final    Assessment:  KINNEDY MONGIELLO is a 83 y.o. female with a stage III marginal zone lymphoma.  She presented with a left axillary mass.  Biopsy on 01/15/2016 revealed an atypical lymphoid proliferation highly suspicious for non-Hodgkin's lymphoma.  There were diffuse sheets of B-cells (CD20 and CD79a +; CD5 and CD10-).  In addition, there was CD138 + abundant plasmacytoid cells with lambda light chain restriction. The lambda staining was apparently present in lymphoid cells.   Lymph node excisional biopsy on 02/06/2016 revealed mature B-cell lymphoma with plasmacytic differentiation, best classified as a marginal zone lymphoma.  PET scan on 01/31/2016 revealed active lymphoma within the chest, abdomen, and pelvis.  The largest lymph node was 1.7 cm.  Hepatitis B core antibody IgM, hepatitis B surface antigen, and hepatitis C antibody were negative on 01/21/2016.  Hepatitis B core antibody was negative on 02/22/2016.  G6PD assay was negative on 02/22/2016.  She developed acute renal  insufficiency after initiation of Septra.  Renal ultrasound on 03/04/2016 revealed no evidence of hydronephrosis.  Baseline creatinine is 0.91 - 1.13 (CrCl 44 - 54 ml/min).  Creatinine was 1.54 (CrCl 29 ml/min) on 03/04/2016 and improved to baseline off Septra.  She received weekly Rituxan x 4 (03/11/2016 - 04/01/2016).  She tolerated her infusions well.  PET scan on 05/01/2016 revealed significant partial metabolic response. There was residual mildly hypermetabolic left axillary and mediastinal lymphoma, decreased in size and metabolism. Additional previously described sites of hypermetabolic lymphoma had resolved.   She is s/p cycle #1 maintenance Rituxan (06/03/2016).  Symptomatically, she denies any B symptoms.  Exam reveals no adenopathy or heaptosplenomegaly.  Uric acid is 3.3 (normal).  Plan: 1.  Labs today:  CBC with diff, CMP, LDH, uric acid. 2.  Cycle #2 maintenance Rituxan. 3.  Continue allopurinol 300 mg a day. 4.  Continue plan for maintenance Rituxan every 3 months x 2 years.  5.  RTC in 12 weeks for MD assessment, labs (CBC with diff, CMP, LDH, uric acid), and cycle #3 maintenance RItuxan.   Lequita Asal, MD  09/02/2016, 9:33 AM

## 2016-09-15 ENCOUNTER — Other Ambulatory Visit: Payer: Self-pay | Admitting: Internal Medicine

## 2016-09-15 NOTE — Telephone Encounter (Signed)
Approved: #90 x 0 

## 2016-09-15 NOTE — Telephone Encounter (Signed)
Left refill on voice mail at pharmacy  

## 2016-09-15 NOTE — Telephone Encounter (Signed)
Find out what this is--- ?the [place]?

## 2016-09-15 NOTE — Telephone Encounter (Signed)
Last filled 06-18-16 #90 Last OV 06-23-16 Next  OV 06-26-17

## 2016-09-15 NOTE — Telephone Encounter (Signed)
Pt called to provide an RX number which is [id number] and she needs a 90 day supply

## 2016-09-15 NOTE — Telephone Encounter (Signed)
I apologize this was given to you. I called in the [place]

## 2016-09-16 ENCOUNTER — Other Ambulatory Visit: Payer: Self-pay | Admitting: Internal Medicine

## 2016-10-14 DIAGNOSIS — E790 Hyperuricemia without signs of inflammatory arthritis and tophaceous disease: Secondary | ICD-10-CM | POA: Insufficient documentation

## 2016-11-25 ENCOUNTER — Inpatient Hospital Stay: Payer: PPO

## 2016-11-25 ENCOUNTER — Inpatient Hospital Stay: Payer: PPO | Admitting: Hematology and Oncology

## 2016-11-25 NOTE — Progress Notes (Deleted)
[facility] day:  11/25/16  Chief Complaint: [place] is a 83 y.o. female with stage III marginal zone lymphoma s/p Rituxan who is seen for for assessment prior to cycle #3 maintenance Rituxan.  HPI:  The patient was last seen in the medical oncology clinic on 09/02/2016.  At that time, she denied any B symptoms.  Exam revealed no adenopathy or hepatosplenomegaly.   She received maintenance cycle #2 Rituxan.  During the interim,   Past Medical History:  Diagnosis Date  . Anxiety   . Arthritis   . Atrial fibrillation ([place])   . CAD (coronary artery disease)   . Cancer [facility]) 2017   lymphoma  . Depression   . Dysrhythmia   . GERD (gastroesophageal reflux disease)   . Heart murmur   . Hx of colonic polyps   . Hyperlipidemia   . Hypertension   . Interstitial cystitis   . Non Q wave myocardial infarction ([place]) 06/27/11   [facility]  . Osteoporosis   . Presence of permanent cardiac pacemaker   . Shortness of breath dyspnea    doe    Past Surgical History:  Procedure Laterality Date  . AXILLARY LYMPH NODE BIOPSY Left 02/06/2016   Procedure: AXILLARY LYMPH NODE BIOPSY;  Surgeon: [name], MD;  Location: [facility] ORS;  Service: General;  Laterality: Left;  . CARDIAC CATHETERIZATION    . CATARACT EXTRACTION Right 2005  . COLONOSCOPY  2007   [name]  . CORONARY ANGIOPLASTY    . CORONARY ARTERY BYPASS GRAFT  1/13   [facility]  . CORONARY STENT PLACEMENT  7/12   RCA with drug eluting stent---[name]  . EYE SURGERY    . INSERT / REPLACE / REMOVE PACEMAKER    . PACEMAKER INSERTION  5/13   [facility]    Family History  Problem Relation Age of Onset  . Heart disease Mother     cad  . Hypertension Mother   . Heart disease Sister     half sister MI  . Cancer Sister     BRAIN  . Heart disease Father     heart attack  . Heart disease Brother     cabg  . Heart disease Brother   . Cancer Brother     BLADDER  . Cancer Sister  54    OVARIAN    Social History:  reports that she quit smoking about 54 years ago. She has never used smokeless tobacco. She reports that she does not drink alcohol or use drugs.  She smoked when she was young (quit in 1964).  She lives alone in [place].  Her son lives in [place], [place]. The patient is alone today.  Allergies:  Allergies  Allergen Reactions  . Aspirin Other (See Comments)    Bladder spasm  . Diazepam     REACTION: worsened bladder problems  . Chlorhexidine Rash    Skin reacts only to scrub    Current Medications: Current Outpatient Prescriptions  Medication Sig Dispense Refill  . acyclovir (ZOVIRAX) 400 MG tablet Take 400 mg by mouth continuous as needed.    Marland Kitchen allopurinol (ZYLOPRIM) 300 MG tablet Take 1 tablet (300 mg total) by mouth daily. 90 tablet 2  . atenolol (TENORMIN) 25 MG tablet Take 25 mg by mouth daily.    . cholecalciferol (VITAMIN D) 1000 units tablet Take 1,000 Units by mouth daily.    Marland Kitchen diltiazem (CARDIZEM CD) 180 MG 24 hr capsule  Take 180 mg by mouth daily.   5  . ELIQUIS 2.5 MG TABS tablet Take 2.5 mg by mouth 2 (two) times daily.     . famotidine (PEPCID) 20 MG tablet Take 20 mg by mouth 2 (two) times daily.    . metoprolol (LOPRESSOR) 50 MG tablet TAKE ONE-HALF TABLET BY MOUTH TWICE DAILY 90 tablet 3  . Multiple Vitamins-Minerals (CENTRUM SILVER 50+WOMEN PO) Take 1 tablet by mouth.    Marland Kitchen PARoxetine (PAXIL) 10 MG tablet TAKE ONE TABLET BY MOUTH ONCE DAILY 90 tablet 3  . Probiotic Product (PROBIOTIC DAILY PO) Take 1 capsule by mouth daily.     Marland Kitchen torsemide (DEMADEX) 20 MG tablet TAKE ONE-HALF TABLET BY MOUTH ONCE DAILY 45 tablet 1  . vitamin B-12 (CYANOCOBALAMIN) 100 MCG tablet Take 100 mcg by mouth daily.    Marland Kitchen zolpidem (AMBIEN) 5 MG tablet TAKE ONE TABLET BY MOUTH AT BEDTIME AS NEEDED 90 tablet 0   No current facility-administered medications for this visit.     Review of Systems:  GENERAL:  Feels "about the same".  No fevers or sweats.   Weight up 2 pounds. PERFORMANCE STATUS (ECOG):  1 HEENT:  No visual changes, runny nose, sore throat, mouth sores or tenderness. Lungs: Shortness of breath with exertion.  No cough.  No hemoptysis. Cardiac:  Atrial fibrillation.  No chest pain, palpitations, orthopnea, or PND. GI:  Appetite good.  No nausea, vomiting, diarrhea, constipation, melena or hematochezia. GU:  No urgency, frequency, dysuria, or hematuria. Musculoskeletal:  No back pain.  No joint pain.  No muscle tenderness. Extremities:  Arthritis.  No lower extremity pain or swelling. Skin:  Skin cancer removed on lower extremity.  No rashes or skin changes. Neuro:  No headache, numbness or weakness, balance or coordination issues. Endocrine:  No diabetes, thyroid issues, hot flashes or night sweats. Psych:  No mood changes, depression or anxiety. Pain:  No focal pain. Review of systems:  All other systems reviewed and found to be negative.  Physical Exam: There were no vitals taken for this visit. GENERAL:  Well developed, well nourished, woman sitting comfortably in the exam room in no acute distress. MENTAL STATUS:  Alert and oriented to person, place and time. HEAD:  Dark brown wavy hair.  Normocephalic, atraumatic, face symmetric, no Cushingoid features. EYES:  Glasses.  Blue eyes s/p cataract surgery on the right.  Pupils equal round and reactive to light and accomodation.  No conjunctivitis or scleral icterus. ENT:  Oropharynx clear without lesion.  Tongue normal. Mucous membranes moist.  RESPIRATORY:  Clear to auscultation without rales, wheezes or rhonchi. CARDIOVASCULAR:  Irregular rhythm without murmur, rub or gallop. CHEST:  Well healed midline sternotomy incision.  Pacemaker. ABDOMEN:  Soft, non-tender, with active bowel sounds, and no hepatosplenomegaly.  No masses. SKIN:  Left lower extremity s/p skin cancer removal.  No rashes, ulcers or lesions. EXTREMITIES: Arthritis changes in hands.  Left ganglion cyst.   No edema, no skin discoloration or tenderness.  No palpable cords. LYMPH NODES: No palpable cervical, supraclavicular, axillary or inguinal adenopathy  NEUROLOGICAL: Unremarkable. PSYCH:  Appropriate.   No visits with results within 3 Day(s) from this visit.  Latest known visit with results is:  Appointment on 09/02/2016  Component Date Value Ref Range Status  . WBC 09/02/2016 3.9  3.6 - 11.0 K/uL Final  . RBC 09/02/2016 3.91  3.80 - 5.20 MIL/uL Final  . Hemoglobin 09/02/2016 12.8  12.0 - 16.0 g/dL Final  . HCT  09/02/2016 37.5  35.0 - 47.0 % Final  . MCV 09/02/2016 95.8  80.0 - 100.0 fL Final  . MCH 09/02/2016 32.7  26.0 - 34.0 pg Final  . MCHC 09/02/2016 34.2  32.0 - 36.0 g/dL Final  . RDW 09/02/2016 14.9* 11.5 - 14.5 % Final  . Platelets 09/02/2016 228  150 - 440 K/uL Final  . Neutrophils Relative % 09/02/2016 29  % Final  . Neutro Abs 09/02/2016 1.1* 1.4 - 6.5 K/uL Final  . Lymphocytes Relative 09/02/2016 30  % Final  . Lymphs Abs 09/02/2016 1.2  1.0 - 3.6 K/uL Final  . Monocytes Relative 09/02/2016 34  % Final  . Monocytes Absolute 09/02/2016 1.4* 0.2 - 0.9 K/uL Final  . Eosinophils Relative 09/02/2016 6  % Final  . Eosinophils Absolute 09/02/2016 0.2  0 - 0.7 K/uL Final  . Basophils Relative 09/02/2016 1  % Final  . Basophils Absolute 09/02/2016 0.0  0 - 0.1 K/uL Final  . Sodium 09/02/2016 137  135 - 145 mmol/L Final  . Potassium 09/02/2016 4.6  3.5 - 5.1 mmol/L Final  . Chloride 09/02/2016 102  101 - 111 mmol/L Final  . CO2 09/02/2016 27  22 - 32 mmol/L Final  . Glucose, Bld 09/02/2016 93  65 - 99 mg/dL Final  . BUN 09/02/2016 22* 6 - 20 mg/dL Final  . Creatinine, Ser 09/02/2016 1.01* 0.44 - 1.00 mg/dL Final  . Calcium 09/02/2016 9.8  8.9 - 10.3 mg/dL Final  . Total Protein 09/02/2016 7.3  6.5 - 8.1 g/dL Final  . Albumin 09/02/2016 4.4  3.5 - 5.0 g/dL Final  . AST 09/02/2016 22  15 - 41 U/L Final  . ALT 09/02/2016 15  14 - 54 U/L Final  . Alkaline Phosphatase 09/02/2016  103  38 - 126 U/L Final  . Total Bilirubin 09/02/2016 0.7  0.3 - 1.2 mg/dL Final  . GFR calc non Af Amer 09/02/2016 48* >60 mL/min Final  . GFR calc Af Amer 09/02/2016 56* >60 mL/min Final   Comment: (NOTE) The eGFR has been calculated using the CKD EPI equation. This calculation has not been validated in all clinical situations. eGFR's persistently <60 mL/min signify possible Chronic Kidney Disease.   . Anion gap 09/02/2016 8  5 - 15 Final  . LDH 09/02/2016 154  98 - 192 U/L Final  . Uric Acid, Serum 09/02/2016 3.3  2.3 - 6.6 mg/dL Final    Assessment:  ONYX EDGLEY is a 83 y.o. female with a stage III marginal zone lymphoma.  She presented with a left axillary mass.  Biopsy on 01/15/2016 revealed an atypical lymphoid proliferation highly suspicious for non-Hodgkin's lymphoma.  There were diffuse sheets of B-cells (CD20 and CD79a +; CD5 and CD10-).  In addition, there was CD138 + abundant plasmacytoid cells with lambda light chain restriction. The lambda staining was apparently present in lymphoid cells.   Lymph node excisional biopsy on 02/06/2016 revealed mature B-cell lymphoma with plasmacytic differentiation, best classified as a marginal zone lymphoma.  PET scan on 01/31/2016 revealed active lymphoma within the chest, abdomen, and pelvis.  The largest lymph node was 1.7 cm.  Hepatitis B core antibody IgM, hepatitis B surface antigen, and hepatitis C antibody were negative on 01/21/2016.  Hepatitis B core antibody was negative on 02/22/2016.  G6PD assay was negative on 02/22/2016.  She developed acute renal insufficiency after initiation of Septra.  Renal ultrasound on 03/04/2016 revealed no evidence of hydronephrosis.  Baseline creatinine is 0.91 -  1.13 (CrCl 44 - 54 ml/min).  Creatinine was 1.54 (CrCl 29 ml/min) on 03/04/2016 and improved to baseline off Septra.  She received weekly Rituxan x 4 (03/11/2016 - 04/01/2016).  She tolerated her infusions well.  PET scan on 05/01/2016  revealed significant partial metabolic response. There was residual mildly hypermetabolic left axillary and mediastinal lymphoma, decreased in size and metabolism. Additional previously described sites of hypermetabolic lymphoma had resolved.   She is s/p 2 cycles maintenance Rituxan (06/03/2016 - 09/02/2016).  Symptomatically, she denies any B symptoms.  Exam reveals no adenopathy or hepatosplenomegaly.  Uric acid is 3.3 (normal).  Plan: 1.  Labs today:  CBC with diff, CMP, LDH, uric acid. 2.  Cycle #3 maintenance Rituxan. 3.  Continue allopurinol 300 mg a day. 4.  Continue plan for maintenance Rituxan every 3 months x 2 years.  5.  Discuss restaging studies (PET scan versus non-contrasted CT) prior to next cycle of Rituxan. 6.  RTC in 12 weeks for MD assessment, labs (CBC with diff, CMP, LDH, uric acid), and cycle #3 maintenance RItuxan.   Lequita Asal, MD  11/25/2016, 3:47 AM

## 2016-12-02 ENCOUNTER — Inpatient Hospital Stay: Payer: PPO | Attending: Hematology and Oncology

## 2016-12-02 ENCOUNTER — Inpatient Hospital Stay: Payer: PPO

## 2016-12-02 ENCOUNTER — Encounter: Payer: Self-pay | Admitting: Hematology and Oncology

## 2016-12-02 ENCOUNTER — Inpatient Hospital Stay (HOSPITAL_BASED_OUTPATIENT_CLINIC_OR_DEPARTMENT_OTHER): Payer: PPO | Admitting: Hematology and Oncology

## 2016-12-02 VITALS — BP 123/71 | HR 61 | Temp 96.1°F | Resp 18 | Wt 102.1 lb

## 2016-12-02 DIAGNOSIS — E790 Hyperuricemia without signs of inflammatory arthritis and tophaceous disease: Secondary | ICD-10-CM

## 2016-12-02 DIAGNOSIS — I2510 Atherosclerotic heart disease of native coronary artery without angina pectoris: Secondary | ICD-10-CM | POA: Diagnosis not present

## 2016-12-02 DIAGNOSIS — I4891 Unspecified atrial fibrillation: Secondary | ICD-10-CM | POA: Insufficient documentation

## 2016-12-02 DIAGNOSIS — C884 Extranodal marginal zone B-cell lymphoma of mucosa-associated lymphoid tissue [MALT-lymphoma]: Secondary | ICD-10-CM | POA: Diagnosis not present

## 2016-12-02 DIAGNOSIS — K219 Gastro-esophageal reflux disease without esophagitis: Secondary | ICD-10-CM | POA: Diagnosis not present

## 2016-12-02 DIAGNOSIS — Z87891 Personal history of nicotine dependence: Secondary | ICD-10-CM

## 2016-12-02 DIAGNOSIS — I252 Old myocardial infarction: Secondary | ICD-10-CM | POA: Insufficient documentation

## 2016-12-02 DIAGNOSIS — Z5112 Encounter for antineoplastic immunotherapy: Secondary | ICD-10-CM | POA: Diagnosis not present

## 2016-12-02 DIAGNOSIS — Z7901 Long term (current) use of anticoagulants: Secondary | ICD-10-CM | POA: Diagnosis not present

## 2016-12-02 DIAGNOSIS — I10 Essential (primary) hypertension: Secondary | ICD-10-CM

## 2016-12-02 DIAGNOSIS — Z79899 Other long term (current) drug therapy: Secondary | ICD-10-CM

## 2016-12-02 DIAGNOSIS — R05 Cough: Secondary | ICD-10-CM | POA: Diagnosis not present

## 2016-12-02 DIAGNOSIS — E785 Hyperlipidemia, unspecified: Secondary | ICD-10-CM

## 2016-12-02 DIAGNOSIS — Z8052 Family history of malignant neoplasm of bladder: Secondary | ICD-10-CM | POA: Diagnosis not present

## 2016-12-02 DIAGNOSIS — C8580 Other specified types of non-Hodgkin lymphoma, unspecified site: Secondary | ICD-10-CM

## 2016-12-02 DIAGNOSIS — Z8041 Family history of malignant neoplasm of ovary: Secondary | ICD-10-CM | POA: Diagnosis not present

## 2016-12-02 DIAGNOSIS — Z950 Presence of cardiac pacemaker: Secondary | ICD-10-CM | POA: Diagnosis not present

## 2016-12-02 DIAGNOSIS — R634 Abnormal weight loss: Secondary | ICD-10-CM | POA: Diagnosis not present

## 2016-12-02 LAB — LACTATE DEHYDROGENASE: LDH: 178 U/L (ref 98–192)

## 2016-12-02 LAB — COMPREHENSIVE METABOLIC PANEL
ALT: 16 U/L (ref 14–54)
AST: 26 U/L (ref 15–41)
Albumin: 3.7 g/dL (ref 3.5–5.0)
Alkaline Phosphatase: 109 U/L (ref 38–126)
Anion gap: 9 (ref 5–15)
BUN: 18 mg/dL (ref 6–20)
CO2: 25 mmol/L (ref 22–32)
Calcium: 9.2 mg/dL (ref 8.9–10.3)
Chloride: 103 mmol/L (ref 101–111)
Creatinine, Ser: 0.88 mg/dL (ref 0.44–1.00)
GFR calc Af Amer: >60 mL/min (ref >60)
GFR calc non Af Amer: 57 mL/min — ABNORMAL LOW (ref >60)
Glucose, Bld: 123 mg/dL — ABNORMAL HIGH (ref 65–99)
Potassium: 3.8 mmol/L (ref 3.5–5.1)
Sodium: 137 mmol/L (ref 135–145)
Total Bilirubin: 0.5 mg/dL (ref 0.3–1.2)
Total Protein: 6.8 g/dL (ref 6.5–8.1)

## 2016-12-02 LAB — CBC WITH DIFFERENTIAL/PLATELET
Basophils Absolute: 0.1 10*3/uL (ref 0–0.1)
Basophils Relative: 1 %
Eosinophils Absolute: 0.1 10*3/uL (ref 0–0.7)
Eosinophils Relative: 2 %
HCT: 36.6 % (ref 35.0–47.0)
Hemoglobin: 12.6 g/dL (ref 12.0–16.0)
Lymphocytes Relative: 14 %
Lymphs Abs: 1 10*3/uL (ref 1.0–3.6)
MCH: 32.6 pg (ref 26.0–34.0)
MCHC: 34.6 g/dL (ref 32.0–36.0)
MCV: 94.3 fL (ref 80.0–100.0)
Monocytes Absolute: 0.9 10*3/uL (ref 0.2–0.9)
Monocytes Relative: 13 %
Neutro Abs: 5.3 10*3/uL (ref 1.4–6.5)
Neutrophils Relative %: 70 %
Platelets: 294 10*3/uL (ref 150–440)
RBC: 3.88 MIL/uL (ref 3.80–5.20)
RDW: 14.3 % (ref 11.5–14.5)
WBC: 7.5 10*3/uL (ref 3.6–11.0)

## 2016-12-02 LAB — URIC ACID: Uric Acid, Serum: 3.2 mg/dL (ref 2.3–6.6)

## 2016-12-02 MED ORDER — SODIUM CHLORIDE 0.9 % IV SOLN: 375.0000 mg/m2 | Freq: Once | INTRAVENOUS | Status: DC

## 2016-12-02 MED ORDER — DIPHENHYDRAMINE HCL 25 MG PO CAPS
25.0000 mg | ORAL_CAPSULE | Freq: Once | ORAL | Status: AC
  Administered 2016-12-02: 25 mg via ORAL
  Filled 2016-12-02: qty 1

## 2016-12-02 MED ORDER — SODIUM CHLORIDE 0.9 % IV SOLN
Freq: Once | INTRAVENOUS | Status: AC
  Administered 2016-12-02: 10:00:00 via INTRAVENOUS
  Filled 2016-12-02: qty 1000

## 2016-12-02 MED ORDER — RITUXIMAB CHEMO INJECTION 500 MG/50ML
375.0000 mg/m2 | Freq: Once | INTRAVENOUS | Status: AC
  Administered 2016-12-02: 500 mg via INTRAVENOUS
  Filled 2016-12-02: qty 50

## 2016-12-02 MED ORDER — ACETAMINOPHEN 325 MG PO TABS
650.0000 mg | ORAL_TABLET | Freq: Once | ORAL | Status: AC
  Administered 2016-12-02: 650 mg via ORAL
  Filled 2016-12-02: qty 2

## 2016-12-02 NOTE — Progress Notes (Signed)
Patient has had a cold/cough recently.  Otherwise no complaints/

## 2016-12-02 NOTE — Progress Notes (Signed)
[place] day:  12/02/16  Chief Complaint: [place] is a 83 y.o. female with stage III marginal zone lymphoma s/p Rituxan who is seen for for assessment prior to cycle #3 maintenance Rituxan.  HPI:  The patient was last seen in the medical oncology [place] on 09/02/2016.  At that time, she denied any B symptoms.  Exam revealed no adenopathy or hepatosplenomegaly.   She received maintenance cycle #2 Rituxan.  During the interim, she has done well.  She notes a lingering cough for 3 weeks.. She has had a little weight loss. She has any fevers or sweats. She states that eating certain things causes her "stomach to swell up".  She does denies any adenopathy, bruising or bleeding.   Past Medical History:  Diagnosis Date  . Anxiety   . Arthritis   . Atrial fibrillation ([place])   . CAD (coronary artery disease)   . Cancer [facility]) 2017   lymphoma  . Depression   . Dysrhythmia   . GERD (gastroesophageal reflux disease)   . Heart murmur   . Hx of colonic polyps   . Hyperlipidemia   . Hypertension   . Interstitial cystitis   . Non Q wave myocardial infarction ([place]) 06/27/11   [facility]  . Osteoporosis   . Presence of permanent cardiac pacemaker   . Shortness of breath dyspnea    doe    Past Surgical History:  Procedure Laterality Date  . AXILLARY LYMPH NODE BIOPSY Left 02/06/2016   Procedure: AXILLARY LYMPH NODE BIOPSY;  Surgeon: [name], MD;  Location: [facility] ORS;  Service: General;  Laterality: Left;  . CARDIAC CATHETERIZATION    . CATARACT EXTRACTION Right 2005  . COLONOSCOPY  2007   Dr [place]  . CORONARY ANGIOPLASTY    . CORONARY ARTERY BYPASS GRAFT  1/13   [place]  . CORONARY STENT PLACEMENT  7/12   RCA with drug eluting stent---[name]  . EYE SURGERY    . INSERT / REPLACE / REMOVE PACEMAKER    . PACEMAKER INSERTION  5/13   [place]    Family History  Problem Relation Age of Onset  . Heart disease Mother     cad   . Hypertension Mother   . Heart disease Sister     half sister MI  . Cancer Sister     BRAIN  . Heart disease Father     heart attack  . Heart disease Brother     cabg  . Heart disease Brother   . Cancer Brother     BLADDER  . Cancer Sister 66    OVARIAN    Social History:  reports that she quit smoking about 54 years ago. She has never used smokeless tobacco. She reports that she does not drink alcohol or use drugs.  She smoked when she was young (quit in 1964).  She lives alone in [place].  Her son lives in [place], [place]. The patient is alone today.  Allergies:  Allergies  Allergen Reactions  . Aspirin Other (See Comments)    Bladder spasm  . Diazepam     REACTION: worsened bladder problems  . Chlorhexidine Rash    Skin reacts only to scrub    Current Medications: Current Outpatient Prescriptions  Medication Sig Dispense Refill  . acyclovir (ZOVIRAX) 400 MG tablet Take 400 mg by mouth continuous as needed.    Marland Kitchen atenolol (TENORMIN) 25 MG tablet Take 25 mg by mouth daily.    Marland Kitchen  cholecalciferol (VITAMIN D) 1000 units tablet Take 1,000 Units by mouth daily.    Marland Kitchen diltiazem (CARDIZEM CD) 180 MG 24 hr capsule Take 180 mg by mouth daily.   5  . ELIQUIS 2.5 MG TABS tablet Take 2.5 mg by mouth 2 (two) times daily.     . famotidine (PEPCID) 20 MG tablet Take 20 mg by mouth 2 (two) times daily.    . metoprolol (LOPRESSOR) 50 MG tablet TAKE ONE-HALF TABLET BY MOUTH TWICE DAILY 90 tablet 3  . Multiple Vitamins-Minerals (CENTRUM SILVER 50+WOMEN PO) Take 1 tablet by mouth.    Marland Kitchen PARoxetine (PAXIL) 10 MG tablet TAKE ONE TABLET BY MOUTH ONCE DAILY 90 tablet 3  . Probiotic Product (PROBIOTIC DAILY PO) Take 1 capsule by mouth daily.     Marland Kitchen torsemide (DEMADEX) 20 MG tablet TAKE ONE-HALF TABLET BY MOUTH ONCE DAILY 45 tablet 1  . vitamin B-12 (CYANOCOBALAMIN) 100 MCG tablet Take 100 mcg by mouth daily.    Marland Kitchen zolpidem (AMBIEN) 5 MG tablet TAKE ONE TABLET BY MOUTH AT BEDTIME AS NEEDED 90  tablet 0  . allopurinol (ZYLOPRIM) 300 MG tablet Take 1 tablet (300 mg total) by mouth daily. 90 tablet 2   No current facility-administered medications for this visit.     Review of Systems:  GENERAL:  Feels good.  No fevers or sweats.  Weight down 2 pounds. PERFORMANCE STATUS (ECOG):  1 HEENT:  No visual changes, runny nose, sore throat, mouth sores or tenderness. Lungs: No shortness of breath with exertion.  Lingering cough.  No hemoptysis. Cardiac:  Atrial fibrillation.  No chest pain, palpitations, orthopnea, or PND. GI:  Appetite good.  No nausea, vomiting, diarrhea, constipation, melena or hematochezia. GU:  No urgency, frequency, dysuria, or hematuria. Musculoskeletal:  No back pain.  No joint pain.  No muscle tenderness. Extremities:  Arthritis.  No lower extremity pain or swelling. Skin: No rashes or skin changes. Neuro:  No headache, numbness or weakness, balance or coordination issues. Endocrine:  No diabetes, thyroid issues, hot flashes or night sweats. Psych:  No mood changes, depression or anxiety. Pain:  No focal pain. Review of systems:  All other systems reviewed and found to be negative.  Physical Exam: Blood pressure 123/71, pulse 61, temperature (!) 96.1 F (35.6 C), temperature source Tympanic, resp. rate (!) 1, weight 102 lb 2 oz (46.3 kg). GENERAL:  Well developed, well nourished, woman sitting comfortably in the exam room in no acute distress. MENTAL STATUS:  Alert and oriented to person, place and time. HEAD:  Dark brown wavy hair.  Normocephalic, atraumatic, face symmetric, no Cushingoid features. EYES:  Glasses.  Blue eyes s/p cataract surgery on the right.  Pupils equal round and reactive to light and accomodation.  No conjunctivitis or scleral icterus. ENT:  Oropharynx clear without lesion.  Tongue normal. Mucous membranes moist.  RESPIRATORY:  Clear to auscultation without rales, wheezes or rhonchi. CARDIOVASCULAR:  Irregular rhythm without murmur, rub  or gallop. CHEST:  Well healed midline sternotomy incision.  Pacemaker. ABDOMEN:  Soft, non-tender, with active bowel sounds, and no hepatosplenomegaly.  No masses. SKIN:  No rashes, ulcers or lesions. EXTREMITIES: Arthritis changes in hands.  Left ganglion cyst.  No edema, no skin discoloration or tenderness.  No palpable cords. LYMPH NODES: No palpable cervical, supraclavicular, axillary or inguinal adenopathy  NEUROLOGICAL: Unremarkable. PSYCH:  Appropriate.   Appointment on 12/02/2016  Component Date Value Ref Range Status  . WBC 12/02/2016 7.5  3.6 - 11.0 K/uL Final  .  RBC 12/02/2016 3.88  3.80 - 5.20 MIL/uL Final  . Hemoglobin 12/02/2016 12.6  12.0 - 16.0 g/dL Final  . HCT 12/02/2016 36.6  35.0 - 47.0 % Final  . MCV 12/02/2016 94.3  80.0 - 100.0 fL Final  . MCH 12/02/2016 32.6  26.0 - 34.0 pg Final  . MCHC 12/02/2016 34.6  32.0 - 36.0 g/dL Final  . RDW 12/02/2016 14.3  11.5 - 14.5 % Final  . Platelets 12/02/2016 294  150 - 440 K/uL Final  . Neutrophils Relative % 12/02/2016 70  % Final  . Neutro Abs 12/02/2016 5.3  1.4 - 6.5 K/uL Final  . Lymphocytes Relative 12/02/2016 14  % Final  . Lymphs Abs 12/02/2016 1.0  1.0 - 3.6 K/uL Final  . Monocytes Relative 12/02/2016 13  % Final  . Monocytes Absolute 12/02/2016 0.9  0.2 - 0.9 K/uL Final  . Eosinophils Relative 12/02/2016 2  % Final  . Eosinophils Absolute 12/02/2016 0.1  0 - 0.7 K/uL Final  . Basophils Relative 12/02/2016 1  % Final  . Basophils Absolute 12/02/2016 0.1  0 - 0.1 K/uL Final  . Sodium 12/02/2016 137  135 - 145 mmol/L Final  . Potassium 12/02/2016 3.8  3.5 - 5.1 mmol/L Final  . Chloride 12/02/2016 103  101 - 111 mmol/L Final  . CO2 12/02/2016 25  22 - 32 mmol/L Final  . Glucose, Bld 12/02/2016 123* 65 - 99 mg/dL Final  . BUN 12/02/2016 18  6 - 20 mg/dL Final  . Creatinine, Ser 12/02/2016 0.88  0.44 - 1.00 mg/dL Final  . Calcium 12/02/2016 9.2  8.9 - 10.3 mg/dL Final  . Total Protein 12/02/2016 6.8  6.5 - 8.1  g/dL Final  . Albumin 12/02/2016 3.7  3.5 - 5.0 g/dL Final  . AST 12/02/2016 26  15 - 41 U/L Final  . ALT 12/02/2016 16  14 - 54 U/L Final  . Alkaline Phosphatase 12/02/2016 109  38 - 126 U/L Final  . Total Bilirubin 12/02/2016 0.5  0.3 - 1.2 mg/dL Final  . GFR calc non Af Amer 12/02/2016 57* >60 mL/min Final  . GFR calc Af Amer 12/02/2016 >60  >60 mL/min Final   Comment: (NOTE) The eGFR has been calculated using the CKD EPI equation. This calculation has not been validated in all clinical situations. eGFR's persistently <60 mL/min signify possible Chronic Kidney Disease.   . Anion gap 12/02/2016 9  5 - 15 Final  . LDH 12/02/2016 178  98 - 192 U/L Final    Assessment:  [place] is a 83 y.o. female with a stage III marginal zone lymphoma.  She presented with a left axillary mass.  Biopsy on 01/15/2016 revealed an atypical lymphoid proliferation highly suspicious for non-Hodgkin's lymphoma.  There were diffuse sheets of B-cells (CD20 and CD79a +; CD5 and CD10-).  In addition, there was CD138 + abundant plasmacytoid cells with lambda light chain restriction. The lambda staining was apparently present in lymphoid cells.   Lymph node excisional biopsy on 02/06/2016 revealed mature B-cell lymphoma with plasmacytic differentiation, best classified as a marginal zone lymphoma.  PET scan on 01/31/2016 revealed active lymphoma within the chest, abdomen, and pelvis.  The largest lymph node was 1.7 cm.  Hepatitis B core antibody IgM, hepatitis B surface antigen, and hepatitis C antibody were negative on 01/21/2016.  Hepatitis B core antibody was negative on 02/22/2016.  G6PD assay was negative on 02/22/2016.  She developed acute renal insufficiency after initiation of Septra.  Renal ultrasound on 03/04/2016 revealed no evidence of hydronephrosis.  Baseline creatinine is 0.91 - 1.13 (CrCl 44 - 54 ml/min).  Creatinine was 1.54 (CrCl 29 ml/min) on 03/04/2016 and improved to baseline off  Septra.  She received weekly Rituxan x 4 (03/11/2016 - 04/01/2016).  She tolerated her infusions well.  PET scan on 05/01/2016 revealed significant partial metabolic response. There was residual mildly hypermetabolic left axillary and mediastinal lymphoma, decreased in size and metabolism. Additional previously described sites of hypermetabolic lymphoma had resolved.   She is s/p 2 cycles maintenance Rituxan (06/03/2016 - 09/02/2016).  Symptomatically, she denies any B symptoms.  She has a lingering cough after a URI.  Exam reveals no adenopathy or hepatosplenomegaly.  Uric acid is 3.2 (normal).  Plan: 1.  Labs today:  CBC with diff, CMP, LDH, uric acid. 2.  Discuss restaging studies (PET scan versus non-contrasted CT) prior to next cycle of Rituxan. 3.  Continue plan for maintenance Rituxan every 3 months x 2 years. 4.  Cycle #3 maintenance Rituxan today. 5.  Continue allopurinol 300 mg a day. 6.  Schedule PET scan prior to next appointment. 7.  RTC in 12 weeks for MD assessment, labs (CBC with diff, CMP, LDH, uric acid), review of PET scan, and cycle #3 maintenance RItuxan.   Lequita Asal, MD  12/02/2016, 9:37 AM

## 2016-12-15 ENCOUNTER — Other Ambulatory Visit: Payer: Self-pay | Admitting: Internal Medicine

## 2016-12-15 ENCOUNTER — Telehealth: Payer: Self-pay

## 2016-12-15 NOTE — Telephone Encounter (Signed)
Pt called to find out when zolpidem was going to be called in; pt wants to pick up today.Medication phoned to [facility] at [facility] as instructed. Pt notified done and voiced understanding.

## 2016-12-15 NOTE — Telephone Encounter (Signed)
Medication phoned to pharmacy.  

## 2016-12-15 NOTE — Telephone Encounter (Signed)
Last filled 09-16-16 #90 Last OV 06-26-16 Next OV 06-23-17  Forward to Dr Danise Mina in Dr Alla German absence

## 2016-12-15 NOTE — Telephone Encounter (Signed)
plz phone in. 

## 2016-12-15 NOTE — Telephone Encounter (Signed)
Pt request refill of zolpidem sent to [facility] [address]. Pt wants to pick up rx 12/15/16.

## 2017-01-22 DIAGNOSIS — R000 Tachycardia, unspecified: Secondary | ICD-10-CM | POA: Diagnosis not present

## 2017-01-22 DIAGNOSIS — I10 Essential (primary) hypertension: Secondary | ICD-10-CM | POA: Diagnosis not present

## 2017-01-22 DIAGNOSIS — I4891 Unspecified atrial fibrillation: Secondary | ICD-10-CM | POA: Diagnosis not present

## 2017-01-22 DIAGNOSIS — I495 Sick sinus syndrome: Secondary | ICD-10-CM | POA: Diagnosis not present

## 2017-01-22 DIAGNOSIS — R55 Syncope and collapse: Secondary | ICD-10-CM | POA: Diagnosis not present

## 2017-01-22 DIAGNOSIS — I25810 Atherosclerosis of coronary artery bypass graft(s) without angina pectoris: Secondary | ICD-10-CM | POA: Diagnosis not present

## 2017-02-20 ENCOUNTER — Encounter
Admission: RE | Admit: 2017-02-20 | Discharge: 2017-02-20 | Disposition: A | Discharge status: Home / Self Care | Payer: PPO | Source: Ambulatory Visit | Attending: Hematology and Oncology | Admitting: Hematology and Oncology

## 2017-02-20 DIAGNOSIS — C8580 Other specified types of non-Hodgkin lymphoma, unspecified site: Secondary | ICD-10-CM | POA: Insufficient documentation

## 2017-02-20 DIAGNOSIS — E790 Hyperuricemia without signs of inflammatory arthritis and tophaceous disease: Secondary | ICD-10-CM | POA: Insufficient documentation

## 2017-02-20 DIAGNOSIS — Z5112 Encounter for antineoplastic immunotherapy: Secondary | ICD-10-CM | POA: Diagnosis not present

## 2017-02-20 DIAGNOSIS — Z9289 Personal history of other medical treatment: Secondary | ICD-10-CM

## 2017-02-20 DIAGNOSIS — R911 Solitary pulmonary nodule: Secondary | ICD-10-CM | POA: Diagnosis not present

## 2017-02-20 HISTORY — DX: Personal history of other medical treatment: Z92.89

## 2017-02-20 LAB — GLUCOSE, CAPILLARY: Glucose-Capillary: 96 mg/dL (ref 65–99)

## 2017-02-20 MED ORDER — FLUDEOXYGLUCOSE F - 18 (FDG) INJECTION
12.0000 | Freq: Once | INTRAVENOUS | Status: AC | PRN
  Administered 2017-02-20: 12.195 via INTRAVENOUS

## 2017-02-24 ENCOUNTER — Other Ambulatory Visit: Payer: Self-pay | Admitting: Hematology and Oncology

## 2017-02-24 ENCOUNTER — Inpatient Hospital Stay (HOSPITAL_BASED_OUTPATIENT_CLINIC_OR_DEPARTMENT_OTHER): Payer: PPO | Admitting: Hematology and Oncology

## 2017-02-24 ENCOUNTER — Inpatient Hospital Stay: Payer: PPO | Attending: Hematology and Oncology

## 2017-02-24 ENCOUNTER — Encounter: Payer: Self-pay | Admitting: Hematology and Oncology

## 2017-02-24 ENCOUNTER — Inpatient Hospital Stay: Payer: PPO

## 2017-02-24 DIAGNOSIS — I252 Old myocardial infarction: Secondary | ICD-10-CM

## 2017-02-24 DIAGNOSIS — Z808 Family history of malignant neoplasm of other organs or systems: Secondary | ICD-10-CM

## 2017-02-24 DIAGNOSIS — C8580 Other specified types of non-Hodgkin lymphoma, unspecified site: Secondary | ICD-10-CM

## 2017-02-24 DIAGNOSIS — I4891 Unspecified atrial fibrillation: Secondary | ICD-10-CM

## 2017-02-24 DIAGNOSIS — Z950 Presence of cardiac pacemaker: Secondary | ICD-10-CM

## 2017-02-24 DIAGNOSIS — Z79899 Other long term (current) drug therapy: Secondary | ICD-10-CM

## 2017-02-24 DIAGNOSIS — M1990 Unspecified osteoarthritis, unspecified site: Secondary | ICD-10-CM | POA: Diagnosis not present

## 2017-02-24 DIAGNOSIS — Z8052 Family history of malignant neoplasm of bladder: Secondary | ICD-10-CM | POA: Insufficient documentation

## 2017-02-24 DIAGNOSIS — K219 Gastro-esophageal reflux disease without esophagitis: Secondary | ICD-10-CM | POA: Insufficient documentation

## 2017-02-24 DIAGNOSIS — I700 Atherosclerosis of aorta: Secondary | ICD-10-CM | POA: Diagnosis not present

## 2017-02-24 DIAGNOSIS — Z87891 Personal history of nicotine dependence: Secondary | ICD-10-CM | POA: Insufficient documentation

## 2017-02-24 DIAGNOSIS — C884 Extranodal marginal zone B-cell lymphoma of mucosa-associated lymphoid tissue [MALT-lymphoma]: Secondary | ICD-10-CM

## 2017-02-24 DIAGNOSIS — M810 Age-related osteoporosis without current pathological fracture: Secondary | ICD-10-CM | POA: Insufficient documentation

## 2017-02-24 DIAGNOSIS — Z7901 Long term (current) use of anticoagulants: Secondary | ICD-10-CM | POA: Diagnosis not present

## 2017-02-24 DIAGNOSIS — I2510 Atherosclerotic heart disease of native coronary artery without angina pectoris: Secondary | ICD-10-CM | POA: Insufficient documentation

## 2017-02-24 DIAGNOSIS — Z5112 Encounter for antineoplastic immunotherapy: Secondary | ICD-10-CM | POA: Insufficient documentation

## 2017-02-24 DIAGNOSIS — E790 Hyperuricemia without signs of inflammatory arthritis and tophaceous disease: Secondary | ICD-10-CM

## 2017-02-24 LAB — CBC WITH DIFFERENTIAL/PLATELET
Basophils Absolute: 0.1 10*3/uL (ref 0–0.1)
Basophils Relative: 1 %
Eosinophils Absolute: 0.1 10*3/uL (ref 0–0.7)
Eosinophils Relative: 2 %
HCT: 40.3 % (ref 35.0–47.0)
Hemoglobin: 13.8 g/dL (ref 12.0–16.0)
Lymphocytes Relative: 17 %
Lymphs Abs: 1.1 10*3/uL (ref 1.0–3.6)
MCH: 33.3 pg (ref 26.0–34.0)
MCHC: 34.4 g/dL (ref 32.0–36.0)
MCV: 96.9 fL (ref 80.0–100.0)
Monocytes Absolute: 0.9 10*3/uL (ref 0.2–0.9)
Monocytes Relative: 13 %
Neutro Abs: 4.3 10*3/uL (ref 1.4–6.5)
Neutrophils Relative %: 67 %
Platelets: 218 10*3/uL (ref 150–440)
RBC: 4.15 MIL/uL (ref 3.80–5.20)
RDW: 15 % — ABNORMAL HIGH (ref 11.5–14.5)
WBC: 6.5 10*3/uL (ref 3.6–11.0)

## 2017-02-24 LAB — COMPREHENSIVE METABOLIC PANEL
ALT: 37 U/L (ref 14–54)
AST: 33 U/L (ref 15–41)
Albumin: 4.2 g/dL (ref 3.5–5.0)
Alkaline Phosphatase: 103 U/L (ref 38–126)
Anion gap: 10 (ref 5–15)
BUN: 22 mg/dL — ABNORMAL HIGH (ref 6–20)
CO2: 24 mmol/L (ref 22–32)
Calcium: 9.2 mg/dL (ref 8.9–10.3)
Chloride: 106 mmol/L (ref 101–111)
Creatinine, Ser: 1.12 mg/dL — ABNORMAL HIGH (ref 0.44–1.00)
GFR calc Af Amer: 49 mL/min — ABNORMAL LOW (ref >60)
GFR calc non Af Amer: 43 mL/min — ABNORMAL LOW (ref >60)
Glucose, Bld: 113 mg/dL — ABNORMAL HIGH (ref 65–99)
Potassium: 4.2 mmol/L (ref 3.5–5.1)
Sodium: 140 mmol/L (ref 135–145)
Total Bilirubin: 0.5 mg/dL (ref 0.3–1.2)
Total Protein: 6.7 g/dL (ref 6.5–8.1)

## 2017-02-24 LAB — URIC ACID: Uric Acid, Serum: 3.5 mg/dL (ref 2.3–6.6)

## 2017-02-24 LAB — LACTATE DEHYDROGENASE: LDH: 161 U/L (ref 98–192)

## 2017-02-24 LAB — TSH: TSH: 4.471 u[IU]/mL (ref 0.350–4.500)

## 2017-02-24 MED ORDER — DIPHENHYDRAMINE HCL 25 MG PO CAPS
25.0000 mg | ORAL_CAPSULE | Freq: Once | ORAL | Status: AC
Start: 2017-02-24 — End: 2017-02-24
  Administered 2017-02-24: 25 mg via ORAL

## 2017-02-24 MED ORDER — ACETAMINOPHEN 325 MG PO TABS
650.0000 mg | ORAL_TABLET | Freq: Once | ORAL | Status: AC
  Administered 2017-02-24: 650 mg via ORAL

## 2017-02-24 MED ORDER — SODIUM CHLORIDE 0.9 % IV SOLN
500.0000 mg | Freq: Once | INTRAVENOUS | Status: AC
  Administered 2017-02-24: 500 mg via INTRAVENOUS
  Filled 2017-02-24: qty 50

## 2017-02-24 MED ORDER — SODIUM CHLORIDE 0.9 % IV SOLN
Freq: Once | INTRAVENOUS | Status: AC
  Administered 2017-02-24: 12:00:00 via INTRAVENOUS
  Filled 2017-02-24: qty 1000

## 2017-02-24 MED ORDER — SODIUM CHLORIDE 0.9 % IV SOLN: 375.0000 mg/m2 | Freq: Once | INTRAVENOUS | Status: DC

## 2017-02-24 NOTE — Progress Notes (Signed)
Patient offers no complaints today. 

## 2017-02-24 NOTE — Progress Notes (Signed)
[facility] day:  02/24/17  Chief Complaint: Joan Hull is a 83 y.o. female with stage III marginal zone lymphoma s/p Rituxan who is seen for for assessment prior to cycle #4 maintenance Rituxan.  HPI:  The patient was last seen in the medical oncology clinic on 12/02/2016.  At that time, she denied any B symptoms.  Exam revealed no adenopathy or hepatosplenomegaly.   She received maintenance cycle #3 Rituxan.  PET scan on 02/20/2017 revealed no evidence for residual hypermetabolic disease in the neck, chest, abdomen, or pelvis. Left axillary and mediastinal disease seen on the previous study showed no hypermetabolism and some of the lymph nodes have resolved completely in the interval.  Right upper lobe pulmonary nodule resolved completely and the right middle lobe nodule was smaller, measuring only 5 mm.  There was no hypermetabolism in this residual right middle lobe nodule.  Previously identified FDG uptake in the stomach was decreased and appears physiologic.  There was coronary artery and thoracoabdominal aortic atherosclerosis.  During the interim, patient reports that she has been short of breath. Patient reports that her A.fib has been uncontrolled; heart rate in the 110s to 130s. She is under the care of cardiology; scheduled to see them tomorrow (02/25/2017). She notes bruising, tenderness, and itching to her bilateral shins.   Past Medical History:  Diagnosis Date  . Anxiety   . Arthritis   . Atrial fibrillation ([place])   . CAD (coronary artery disease)   . Cancer [facility]) 2017   lymphoma  . Depression   . Dysrhythmia   . GERD (gastroesophageal reflux disease)   . Heart murmur   . History of immunotherapy 02/20/2017  . Hx of colonic polyps   . Hyperlipidemia   . Hypertension   . Interstitial cystitis   . Non Q wave myocardial infarction ([place]) 06/27/11   [facility]  . Osteoporosis   . Presence of permanent cardiac pacemaker   .  Shortness of breath dyspnea    doe    Past Surgical History:  Procedure Laterality Date  . AXILLARY LYMPH NODE BIOPSY Left 02/06/2016   Procedure: AXILLARY LYMPH NODE BIOPSY;  Surgeon: [name], MD;  Location: [facility] ORS;  Service: General;  Laterality: Left;  . CARDIAC CATHETERIZATION    . CATARACT EXTRACTION Right 2005  . COLONOSCOPY  2007   [name]  . CORONARY ANGIOPLASTY    . CORONARY ARTERY BYPASS GRAFT  1/13   [facility]  . CORONARY STENT PLACEMENT  7/12   RCA with drug eluting stent---[name]  . EYE SURGERY    . INSERT / REPLACE / REMOVE PACEMAKER    . PACEMAKER INSERTION  5/13   [facility]    Family History  Problem Relation Age of Onset  . Heart disease Mother        cad  . Hypertension Mother   . Heart disease Sister        half sister MI  . Cancer Sister        BRAIN  . Heart disease Father        heart attack  . Heart disease Brother        cabg  . Heart disease Brother   . Cancer Brother        BLADDER  . Cancer Sister 55       OVARIAN    Social History:  reports that she quit smoking about 54 years ago. She has never used  smokeless tobacco. She reports that she does not drink alcohol or use drugs.  She smoked when she was young (quit in 1964).  She lives alone in [place].  Her son lives in [place], [place]. The patient is alone today.  Allergies:  Allergies  Allergen Reactions  . Aspirin Other (See Comments)    Bladder spasm  . Diazepam     REACTION: worsened bladder problems  . Hydrochlorothiazide Other (See Comments)  . Chlorhexidine Rash    Skin reacts only to scrub    Current Medications: Current Outpatient Prescriptions  Medication Sig Dispense Refill  . allopurinol (ZYLOPRIM) 300 MG tablet Take 1 tablet (300 mg total) by mouth daily. 90 tablet 2  . atenolol (TENORMIN) 25 MG tablet Take 25 mg by mouth daily.    . cholecalciferol (VITAMIN D) 1000 units tablet Take 1,000 Units by mouth daily.    Marland Kitchen diltiazem (CARDIZEM CD) 180 MG  24 hr capsule Take 180 mg by mouth daily.   5  . ELIQUIS 2.5 MG TABS tablet Take 2.5 mg by mouth 2 (two) times daily.     . famotidine (PEPCID) 20 MG tablet Take 20 mg by mouth 2 (two) times daily.    . metoprolol (LOPRESSOR) 50 MG tablet TAKE ONE-HALF TABLET BY MOUTH TWICE DAILY 90 tablet 3  . Multiple Vitamins-Minerals (CENTRUM SILVER 50+WOMEN PO) Take 1 tablet by mouth.    Marland Kitchen PARoxetine (PAXIL) 10 MG tablet TAKE ONE TABLET BY MOUTH ONCE DAILY 90 tablet 3  . Probiotic Product (PROBIOTIC DAILY PO) Take 1 capsule by mouth daily.     Marland Kitchen torsemide (DEMADEX) 20 MG tablet TAKE ONE-HALF TABLET BY MOUTH ONCE DAILY 45 tablet 1  . vitamin B-12 (CYANOCOBALAMIN) 100 MCG tablet Take 100 mcg by mouth daily.    Marland Kitchen zolpidem (AMBIEN) 5 MG tablet TAKE ONE TABLET BY MOUTH AT BEDTIME AS NEEDED 90 tablet 0  . acyclovir (ZOVIRAX) 400 MG tablet Take 400 mg by mouth continuous as needed.     No current facility-administered medications for this visit.     Review of Systems:  GENERAL:  Feels good.  No fevers or sweats.  Weight stable. PERFORMANCE STATUS (ECOG):  1 HEENT:  No visual changes, runny nose, sore throat, mouth sores or tenderness. Lungs: (+)  Increasing shortness of breath with exertion.  Lingering cough.  No hemoptysis. Cardiac:  Atrial fibrillation.  No chest pain, palpitations, orthopnea, or PND. GI:  Appetite good.  No nausea, vomiting, diarrhea, constipation, melena or hematochezia. GU:  No urgency, frequency, dysuria, or hematuria. Musculoskeletal:  No back pain.  No joint pain.  No muscle tenderness. Extremities:  Arthritis.  No lower extremity pain or swelling. Skin: Bruising, itching, and tender shins.  No rashes or skin changes. Neuro:  No headache, numbness or weakness, balance or coordination issues. Endocrine:  No diabetes, thyroid issues, hot flashes or night sweats. Psych:  No mood changes, depression or anxiety. Pain:  No focal pain. Review of systems:  All other systems reviewed and  found to be negative.  Physical Exam: Blood pressure 102/69, pulse 80, temperature (!) 96.2 F (35.7 C), temperature source Tympanic, resp. rate 18, weight 102 lb 5 oz (46.4 kg). GENERAL:  Well developed, well nourished, woman sitting comfortably in the exam room in no acute distress. MENTAL STATUS:  Alert and oriented to person, place and time. HEAD:  Dark brown wavy hair.  Normocephalic, atraumatic, face symmetric, no Cushingoid features. EYES:  Glasses.  Blue eyes s/p cataract surgery on  the right.  Pupils equal round and reactive to light and accomodation.  No conjunctivitis or scleral icterus. ENT:  Oropharynx clear without lesion.  Tongue normal. Mucous membranes moist.  RESPIRATORY:  Clear to auscultation without rales, wheezes or rhonchi. CARDIOVASCULAR:  Irregular rhythm without murmur, rub or gallop. CHEST:  Well healed midline sternotomy incision.  Pacemaker. ABDOMEN:  Soft, non-tender, with active bowel sounds, and no hepatosplenomegaly.  No masses. SKIN:  Chronic lower extremity pigment changes (brown).  No rashes, ulcers or lesions. EXTREMITIES: Arthritis changes in hands.  Left ganglion cyst.  No edema, no skin discoloration or tenderness.  No palpable cords. LYMPH NODES: No palpable cervical, supraclavicular, axillary or inguinal adenopathy  NEUROLOGICAL: Unremarkable. PSYCH:  Appropriate.   Appointment on 02/24/2017  Component Date Value Ref Range Status  . Sodium 02/24/2017 140  135 - 145 mmol/L Final  . Potassium 02/24/2017 4.2  3.5 - 5.1 mmol/L Final  . Chloride 02/24/2017 106  101 - 111 mmol/L Final  . CO2 02/24/2017 24  22 - 32 mmol/L Final  . Glucose, Bld 02/24/2017 113* 65 - 99 mg/dL Final  . BUN 02/24/2017 22* 6 - 20 mg/dL Final  . Creatinine, Ser 02/24/2017 1.12* 0.44 - 1.00 mg/dL Final  . Calcium 02/24/2017 9.2  8.9 - 10.3 mg/dL Final  . Total Protein 02/24/2017 6.7  6.5 - 8.1 g/dL Final  . Albumin 02/24/2017 4.2  3.5 - 5.0 g/dL Final  . AST 02/24/2017 33   15 - 41 U/L Final  . ALT 02/24/2017 37  14 - 54 U/L Final  . Alkaline Phosphatase 02/24/2017 103  38 - 126 U/L Final  . Total Bilirubin 02/24/2017 0.5  0.3 - 1.2 mg/dL Final  . GFR calc non Af Amer 02/24/2017 43* >60 mL/min Final  . GFR calc Af Amer 02/24/2017 49* >60 mL/min Final   Comment: (NOTE) The eGFR has been calculated using the CKD EPI equation. This calculation has not been validated in all clinical situations. eGFR's persistently <60 mL/min signify possible Chronic Kidney Disease.   . Anion gap 02/24/2017 10  5 - 15 Final  . WBC 02/24/2017 6.5  3.6 - 11.0 K/uL Final  . RBC 02/24/2017 4.15  3.80 - 5.20 MIL/uL Final  . Hemoglobin 02/24/2017 13.8  12.0 - 16.0 g/dL Final  . HCT 02/24/2017 40.3  35.0 - 47.0 % Final  . MCV 02/24/2017 96.9  80.0 - 100.0 fL Final  . MCH 02/24/2017 33.3  26.0 - 34.0 pg Final  . MCHC 02/24/2017 34.4  32.0 - 36.0 g/dL Final  . RDW 02/24/2017 15.0* 11.5 - 14.5 % Final  . Platelets 02/24/2017 218  150 - 440 K/uL Final  . Neutrophils Relative % 02/24/2017 67  % Final  . Neutro Abs 02/24/2017 4.3  1.4 - 6.5 K/uL Final  . Lymphocytes Relative 02/24/2017 17  % Final  . Lymphs Abs 02/24/2017 1.1  1.0 - 3.6 K/uL Final  . Monocytes Relative 02/24/2017 13  % Final  . Monocytes Absolute 02/24/2017 0.9  0.2 - 0.9 K/uL Final  . Eosinophils Relative 02/24/2017 2  % Final  . Eosinophils Absolute 02/24/2017 0.1  0 - 0.7 K/uL Final  . Basophils Relative 02/24/2017 1  % Final  . Basophils Absolute 02/24/2017 0.1  0 - 0.1 K/uL Final  . LDH 02/24/2017 161  98 - 192 U/L Final  . Uric Acid, Serum 02/24/2017 3.5  2.3 - 6.6 mg/dL Final    Assessment:  Joan Hull is a 83  y.o. female with a stage III marginal zone lymphoma.  She presented with a left axillary mass.  Biopsy on 01/15/2016 revealed an atypical lymphoid proliferation highly suspicious for non-Hodgkin's lymphoma.  There were diffuse sheets of B-cells (CD20 and CD79a +; CD5 and CD10-).  In addition,  there was CD138 + abundant plasmacytoid cells with lambda light chain restriction. The lambda staining was apparently present in lymphoid cells.   Lymph node excisional biopsy on 02/06/2016 revealed mature B-cell lymphoma with plasmacytic differentiation, best classified as a marginal zone lymphoma.  PET scan on 01/31/2016 revealed active lymphoma within the chest, abdomen, and pelvis.  The largest lymph node was 1.7 cm.  Hepatitis B core antibody IgM, hepatitis B surface antigen, and hepatitis C antibody were negative on 01/21/2016.  Hepatitis B core antibody was negative on 02/22/2016.  G6PD assay was negative on 02/22/2016.  She developed acute renal insufficiency after initiation of Septra.  Renal ultrasound on 03/04/2016 revealed no evidence of hydronephrosis.  Baseline creatinine is 0.91 - 1.13 (CrCl 44 - 54 ml/min).  Creatinine was 1.54 (CrCl 29 ml/min) on 03/04/2016 and improved to baseline off Septra.  She received weekly Rituxan x 4 (03/11/2016 - 04/01/2016).  She tolerated her infusions well.  PET scan on 05/01/2016 revealed significant partial metabolic response. There was residual mildly hypermetabolic left axillary and mediastinal lymphoma, decreased in size and metabolism. Additional previously described sites of hypermetabolic lymphoma had resolved.  PET scan on 02/20/2017 revealed no evidence for residual hypermetabolic disease in the neck, chest, abdomen, or pelvis.  She is s/p 3 cycles maintenance Rituxan (06/03/2016 - 12/02/2016).  Symptomatically, she denies any B symptoms.  Atrial fibrillation has been uncontrolled. She notes bruising, tenderness, and itching to her bilateral shins. Exam reveals no adenopathy or hepatosplenomegaly.  Uric acid is 3.5 (normal).  Plan: 1.  Labs today:  CBC with diff, CMP, LDH, uric acid, TSH. 2.  Review interval PET scan.  No evidence of metabolic disease. 3.  Continue plan for maintenance Rituxan every 3 months x 2 years. 4.  Cycle #4  maintenance Rituxan today. 5.  Discontinue allopurinol this weekend; 03/01/2017 should be the last dose.  6.  RTC in 12 weeks for MD assessment, labs (CBC with diff, CMP, LDH, uric acid), and cycle #5 maintenance Rituxan.   Lequita Asal, MD  02/24/2017, 10:55 AM

## 2017-02-25 DIAGNOSIS — I495 Sick sinus syndrome: Secondary | ICD-10-CM | POA: Diagnosis not present

## 2017-02-25 DIAGNOSIS — R0602 Shortness of breath: Secondary | ICD-10-CM | POA: Diagnosis not present

## 2017-02-25 DIAGNOSIS — I25810 Atherosclerosis of coronary artery bypass graft(s) without angina pectoris: Secondary | ICD-10-CM | POA: Diagnosis not present

## 2017-02-25 DIAGNOSIS — I10 Essential (primary) hypertension: Secondary | ICD-10-CM | POA: Diagnosis not present

## 2017-02-25 DIAGNOSIS — I4891 Unspecified atrial fibrillation: Secondary | ICD-10-CM | POA: Diagnosis not present

## 2017-02-28 ENCOUNTER — Other Ambulatory Visit: Payer: Self-pay | Admitting: Internal Medicine

## 2017-03-02 NOTE — Telephone Encounter (Signed)
Rx sent through e-scribe  

## 2017-03-02 NOTE — Telephone Encounter (Signed)
Approved: okay for 1 year 

## 2017-03-05 NOTE — Telephone Encounter (Signed)
Erroneous encounter

## 2017-03-10 DIAGNOSIS — R0602 Shortness of breath: Secondary | ICD-10-CM | POA: Diagnosis not present

## 2017-03-13 ENCOUNTER — Other Ambulatory Visit: Payer: Self-pay | Admitting: Family Medicine

## 2017-03-13 NOTE — Telephone Encounter (Signed)
Pt left v/m requesting status of zolpidem refill; pt wants to pick up today.

## 2017-03-13 NOTE — Telephone Encounter (Signed)
Medication phoned to Arbie Cookey at [facility] as instructed. Per DPR left detailed message for pt to ck with pharmacy after couple of hrs.

## 2017-03-13 NOTE — Telephone Encounter (Signed)
plz phone in. 

## 2017-03-14 ENCOUNTER — Other Ambulatory Visit: Payer: Self-pay | Admitting: Family Medicine

## 2017-03-16 NOTE — Telephone Encounter (Signed)
Approved: looks like it was sent in on Friday

## 2017-03-19 DIAGNOSIS — I495 Sick sinus syndrome: Secondary | ICD-10-CM | POA: Diagnosis not present

## 2017-03-19 DIAGNOSIS — I25810 Atherosclerosis of coronary artery bypass graft(s) without angina pectoris: Secondary | ICD-10-CM | POA: Diagnosis not present

## 2017-03-19 DIAGNOSIS — I4891 Unspecified atrial fibrillation: Secondary | ICD-10-CM | POA: Diagnosis not present

## 2017-03-19 DIAGNOSIS — I10 Essential (primary) hypertension: Secondary | ICD-10-CM | POA: Diagnosis not present

## 2017-05-19 ENCOUNTER — Encounter: Payer: Self-pay | Admitting: Hematology and Oncology

## 2017-05-19 ENCOUNTER — Inpatient Hospital Stay (HOSPITAL_BASED_OUTPATIENT_CLINIC_OR_DEPARTMENT_OTHER): Payer: PPO | Admitting: Hematology and Oncology

## 2017-05-19 ENCOUNTER — Other Ambulatory Visit: Payer: Self-pay | Admitting: *Deleted

## 2017-05-19 ENCOUNTER — Other Ambulatory Visit: Payer: Self-pay | Admitting: Hematology and Oncology

## 2017-05-19 ENCOUNTER — Inpatient Hospital Stay: Payer: PPO

## 2017-05-19 ENCOUNTER — Inpatient Hospital Stay: Payer: PPO | Attending: Hematology and Oncology

## 2017-05-19 VITALS — BP 138/70 | HR 60 | Temp 96.9°F | Resp 18 | Wt 101.2 lb

## 2017-05-19 DIAGNOSIS — I2510 Atherosclerotic heart disease of native coronary artery without angina pectoris: Secondary | ICD-10-CM

## 2017-05-19 DIAGNOSIS — E785 Hyperlipidemia, unspecified: Secondary | ICD-10-CM

## 2017-05-19 DIAGNOSIS — Z950 Presence of cardiac pacemaker: Secondary | ICD-10-CM | POA: Insufficient documentation

## 2017-05-19 DIAGNOSIS — Z79899 Other long term (current) drug therapy: Secondary | ICD-10-CM | POA: Diagnosis not present

## 2017-05-19 DIAGNOSIS — Z5112 Encounter for antineoplastic immunotherapy: Secondary | ICD-10-CM

## 2017-05-19 DIAGNOSIS — I252 Old myocardial infarction: Secondary | ICD-10-CM

## 2017-05-19 DIAGNOSIS — I10 Essential (primary) hypertension: Secondary | ICD-10-CM | POA: Diagnosis not present

## 2017-05-19 DIAGNOSIS — C884 Extranodal marginal zone B-cell lymphoma of mucosa-associated lymphoid tissue [MALT-lymphoma]: Secondary | ICD-10-CM | POA: Insufficient documentation

## 2017-05-19 DIAGNOSIS — K219 Gastro-esophageal reflux disease without esophagitis: Secondary | ICD-10-CM | POA: Diagnosis not present

## 2017-05-19 DIAGNOSIS — M1990 Unspecified osteoarthritis, unspecified site: Secondary | ICD-10-CM

## 2017-05-19 DIAGNOSIS — R0602 Shortness of breath: Secondary | ICD-10-CM | POA: Diagnosis not present

## 2017-05-19 DIAGNOSIS — Z87891 Personal history of nicotine dependence: Secondary | ICD-10-CM | POA: Insufficient documentation

## 2017-05-19 DIAGNOSIS — I4891 Unspecified atrial fibrillation: Secondary | ICD-10-CM | POA: Insufficient documentation

## 2017-05-19 DIAGNOSIS — C8580 Other specified types of non-Hodgkin lymphoma, unspecified site: Secondary | ICD-10-CM

## 2017-05-19 DIAGNOSIS — E790 Hyperuricemia without signs of inflammatory arthritis and tophaceous disease: Secondary | ICD-10-CM

## 2017-05-19 LAB — CBC WITH DIFFERENTIAL/PLATELET
Basophils Absolute: 0 10*3/uL (ref 0–0.1)
Basophils Relative: 1 %
Eosinophils Absolute: 0.1 10*3/uL (ref 0–0.7)
Eosinophils Relative: 2 %
HCT: 39.7 % (ref 35.0–47.0)
Hemoglobin: 13.8 g/dL (ref 12.0–16.0)
Lymphocytes Relative: 23 %
Lymphs Abs: 1.3 10*3/uL (ref 1.0–3.6)
MCH: 32.9 pg (ref 26.0–34.0)
MCHC: 34.8 g/dL (ref 32.0–36.0)
MCV: 94.7 fL (ref 80.0–100.0)
Monocytes Absolute: 1.3 10*3/uL — ABNORMAL HIGH (ref 0.2–0.9)
Monocytes Relative: 22 %
Neutro Abs: 3 10*3/uL (ref 1.4–6.5)
Neutrophils Relative %: 52 %
Platelets: 209 10*3/uL (ref 150–440)
RBC: 4.19 MIL/uL (ref 3.80–5.20)
RDW: 14.9 % — ABNORMAL HIGH (ref 11.5–14.5)
WBC: 5.8 10*3/uL (ref 3.6–11.0)

## 2017-05-19 LAB — LACTATE DEHYDROGENASE: LDH: 169 U/L (ref 98–192)

## 2017-05-19 LAB — URIC ACID: Uric Acid, Serum: 6.5 mg/dL (ref 2.3–6.6)

## 2017-05-19 MED ORDER — SODIUM CHLORIDE 0.9 % IV SOLN
500.0000 mg | Freq: Once | INTRAVENOUS | Status: AC
  Administered 2017-05-19: 500 mg via INTRAVENOUS
  Filled 2017-05-19: qty 50

## 2017-05-19 MED ORDER — SODIUM CHLORIDE 0.9 % IV SOLN: 375.0000 mg/m2 | Freq: Once | INTRAVENOUS | Status: DC

## 2017-05-19 MED ORDER — DIPHENHYDRAMINE HCL 25 MG PO CAPS
25.0000 mg | ORAL_CAPSULE | Freq: Once | ORAL | Status: AC
  Administered 2017-05-19: 25 mg via ORAL
  Filled 2017-05-19: qty 1

## 2017-05-19 MED ORDER — ACETAMINOPHEN 325 MG PO TABS
650.0000 mg | ORAL_TABLET | Freq: Once | ORAL | Status: AC
  Administered 2017-05-19: 650 mg via ORAL
  Filled 2017-05-19: qty 2

## 2017-05-19 MED ORDER — SODIUM CHLORIDE 0.9 % IV SOLN
Freq: Once | INTRAVENOUS | Status: AC
  Administered 2017-05-19: 11:00:00 via INTRAVENOUS
  Filled 2017-05-19: qty 1000

## 2017-05-19 NOTE — Progress Notes (Signed)
[facility] day:  05/19/17  Chief Complaint: Joan Hull is a 83 y.o. female with stage III marginal zone lymphoma s/p Rituxan who is seen for for assessment prior to cycle #5 maintenance Rituxan.  HPI:  The patient was last seen in the medical oncology clinic on 02/24/2017.  At that time, she denied any B symptoms.  Atrial fibrillation had been uncontrolled. She noted bruising, tenderness, and itching to her bilateral shins. Exam reveals no adenopathy or hepatosplenomegaly.  CBC was normal.  Uric acid was 3.5 (normal).  PET scan revealed no residual hypermetabolic disease. Allopurinol was discontinued.  She received cycle #4 Rituxan.  During the interim, she has done well.  She notes a little shortness of breath secondary to "leaking heart valve".  She denies any fevers, sweats or weight loss.  She discontinued allopurinol after her last visit.   Past Medical History:  Diagnosis Date  . Anxiety   . Arthritis   . Atrial fibrillation ([place])   . CAD (coronary artery disease)   . Cancer [facility]) 2017   lymphoma  . Depression   . Dysrhythmia   . GERD (gastroesophageal reflux disease)   . Heart murmur   . History of immunotherapy 02/20/2017  . Hx of colonic polyps   . Hyperlipidemia   . Hypertension   . Interstitial cystitis   . Non Q wave myocardial infarction ([place]) 06/27/11   [facility]  . Osteoporosis   . Presence of permanent cardiac pacemaker   . Shortness of breath dyspnea    doe    Past Surgical History:  Procedure Laterality Date  . CARDIAC CATHETERIZATION    . CATARACT EXTRACTION Right 2005  . COLONOSCOPY  2007   [name]  . CORONARY ANGIOPLASTY    . CORONARY ARTERY BYPASS GRAFT  1/13   [facility]  . CORONARY STENT PLACEMENT  7/12   RCA with drug eluting stent---[name]  . EYE SURGERY    . INSERT / REPLACE / REMOVE PACEMAKER    . PACEMAKER INSERTION  5/13   [facility]    Family History  Problem Relation Age of Onset  .  Heart disease Mother        cad  . Hypertension Mother   . Heart disease Sister        half sister MI  . Cancer Sister        BRAIN  . Heart disease Father        heart attack  . Heart disease Brother        cabg  . Heart disease Brother   . Cancer Brother        BLADDER  . Cancer Sister 74       OVARIAN    Social History:  reports that she quit smoking about 54 years ago. she has never used smokeless tobacco. She reports that she does not drink alcohol or use drugs.  She smoked when she was young (quit in 1964).  She lives alone in [place].  Her son lives in [place], [place]. The patient is alone today.  Allergies:  Allergies  Allergen Reactions  . Aspirin Other (See Comments)    Bladder spasm  . Diazepam     REACTION: worsened bladder problems  . Hydrochlorothiazide Other (See Comments)  . Chlorhexidine Rash    Skin reacts only to scrub    Current Medications: Current Outpatient Medications  Medication Sig Dispense Refill  . acyclovir (ZOVIRAX) 400 MG tablet  Take 400 mg by mouth continuous as needed.    Marland Kitchen allopurinol (ZYLOPRIM) 300 MG tablet Take 1 tablet (300 mg total) by mouth daily. 90 tablet 2  . atenolol (TENORMIN) 25 MG tablet Take 25 mg by mouth daily.    . cholecalciferol (VITAMIN D) 1000 units tablet Take 1,000 Units by mouth daily.    Marland Kitchen diltiazem (CARDIZEM CD) 180 MG 24 hr capsule Take 180 mg by mouth daily.   5  . ELIQUIS 2.5 MG TABS tablet Take 2.5 mg by mouth 2 (two) times daily.     . famotidine (PEPCID) 20 MG tablet Take 20 mg by mouth 2 (two) times daily.    . metoprolol (LOPRESSOR) 50 MG tablet TAKE ONE-HALF TABLET BY MOUTH TWICE DAILY 90 tablet 3  . Multiple Vitamins-Minerals (CENTRUM SILVER 50+WOMEN PO) Take 1 tablet by mouth.    Marland Kitchen PARoxetine (PAXIL) 10 MG tablet TAKE ONE TABLET BY MOUTH ONCE DAILY 90 tablet 3  . Probiotic Product (PROBIOTIC DAILY PO) Take 1 capsule by mouth daily.     . vitamin B-12 (CYANOCOBALAMIN) 100 MCG tablet Take 100 mcg  by mouth daily.    Marland Kitchen torsemide (DEMADEX) 20 MG tablet TAKE ONE-HALF TABLET BY MOUTH ONCE DAILY 45 tablet 0  . zolpidem (AMBIEN) 5 MG tablet Take 1 tablet (5 mg total) by mouth at bedtime as needed. 90 tablet 0   No current facility-administered medications for this visit.     Review of Systems:  GENERAL:  Feels good.  No fevers or sweats.  Weight down 1 pound. PERFORMANCE STATUS (ECOG):  1 HEENT:  No visual changes, runny nose, sore throat, mouth sores or tenderness. Lungs: Shortness of breath with exertion (see HPI).  Lingering cough.  No hemoptysis. Cardiac:  Atrial fibrillation.  No chest pain, palpitations, orthopnea, or PND. GI:  Appetite good.  No nausea, vomiting, diarrhea, constipation, melena or hematochezia. GU:  No urgency, frequency, dysuria, or hematuria. Musculoskeletal:  No back pain.  No joint pain.  No muscle tenderness. Extremities:  Arthritis.  No lower extremity pain or swelling. Skin: Bruising, itching, and tender shins.  No rashes or skin changes. Neuro:  No headache, numbness or weakness, balance or coordination issues. Endocrine:  No diabetes, thyroid issues, hot flashes or night sweats. Psych:  No mood changes, depression or anxiety. Pain:  No focal pain. Review of systems:  All other systems reviewed and found to be negative.  Physical Exam: Blood pressure 138/70, pulse 60, temperature (!) 96.9 F (36.1 C), temperature source Tympanic, resp. rate 18, weight 101 lb 4 oz (45.9 kg). GENERAL:  Well developed, well nourished, woman sitting comfortably in the exam room in no acute distress. MENTAL STATUS:  Alert and oriented to person, place and time. HEAD:  Dark brown wavy hair.  Normocephalic, atraumatic, face symmetric, no Cushingoid features. EYES:  Glasses.  Blue eyes s/p cataract surgery on the right.  Pupils equal round and reactive to light and accomodation.  No conjunctivitis or scleral icterus. ENT:  Oropharynx clear without lesion.  Tongue normal. Mucous  membranes moist.  RESPIRATORY:  Clear to auscultation without rales, wheezes or rhonchi. CARDIOVASCULAR:  Irregular rhythm without murmur, rub or gallop. CHEST:  Well healed midline sternotomy incision.  Pacemaker. ABDOMEN:  Soft, non-tender, with active bowel sounds, and no hepatosplenomegaly.  No masses. SKIN:  Chronic lower extremity pigment changes (brown).  No vesicles.  No rashes, ulcers or lesions. EXTREMITIES: Arthritis changes in hands.  Left ganglion cyst.  No edema, no skin  discoloration or tenderness.  No palpable cords. LYMPH NODES: No palpable cervical, supraclavicular, axillary or inguinal adenopathy  NEUROLOGICAL: Unremarkable. PSYCH:  Appropriate.   Appointment on 05/19/2017  Component Date Value Ref Range Status  . CA 27.29 05/19/2017 14.1  0.0 - 38.6 U/mL Final   Comment: (NOTE) Bayer Cent[facility]/ACS methodology Performed At: [facility] [place], [place] [id number] Lindon Romp MD JO:[id number]   . WBC 05/19/2017 5.8  3.6 - 11.0 K/uL Final  . RBC 05/19/2017 4.19  3.80 - 5.20 MIL/uL Final  . Hemoglobin 05/19/2017 13.8  12.0 - 16.0 g/dL Final  . HCT 05/19/2017 39.7  35.0 - 47.0 % Final  . MCV 05/19/2017 94.7  80.0 - 100.0 fL Final  . MCH 05/19/2017 32.9  26.0 - 34.0 pg Final  . MCHC 05/19/2017 34.8  32.0 - 36.0 g/dL Final  . RDW 05/19/2017 14.9* 11.5 - 14.5 % Final  . Platelets 05/19/2017 209  150 - 440 K/uL Final  . Neutrophils Relative % 05/19/2017 52  % Final  . Neutro Abs 05/19/2017 3.0  1.4 - 6.5 K/uL Final  . Lymphocytes Relative 05/19/2017 23  % Final  . Lymphs Abs 05/19/2017 1.3  1.0 - 3.6 K/uL Final  . Monocytes Relative 05/19/2017 22  % Final  . Monocytes Absolute 05/19/2017 1.3* 0.2 - 0.9 K/uL Final  . Eosinophils Relative 05/19/2017 2  % Final  . Eosinophils Absolute 05/19/2017 0.1  0 - 0.7 K/uL Final  . Basophils Relative 05/19/2017 1  % Final  . Basophils Absolute 05/19/2017 0.0  0 - 0.1 K/uL Final  . LDH 05/19/2017 169   98 - 192 U/L Final  . Uric Acid, Serum 05/19/2017 6.5  2.3 - 6.6 mg/dL Final    Assessment:  XOCHILTH STANDISH is a 83 y.o. female with a stage III marginal zone lymphoma.  She presented with a left axillary mass.  Biopsy on 01/15/2016 revealed an atypical lymphoid proliferation highly suspicious for non-Hodgkin's lymphoma.  There were diffuse sheets of B-cells (CD20 and CD79a +; CD5 and CD10-).  In addition, there was CD138 + abundant plasmacytoid cells with lambda light chain restriction. The lambda staining was apparently present in lymphoid cells.   Lymph node excisional biopsy on 02/06/2016 revealed mature B-cell lymphoma with plasmacytic differentiation, best classified as a marginal zone lymphoma.  PET scan on 01/31/2016 revealed active lymphoma within the chest, abdomen, and pelvis.  The largest lymph node was 1.7 cm.  Hepatitis B core antibody IgM, hepatitis B surface antigen, and hepatitis C antibody were negative on 01/21/2016.  Hepatitis B core antibody was negative on 02/22/2016.  G6PD assay was negative on 02/22/2016.  She developed acute renal insufficiency after initiation of Septra.  Renal ultrasound on 03/04/2016 revealed no evidence of hydronephrosis.  Baseline creatinine is 0.91 - 1.13 (CrCl 44 - 54 ml/min).  Creatinine was 1.54 (CrCl 29 ml/min) on 03/04/2016 and improved to baseline off Septra.  She received weekly Rituxan x 4 (03/11/2016 - 04/01/2016).  She tolerated her infusions well.  PET scan on 05/01/2016 revealed significant partial metabolic response. There was residual mildly hypermetabolic left axillary and mediastinal lymphoma, decreased in size and metabolism. Additional previously described sites of hypermetabolic lymphoma had resolved.  PET scan on 02/20/2017 revealed no evidence for residual hypermetabolic disease in the neck, chest, abdomen, or pelvis.  She is s/p 4 cycles maintenance Rituxan (06/03/2016 - 02/24/2017).  Symptomatically, she denies any B symptoms.   Exam reveals no adenopathy or hepatosplenomegaly.  Uric acid is 3.5 (normal).  Plan: 1.  Labs today:  CBC with diff, CMP, LDH, uric acid. 2.  Cycle #5 maintenance Rituxan today. 3.  Continue plan for maintenance Rituxan every 3 months x 2 years. 4.  RTC in 12 weeks for MD assessment, labs (CBC with diff, CMP, LDH, uric acid), and cycle #6 maintenance Rituxan.   Lequita Asal, MD  05/19/2017, 4:45 PM

## 2017-05-19 NOTE — Progress Notes (Signed)
Patient states she has little blisters that come up on her legs.  Otherwise, offers no complaints.

## 2017-05-20 LAB — CANCER ANTIGEN 27.29: CA 27.29: 14.1 U/mL (ref 0.0–38.6)

## 2017-05-29 ENCOUNTER — Other Ambulatory Visit: Payer: Self-pay | Admitting: Internal Medicine

## 2017-06-10 ENCOUNTER — Other Ambulatory Visit: Payer: Self-pay | Admitting: *Deleted

## 2017-06-10 ENCOUNTER — Other Ambulatory Visit: Payer: Self-pay | Admitting: Family Medicine

## 2017-06-10 NOTE — Telephone Encounter (Signed)
Last filled 03-13-17 #90 Last OV 06-23-16 Next OV 06-26-17

## 2017-06-10 NOTE — Telephone Encounter (Signed)
Copied from [place] #[id number]. Topic: Quick Communication - See Telephone Encounter >> Jun 10, 2017 12:56 PM Aurelio Brash B wrote: CRM for notification. See Telephone encounter for:  06/10/17.  Refill on zolpidem 5 mg  Prescribed by Dr Danise Mina

## 2017-06-11 ENCOUNTER — Telehealth: Payer: Self-pay | Admitting: Family Medicine

## 2017-06-11 MED ORDER — ZOLPIDEM TARTRATE 5 MG PO TABS: 5.0000 mg | ORAL_TABLET | Freq: Every evening | ORAL | 0 refills | Status: DC | PRN

## 2017-06-11 NOTE — Telephone Encounter (Signed)
Pt needs clarification on Zolpidem Rx.

## 2017-06-11 NOTE — Telephone Encounter (Signed)
Left refill on voice mail at pharmacy Tried to call pt. Left message

## 2017-06-11 NOTE — Telephone Encounter (Signed)
Copied from [place] #[id number]. Topic: Quick Communication - See Telephone Encounter >> Jun 11, 2017 10:11 AM Bea Graff, NT wrote: CRM for notification. See Telephone encounter for:  06/11/17. Patient called and stated she called the pharmacy about her zolpidem 5mg  that looks like it was called in yesterday for a refill. They told her to call the office that there was a problem on our end. The pt is almost out of this medicine and needs it to sleep. Please give pt a call.

## 2017-06-11 NOTE — Telephone Encounter (Signed)
Approved: #90 x 0 

## 2017-06-11 NOTE — Telephone Encounter (Signed)
Patient called- she states that Pharmacy has not received rx, pt is almost out of medication.

## 2017-06-26 ENCOUNTER — Encounter: Payer: Self-pay | Admitting: Internal Medicine

## 2017-06-26 ENCOUNTER — Ambulatory Visit (INDEPENDENT_AMBULATORY_CARE_PROVIDER_SITE_OTHER)
Admission: RE | Admit: 2017-06-26 | Discharge: 2017-06-26 | Disposition: A | Discharge status: Home / Self Care | Payer: PPO | Source: Ambulatory Visit | Attending: Internal Medicine | Admitting: Internal Medicine

## 2017-06-26 ENCOUNTER — Ambulatory Visit (INDEPENDENT_AMBULATORY_CARE_PROVIDER_SITE_OTHER): Payer: PPO | Admitting: Internal Medicine

## 2017-06-26 VITALS — BP 128/70 | HR 60 | Temp 97.4°F | Ht <= 58 in | Wt 101.0 lb

## 2017-06-26 DIAGNOSIS — J439 Emphysema, unspecified: Secondary | ICD-10-CM

## 2017-06-26 DIAGNOSIS — I25119 Atherosclerotic heart disease of native coronary artery with unspecified angina pectoris: Secondary | ICD-10-CM

## 2017-06-26 DIAGNOSIS — Z7189 Other specified counseling: Secondary | ICD-10-CM

## 2017-06-26 DIAGNOSIS — F39 Unspecified mood [affective] disorder: Secondary | ICD-10-CM | POA: Diagnosis not present

## 2017-06-26 DIAGNOSIS — R0602 Shortness of breath: Secondary | ICD-10-CM | POA: Diagnosis not present

## 2017-06-26 DIAGNOSIS — I480 Paroxysmal atrial fibrillation: Secondary | ICD-10-CM

## 2017-06-26 DIAGNOSIS — Z23 Encounter for immunization: Secondary | ICD-10-CM | POA: Diagnosis not present

## 2017-06-26 DIAGNOSIS — Z0000 Encounter for general adult medical examination without abnormal findings: Secondary | ICD-10-CM

## 2017-06-26 DIAGNOSIS — C8580 Other specified types of non-Hodgkin lymphoma, unspecified site: Secondary | ICD-10-CM

## 2017-06-26 NOTE — Assessment & Plan Note (Signed)
See social history 

## 2017-06-26 NOTE — Assessment & Plan Note (Addendum)
Has exertional dyspnea--some better with increased atenolol Echo reassuring Continues with Dr Milda Smart of statins

## 2017-06-26 NOTE — Progress Notes (Signed)
Subjective:    Patient ID: Joan Hull, female    DOB: [birth year], 83 y.o.   MRN: [id number]  HPI Here for Medicare wellness visit and follow up of chronic health conditions Reviewed form and advanced directives Reviewed other doctors No alcohol or tobacco Walks occasionally Vision problems on left --may need cataract removed Mild hearing issues--nothing concerning No falls--is careful Independent with instrumental ADLs No sig memory problems  Main concern is her SOB This goes back for many months Was having racing heart Feels better on higher dose of atenolol--- now 75mg  daily No chest pain No dizziness or syncope No edema Continues on the eliquis for a fib  Some cough Occasional mucus  Diagnosed with lymphoma Tolerating the rituxan and doing well Gets Rx every 3 months now Off allopurinol Appetite is fine and weight stable  Anxiety is controlled on paxil No persistent depression and no anhedonia  Current Outpatient Medications on File Prior to Visit  Medication Sig Dispense Refill  . acyclovir (ZOVIRAX) 400 MG tablet Take 400 mg by mouth continuous as needed.    Marland Kitchen atenolol (TENORMIN) 25 MG tablet Take 25 mg 2 (two) times daily by mouth. 50--AM 25--PM    . cholecalciferol (VITAMIN D) 1000 units tablet Take 1,000 Units by mouth daily.    Marland Kitchen diltiazem (CARDIZEM CD) 180 MG 24 hr capsule Take 180 mg by mouth daily.   5  . ELIQUIS 2.5 MG TABS tablet Take 2.5 mg by mouth 2 (two) times daily.     . Multiple Vitamins-Minerals (CENTRUM SILVER 50+WOMEN PO) Take 1 tablet by mouth.    Marland Kitchen PARoxetine (PAXIL) 10 MG tablet TAKE ONE TABLET BY MOUTH ONCE DAILY 90 tablet 3  . Probiotic Product (PROBIOTIC DAILY PO) Take 1 capsule by mouth daily.     Marland Kitchen torsemide (DEMADEX) 20 MG tablet TAKE ONE-HALF TABLET BY MOUTH ONCE DAILY 45 tablet 0  . vitamin B-12 (CYANOCOBALAMIN) 100 MCG tablet Take 100 mcg by mouth daily.    Marland Kitchen zolpidem (AMBIEN) 5 MG tablet Take 1 tablet (5 mg total) by  mouth at bedtime as needed. 90 tablet 0  . allopurinol (ZYLOPRIM) 300 MG tablet Take 1 tablet (300 mg total) by mouth daily. 90 tablet 2   No current facility-administered medications on file prior to visit.     Allergies  Allergen Reactions  . Aspirin Other (See Comments)    Bladder spasm  . Diazepam     REACTION: worsened bladder problems  . Hydrochlorothiazide Other (See Comments)  . Chlorhexidine Rash    Skin reacts only to scrub    Past Medical History:  Diagnosis Date  . Anxiety   . Arthritis   . Atrial fibrillation ([place])   . CAD (coronary artery disease)   . Cancer [facility]) 2017   lymphoma  . Depression   . Dysrhythmia   . GERD (gastroesophageal reflux disease)   . Heart murmur   . History of immunotherapy 02/20/2017  . Hx of colonic polyps   . Hyperlipidemia   . Hypertension   . Interstitial cystitis   . Non Q wave myocardial infarction ([place]) 06/27/11   [facility]  . Osteoporosis   . Presence of permanent cardiac pacemaker   . Shortness of breath dyspnea    doe    Past Surgical History:  Procedure Laterality Date  . CARDIAC CATHETERIZATION    . CATARACT EXTRACTION Right 2005  . COLONOSCOPY  2007   [name]  . CORONARY ANGIOPLASTY    .  CORONARY ARTERY BYPASS GRAFT  1/13   [facility]  . CORONARY STENT PLACEMENT  7/12   RCA with drug eluting stent---[name]  . EYE SURGERY    . INSERT / REPLACE / REMOVE PACEMAKER    . PACEMAKER INSERTION  5/13   [facility]    Family History  Problem Relation Age of Onset  . Heart disease Mother        cad  . Hypertension Mother   . Heart disease Sister        half sister MI  . Cancer Sister        BRAIN  . Heart disease Father        heart attack  . Heart disease Brother        cabg  . Heart disease Brother   . Cancer Brother        BLADDER  . Cancer Sister 36       OVARIAN    Social History   Socioeconomic History  . Marital status: Widowed    Spouse name: Not on file  . Number of children: 1  . Years of  education: Not on file  . Highest education level: Not on file  Social Needs  . Financial resource strain: Not on file  . Food insecurity - worry: Not on file  . Food insecurity - inability: Not on file  . Transportation needs - medical: Not on file  . Transportation needs - non-medical: Not on file  Occupational History  . 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 Smoking status: Former Smoker    Last attempt to quit: 08/18/1962    Years since quitting: 54.8  . Smokeless tobacco: Never Used  Substance and Sexual Activity  . Alcohol use: No    Alcohol/week: 0.0 oz  . Drug use: No  . Sexual activity: Not on file  Other Topics Concern  . Not on file  Social History Narrative   Now has living will   Son [name] is her health care POA. Alternate is sister [name].   Would accept resuscitation attempts--but no prolonged ventilation   No tube feeds if cognitively unaware   Review of Systems  Still needs the ambien for sleep Appetite is okay ----weight stable Balance not great  Indigestion is controlled with pepcid. No dysphagia Gets some abdominal swelling---controlled with fluid pill Wears seat belt No problems with teeth--keeps up Bowels are slow--takes 1/2 dulcolax every other day with good effect. No blood Rare streak of blood on TP Voids okay---not that often though Some right leg pain or shoulder---tylenol at night helps that Chronic skin changes--nothing suspicious    Objective:   Physical Exam  Constitutional: She is oriented to person, place, and time. She appears well-nourished. No distress.  HENT:  Mouth/Throat: Oropharynx is clear and moist. No oropharyngeal exudate.  Neck: No thyromegaly present.  Cardiovascular: Normal rate, regular rhythm, normal heart sounds and intact distal pulses. Exam reveals no gallop.  No murmur heard. Pulmonary/Chest: Effort normal. No respiratory distress. She has no wheezes. She has no rales.  ?slightly  bronchial sounds  Abdominal: Soft. There is no tenderness.  Musculoskeletal: She exhibits no edema.  Lymphadenopathy:    She has no cervical adenopathy.  Neurological: She is alert and oriented to person, place, and time.  President--- "Daisy Floro, Obama, Bush" [phone] that's wrong D-l-r-o-w Recall 3/3  Skin: No erythema.  Stasis changes in calves  Psychiatric: She has a normal mood and affect. Her  behavior is normal.          Assessment & Plan:

## 2017-06-26 NOTE — Progress Notes (Signed)
Hearing Screening   Method: Audiometry   125Hz  250Hz  500Hz  1000Hz  2000Hz  3000Hz  4000Hz  6000Hz  8000Hz   Right ear:   40 40 40  0    Left ear:   20 20 20  20       Visual Acuity Screening   Right eye Left eye Both eyes  Without correction:     With correction: 20/25 20/50 20/25

## 2017-06-26 NOTE — Assessment & Plan Note (Signed)
Regular now On eliquis 

## 2017-06-26 NOTE — Assessment & Plan Note (Signed)
I have personally reviewed the Medicare Annual Wellness questionnaire and have noted 1. The patient's medical and social history 2. Their use of alcohol, tobacco or illicit drugs 3. Their current medications and supplements 4. The patient's functional ability including ADL's, fall risks, home safety risks and hearing or visual             impairment. 5. Diet and physical activities 6. Evidence for depression or mood disorders  The patients weight, height, BMI and visual acuity have been recorded in the chart I have made referrals, counseling and provided education to the patient based review of the above and I have provided the pt with a written personalized care plan for preventive services.  I have provided you with a copy of your personalized plan for preventive services. Please take the time to review along with your updated medication list.  Will update pneumovax Prefers no flu vaccine No cancer screening due to age Discussed activity

## 2017-06-26 NOTE — Assessment & Plan Note (Signed)
Tolerating the rituxan

## 2017-06-26 NOTE — Addendum Note (Signed)
Addended by: Pilar Grammes on: 06/26/2017 12:45 PM   Modules accepted: Orders

## 2017-06-26 NOTE — Assessment & Plan Note (Signed)
Mostly chronic anxiety Okay on paroxetine and needs ambien for sleep

## 2017-06-26 NOTE — Assessment & Plan Note (Signed)
Sig emphysematous changes even 5 years ago and more Exertional dyspnea probably multifactorial Will check CXR to be sure nothing worrisome No Rx for now

## 2017-06-29 ENCOUNTER — Ambulatory Visit: Payer: Self-pay | Admitting: *Deleted

## 2017-06-29 ENCOUNTER — Telehealth: Payer: Self-pay | Admitting: Internal Medicine

## 2017-06-29 NOTE — Telephone Encounter (Signed)
She should ice it and I will squeeze her in tomorrow morning if need be

## 2017-06-29 NOTE — Telephone Encounter (Signed)
Tried to call pt. No answer. No VM. 

## 2017-06-29 NOTE — Telephone Encounter (Addendum)
Charted in error; see telephone encounter

## 2017-06-29 NOTE — Telephone Encounter (Signed)
Pt complains of Right arm soreness and swelling after getting injection in right arm on Friday 06/26/17 (not sure if it was the pneumonia shot or the flu shot); Pt states that it has gotten better but she still can't raise her right arm; Pt had declined appointments offered today, and tomorrow; Conference call initiated between the pt, Rena and me; pt accepted appointment on with Carlean Purl on Jul 01, 2017 at 230.

## 2017-06-29 NOTE — Telephone Encounter (Signed)
[place]    An  appt  Today  With  Dr    [name]    Pt  Refused   Stating  She  Had  No  Way   Rena  Then offered  Her  An  appt  For  tommorow  She  Again refused   She  Stated   She  Will  Let us  Know  tommorow     Reason for Disposition . [1] Pain, tenderness, or swelling at the injection site AND [2] persists > 3 days . [1] Over 3 days (72 hours) since shot AND [2] redness, swelling or pain getting worse  Answer Assessment - Initial Assessment Questions 1. SYMPTOMS: "What is the main symptom?" (e.g., redness, swelling, pain)      Pain   And   Swelling   r   Arm  yest  It   Was   Size  Of  Baseball  It   Getting  Better   Still  Has  Pain   When  She  Raises  It    2. ONSET: "When was the vaccine (shot) given?" "How much later did the _____3days_____ begin?" (e.g., hours, days ago)      Started  Bad   After  [employer]   3. SEVERITY: "How bad is it?"       Worse on  Movement   4. FEVER: "Is there a fever?" If so, ask: "What is it, how was it measured, and when did it start?"       None  5. IMMUNIZATIONS GIVEN: "What shots have you recently received?"      pnuemonia   Shot  r  Arm   Flu  Shot  l  6. PAST REACTIONS: "Have you reacted to immunizations before?" If so, ask: "What happened?"      Never   7. OTHER SYMPTOMS: "Do you have any other symptoms?"      Only  Pain  And   Swelling  Protocols used: IMMUNIZATION REACTIONS-A-AH

## 2017-06-30 NOTE — Telephone Encounter (Signed)
That is fine. I expected it should improve with just ice and time--so no visit should be needed

## 2017-06-30 NOTE — Telephone Encounter (Signed)
Spoke to pt. She said the swelling has gone done quite a bit and still having some soreness. She does not feel sick. She cannot take NSAIDS. She told me to cancel her appt for tomorrow.

## 2017-07-01 ENCOUNTER — Ambulatory Visit: Payer: PPO | Admitting: Family Medicine

## 2017-08-10 ENCOUNTER — Other Ambulatory Visit: Payer: PPO

## 2017-08-10 ENCOUNTER — Ambulatory Visit: Payer: PPO

## 2017-08-10 ENCOUNTER — Ambulatory Visit: Payer: PPO | Admitting: Hematology and Oncology

## 2017-08-17 ENCOUNTER — Inpatient Hospital Stay: Payer: PPO

## 2017-08-17 ENCOUNTER — Inpatient Hospital Stay: Payer: PPO | Admitting: Hematology and Oncology

## 2017-08-17 NOTE — Progress Notes (Deleted)
[facility] day:  05/19/17  Chief Complaint: Joan Hull is a 83 y.o. 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  HPI:  The patient was last seen in the medical oncology clinic on 05/19/2017.  At that time, patient was doing well. She complained of some minor shortness of breath to which she attributed to a "leakin heart valve". Patient denied B symptoms or recent infections. She was no longer taking her Allopurinol. Exam was stable. Labs were unremarkable. She received cycle #5 maintenance Rituxan.  During the interim,    Past Medical History:  Diagnosis Date  . Anxiety   . Arthritis   . Atrial fibrillation ([place])   . CAD (coronary artery disease)   . Cancer [facility]) 2017   lymphoma  . Depression   . Dysrhythmia   . GERD (gastroesophageal reflux disease)   . Heart murmur   . History of immunotherapy 02/20/2017  . Hx of colonic polyps   . Hyperlipidemia   . Hypertension   . Interstitial cystitis   . Non Q wave myocardial infarction ([place]) 06/27/11   [facility]  . Osteoporosis   . Presence of permanent cardiac pacemaker   . Shortness of breath dyspnea    doe    Past Surgical History:  Procedure Laterality Date  . AXILLARY LYMPH NODE BIOPSY Left 02/06/2016   Procedure: AXILLARY LYMPH NODE BIOPSY;  Surgeon: [name], MD;  Location: [facility] ORS;  Service: General;  Laterality: Left;  . CARDIAC CATHETERIZATION    . CATARACT EXTRACTION Right 2005  . COLONOSCOPY  2007   [name]  . CORONARY ANGIOPLASTY    . CORONARY ARTERY BYPASS GRAFT  1/13   [facility]  . CORONARY STENT PLACEMENT  7/12   RCA with drug eluting stent---[name]  . EYE SURGERY    . INSERT / REPLACE / REMOVE PACEMAKER    . PACEMAKER INSERTION  5/13   [facility]    Family History  Problem Relation Age of Onset  . Heart disease Mother        cad  . Hypertension Mother   .  Heart disease Sister        half sister MI  . Cancer Sister        BRAIN  . Heart disease Father        heart attack  . Heart disease Brother        cabg  . Heart disease Brother   . Cancer Brother        BLADDER  . Cancer Sister 49       OVARIAN    Social History:  reports that she quit smoking about 55 years ago. she has never used smokeless tobacco. She reports that she does not drink alcohol or use drugs.  She smoked when she was young (quit in 1964).  She lives alone in [place].  Her son lives in [place], [place]. The patient is alone today.  Allergies:  Allergies  Allergen Reactions  . Aspirin Other (See Comments)    Bladder spasm  . Diazepam     REACTION: worsened bladder problems  . Hydrochlorothiazide Other (See Comments)  . Chlorhexidine Rash    Skin reacts only to scrub    Current Medications: Current Outpatient Medications  Medication Sig Dispense Refill  . acyclovir (ZOVIRAX) 400 MG tablet Take 400 mg by mouth continuous as  needed.    Marland Kitchen atenolol (TENORMIN) 25 MG tablet Take 25 mg 2 (two) times daily by mouth. 50--AM 25--PM    . cholecalciferol (VITAMIN D) 1000 units tablet Take 1,000 Units by mouth daily.    Marland Kitchen diltiazem (CARDIZEM CD) 180 MG 24 hr capsule Take 180 mg by mouth daily.   5  . ELIQUIS 2.5 MG TABS tablet Take 2.5 mg by mouth 2 (two) times daily.     . Multiple Vitamins-Minerals (CENTRUM SILVER 50+WOMEN PO) Take 1 tablet by mouth.    Marland Kitchen PARoxetine (PAXIL) 10 MG tablet TAKE ONE TABLET BY MOUTH ONCE DAILY 90 tablet 3  . Probiotic Product (PROBIOTIC DAILY PO) Take 1 capsule by mouth daily.     Marland Kitchen torsemide (DEMADEX) 20 MG tablet TAKE ONE-HALF TABLET BY MOUTH ONCE DAILY 45 tablet 0  . vitamin B-12 (CYANOCOBALAMIN) 100 MCG tablet Take 100 mcg by mouth daily.    Marland Kitchen zolpidem (AMBIEN) 5 MG tablet Take 1 tablet (5 mg total) by mouth at bedtime as needed. 90 tablet 0   No current facility-administered medications for this visit.     Review of Systems:   GENERAL:  Feels good.  No fevers or sweats.  Weight down 1 pound. PERFORMANCE STATUS (ECOG):  1 HEENT:  No visual changes, runny nose, sore throat, mouth sores or tenderness. Lungs: Shortness of breath with exertion (see HPI).  Lingering cough.  No hemoptysis. Cardiac:  Atrial fibrillation.  No chest pain, palpitations, orthopnea, or PND. GI:  Appetite good.  No nausea, vomiting, diarrhea, constipation, melena or hematochezia. GU:  No urgency, frequency, dysuria, or hematuria. Musculoskeletal:  No back pain.  No joint pain.  No muscle tenderness. Extremities:  Arthritis.  No lower extremity pain or swelling. Skin: Bruising, itching, and tender shins.  No rashes or skin changes. Neuro:  No headache, numbness or weakness, balance or coordination issues. Endocrine:  No diabetes, thyroid issues, hot flashes or night sweats. Psych:  No mood changes, depression or anxiety. Pain:  No focal pain. Review of systems:  All other systems reviewed and found to be negative.  Physical Exam: There were no vitals taken for this visit. GENERAL:  Well developed, well nourished, woman sitting comfortably in the exam room in no acute distress. MENTAL STATUS:  Alert and oriented to person, place and time. HEAD:  Dark brown wavy hair.  Normocephalic, atraumatic, face symmetric, no Cushingoid features. EYES:  Glasses.  Blue eyes s/p cataract surgery on the right.  Pupils equal round and reactive to light and accomodation.  No conjunctivitis or scleral icterus. ENT:  Oropharynx clear without lesion.  Tongue normal. Mucous membranes moist.  RESPIRATORY:  Clear to auscultation without rales, wheezes or rhonchi. CARDIOVASCULAR:  Irregular rhythm without murmur, rub or gallop. CHEST:  Well healed midline sternotomy incision.  Pacemaker. ABDOMEN:  Soft, non-tender, with active bowel sounds, and no hepatosplenomegaly.  No masses. SKIN:  Chronic lower extremity pigment changes (brown).  No vesicles.  No rashes, ulcers  or lesions. EXTREMITIES: Arthritis changes in hands.  Left ganglion cyst.  No edema, no skin discoloration or tenderness.  No palpable cords. LYMPH NODES: No palpable cervical, supraclavicular, axillary or inguinal adenopathy  NEUROLOGICAL: Unremarkable. PSYCH:  Appropriate.   No visits with results within 3 Day(s) from this visit.  Latest known visit with results is:  Appointment on 05/19/2017  Component Date Value Ref Range Status  . CA 27.29 05/19/2017 14.1  0.0 - 38.6 U/mL Final   Comment: (NOTE) [facility]/ACS methodology  Performed At: [facility] [place], [place] [id number] Lindon Romp MD [id number]   . WBC 05/19/2017 5.8  3.6 - 11.0 K/uL Final  . RBC 05/19/2017 4.19  3.80 - 5.20 MIL/uL Final  . Hemoglobin 05/19/2017 13.8  12.0 - 16.0 g/dL Final  . HCT 05/19/2017 39.7  35.0 - 47.0 % Final  . MCV 05/19/2017 94.7  80.0 - 100.0 fL Final  . MCH 05/19/2017 32.9  26.0 - 34.0 pg Final  . MCHC 05/19/2017 34.8  32.0 - 36.0 g/dL Final  . RDW 05/19/2017 14.9* 11.5 - 14.5 % Final  . Platelets 05/19/2017 209  150 - 440 K/uL Final  . Neutrophils Relative % 05/19/2017 52  % Final  . Neutro Abs 05/19/2017 3.0  1.4 - 6.5 K/uL Final  . Lymphocytes Relative 05/19/2017 23  % Final  . Lymphs Abs 05/19/2017 1.3  1.0 - 3.6 K/uL Final  . Monocytes Relative 05/19/2017 22  % Final  . Monocytes Absolute 05/19/2017 1.3* 0.2 - 0.9 K/uL Final  . Eosinophils Relative 05/19/2017 2  % Final  . Eosinophils Absolute 05/19/2017 0.1  0 - 0.7 K/uL Final  . Basophils Relative 05/19/2017 1  % Final  . Basophils Absolute 05/19/2017 0.0  0 - 0.1 K/uL Final  . LDH 05/19/2017 169  98 - 192 U/L Final  . Uric Acid, Serum 05/19/2017 6.5  2.3 - 6.6 mg/dL Final    Assessment:  HAADIYA FROGGE is a 83 y.o. female with a stage III marginal zone lymphoma.  She presented with a left axillary mass.  Biopsy on 01/15/2016 revealed an atypical lymphoid proliferation highly suspicious for  non-Hodgkin's lymphoma.  There were diffuse sheets of B-cells (CD20 and CD79a +; CD5 and CD10-).  In addition, there was CD138 + abundant plasmacytoid cells with lambda light chain restriction. The lambda staining was apparently present in lymphoid cells.   Lymph node excisional biopsy on 02/06/2016 revealed mature B-cell lymphoma with plasmacytic differentiation, best classified as a marginal zone lymphoma.  PET scan on 01/31/2016 revealed active lymphoma within the chest, abdomen, and pelvis.  The largest lymph node was 1.7 cm.  Hepatitis B core antibody IgM, hepatitis B surface antigen, and hepatitis C antibody were negative on 01/21/2016.  Hepatitis B core antibody was negative on 02/22/2016.  G6PD assay was negative on 02/22/2016.  She developed acute renal insufficiency after initiation of Septra.  Renal ultrasound on 03/04/2016 revealed no evidence of hydronephrosis.  Baseline creatinine is 0.91 - 1.13 (CrCl 44 - 54 ml/min).  Creatinine was 1.54 (CrCl 29 ml/min) on 03/04/2016 and improved to baseline off Septra.  She received weekly Rituxan x 4 (03/11/2016 - 04/01/2016).  She tolerated her infusions well.  PET scan on 05/01/2016 revealed significant partial metabolic response. There was residual mildly hypermetabolic left axillary and mediastinal lymphoma, decreased in size and metabolism. Additional previously described sites of hypermetabolic lymphoma had resolved.  PET scan on 02/20/2017 revealed no evidence for residual hypermetabolic disease in the neck, chest, abdomen, or pelvis.  She is s/p 5 cycles maintenance Rituxan (06/03/2016 - 05/19/2017).  Symptomatically,  she denies any B symptoms.  Exam reveals no adenopathy or hepatosplenomegaly.  Uric acid is 3.5 (normal).  Plan: 1.  Labs today:  CBC with diff, CMP, LDH, uric acid. 2.  Cycle #6 maintenance Rituxan today. 3.  Discuss plans to continue maintenance Rituxan every 3 months x 2 years. 4.  RTC in 12 weeks for MD assessment, labs  (CBC with diff,  CMP, LDH, uric acid), and cycle #6 maintenance Rituxan.   Honor Loh, NP  08/17/17, 12:15 AM   I saw and evaluated the patient, participating in the key portions of the service and reviewing pertinent diagnostic studies and records.  I reviewed the nurse practitioner's note and agree with the findings and the plan.  The assessment and plan were discussed with the patient.  Additional diagnostic studies of *** are needed to clarify *** and would change the clinical management.  A few ***multiple questions were asked by the patient and answered.   Nolon Stalls, MD 08/17/2017,4:21 AM

## 2017-08-19 ENCOUNTER — Telehealth: Payer: Self-pay | Admitting: *Deleted

## 2017-08-19 NOTE — Telephone Encounter (Signed)
lab/MD (Rixuxan next day due to Availability in Infusion) per [facility] 08/19/17 staff mess and Verbal.  Called Patient.... appt for lab/MD and Rixuxan was Rescheduled  Patient is aware of date and time of her appt. Letter was also mailed out.

## 2017-08-27 ENCOUNTER — Telehealth: Payer: Self-pay | Admitting: *Deleted

## 2017-08-27 ENCOUNTER — Encounter: Payer: Self-pay | Admitting: Hematology and Oncology

## 2017-08-27 ENCOUNTER — Other Ambulatory Visit: Payer: Self-pay

## 2017-08-27 ENCOUNTER — Inpatient Hospital Stay: Payer: PPO | Attending: Hematology and Oncology

## 2017-08-27 ENCOUNTER — Inpatient Hospital Stay (HOSPITAL_BASED_OUTPATIENT_CLINIC_OR_DEPARTMENT_OTHER): Payer: PPO | Admitting: Hematology and Oncology

## 2017-08-27 VITALS — BP 161/71 | HR 60 | Temp 98.0°F | Resp 20 | Ht <= 58 in | Wt 101.1 lb

## 2017-08-27 DIAGNOSIS — Z5112 Encounter for antineoplastic immunotherapy: Secondary | ICD-10-CM

## 2017-08-27 DIAGNOSIS — M810 Age-related osteoporosis without current pathological fracture: Secondary | ICD-10-CM | POA: Insufficient documentation

## 2017-08-27 DIAGNOSIS — C8580 Other specified types of non-Hodgkin lymphoma, unspecified site: Secondary | ICD-10-CM

## 2017-08-27 DIAGNOSIS — M549 Dorsalgia, unspecified: Secondary | ICD-10-CM

## 2017-08-27 DIAGNOSIS — I4891 Unspecified atrial fibrillation: Secondary | ICD-10-CM | POA: Insufficient documentation

## 2017-08-27 DIAGNOSIS — Z87891 Personal history of nicotine dependence: Secondary | ICD-10-CM | POA: Insufficient documentation

## 2017-08-27 DIAGNOSIS — C8590 Non-Hodgkin lymphoma, unspecified, unspecified site: Secondary | ICD-10-CM

## 2017-08-27 DIAGNOSIS — I10 Essential (primary) hypertension: Secondary | ICD-10-CM | POA: Diagnosis not present

## 2017-08-27 LAB — CBC WITH DIFFERENTIAL/PLATELET
Basophils Absolute: 0 10*3/uL (ref 0–0.1)
Basophils Relative: 1 %
Eosinophils Absolute: 0.2 10*3/uL (ref 0–0.7)
Eosinophils Relative: 4 %
HCT: 41.1 % (ref 35.0–47.0)
Hemoglobin: 14 g/dL (ref 12.0–16.0)
Lymphocytes Relative: 26 %
Lymphs Abs: 1 10*3/uL (ref 1.0–3.6)
MCH: 32.9 pg (ref 26.0–34.0)
MCHC: 34 g/dL (ref 32.0–36.0)
MCV: 96.8 fL (ref 80.0–100.0)
Monocytes Absolute: 1.1 10*3/uL — ABNORMAL HIGH (ref 0.2–0.9)
Monocytes Relative: 27 %
Neutro Abs: 1.7 10*3/uL (ref 1.4–6.5)
Neutrophils Relative %: 42 %
Platelets: 257 10*3/uL (ref 150–440)
RBC: 4.25 MIL/uL (ref 3.80–5.20)
RDW: 13.3 % (ref 11.5–14.5)
WBC: 4 10*3/uL (ref 3.6–11.0)

## 2017-08-27 LAB — COMPREHENSIVE METABOLIC PANEL
ALT: 15 U/L (ref 14–54)
AST: 26 U/L (ref 15–41)
Albumin: 4.5 g/dL (ref 3.5–5.0)
Alkaline Phosphatase: 121 U/L (ref 38–126)
Anion gap: 11 (ref 5–15)
BUN: 27 mg/dL — ABNORMAL HIGH (ref 6–20)
CO2: 26 mmol/L (ref 22–32)
Calcium: 9.5 mg/dL (ref 8.9–10.3)
Chloride: 103 mmol/L (ref 101–111)
Creatinine, Ser: 1.02 mg/dL — ABNORMAL HIGH (ref 0.44–1.00)
GFR calc Af Amer: 55 mL/min — ABNORMAL LOW (ref >60)
GFR calc non Af Amer: 47 mL/min — ABNORMAL LOW (ref >60)
Glucose, Bld: 124 mg/dL — ABNORMAL HIGH (ref 65–99)
Potassium: 4.2 mmol/L (ref 3.5–5.1)
Sodium: 140 mmol/L (ref 135–145)
Total Bilirubin: 0.6 mg/dL (ref 0.3–1.2)
Total Protein: 7.7 g/dL (ref 6.5–8.1)

## 2017-08-27 LAB — LACTATE DEHYDROGENASE: LDH: 210 U/L — ABNORMAL HIGH (ref 98–192)

## 2017-08-27 LAB — URIC ACID: Uric Acid, Serum: 6.8 mg/dL — ABNORMAL HIGH (ref 2.3–6.6)

## 2017-08-27 NOTE — Telephone Encounter (Signed)
-----   Message from Lequita Asal, MD sent at 08/27/2017  4:32 PM EST ----- Regarding: Please call patient  Is she taking her allopurinol?  M  ----- Message ----- From: Interface, Lab In [place] Sent: 08/27/2017   2:56 PM To: Lequita Asal, MD

## 2017-08-27 NOTE — Progress Notes (Signed)
[facility] day:  08/27/2017   Chief Complaint: Joan Hull is a 84 y.o. 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  HPI:  The patient was last seen in the medical oncology clinic on 05/19/2017.  At that time, patient was doing well. She complained of some minor shortness of breath to which she attributed to a "leakin heart valve". Patient denied B symptoms or recent infections. She was no longer taking her Allopurinol. Exam was stable. Labs were unremarkable. She received cycle #5 maintenance Rituxan.  During the interim, she denies any B symptoms.  She denies any bruising or bleeding.  She denies any adenopathy.  She denies any fevers or infections.  She notes having back trouble for the 4th time.  She notes that she has lost height (5'1" to 4'10").  She notes a curvature to her spine.  She feels intermittent sharp pains in her back.   Past Medical History:  Diagnosis Date  . Anxiety   . Arthritis   . Atrial fibrillation ([place])   . CAD (coronary artery disease)   . Cancer [facility]) 2017   lymphoma  . Depression   . Dysrhythmia   . GERD (gastroesophageal reflux disease)   . Heart murmur   . History of immunotherapy 02/20/2017  . Hx of colonic polyps   . Hyperlipidemia   . Hypertension   . Interstitial cystitis   . Non Q wave myocardial infarction ([place]) 06/27/11   [facility]  . Osteoporosis   . Presence of permanent cardiac pacemaker   . Shortness of breath dyspnea    doe    Past Surgical History:  Procedure Laterality Date  . AXILLARY LYMPH NODE BIOPSY Left 02/06/2016   Procedure: AXILLARY LYMPH NODE BIOPSY;  Surgeon: [name], MD;  Location: [facility] ORS;  Service: General;  Laterality: Left;  . CARDIAC CATHETERIZATION    . CATARACT EXTRACTION Right 2005  . COLONOSCOPY  2007   [name]  . CORONARY ANGIOPLASTY    . CORONARY ARTERY BYPASS  GRAFT  1/13   [facility]  . CORONARY STENT PLACEMENT  7/12   RCA with drug eluting stent---[name]  . EYE SURGERY    . INSERT / REPLACE / REMOVE PACEMAKER    . PACEMAKER INSERTION  5/13   [facility]    Family History  Problem Relation Age of Onset  . Heart disease Mother        cad  . Hypertension Mother   . Heart disease Sister        half sister MI  . Cancer Sister        BRAIN  . Heart disease Father        heart attack  . Heart disease Brother        cabg  . Heart disease Brother   . Cancer Brother        BLADDER  . Cancer Sister 65       OVARIAN    Social History:  reports that she quit smoking about 55 years ago. she has never used smokeless tobacco. She reports that she does not drink alcohol or use drugs.  She smoked when she was young (quit in 1964).  She lives alone in [place].  Her son lives in [place], [place]. The patient is alone today.  Allergies:  Allergies  Allergen Reactions  . Aspirin Other (See  Comments)    Bladder spasm  . Diazepam     REACTION: worsened bladder problems  . Hydrochlorothiazide Other (See Comments)  . Chlorhexidine Rash    Skin reacts only to scrub    Current Medications: Current Outpatient Medications  Medication Sig Dispense Refill  . acyclovir (ZOVIRAX) 400 MG tablet Take 400 mg by mouth continuous as needed.    Marland Kitchen atenolol (TENORMIN) 25 MG tablet Take 25 mg 2 (two) times daily by mouth. 50--AM 25--PM    . cholecalciferol (VITAMIN D) 1000 units tablet Take 1,000 Units by mouth daily.    Marland Kitchen diltiazem (CARDIZEM CD) 180 MG 24 hr capsule Take 180 mg by mouth daily.   5  . ELIQUIS 2.5 MG TABS tablet Take 2.5 mg by mouth 2 (two) times daily.     . Multiple Vitamins-Minerals (CENTRUM SILVER 50+WOMEN PO) Take 1 tablet by mouth.    Marland Kitchen PARoxetine (PAXIL) 10 MG tablet TAKE ONE TABLET BY MOUTH ONCE DAILY 90 tablet 3  . Probiotic Product (PROBIOTIC DAILY PO) Take 1 capsule by mouth daily.     Marland Kitchen torsemide (DEMADEX) 20 MG tablet TAKE  ONE-HALF TABLET BY MOUTH ONCE DAILY 45 tablet 0  . vitamin B-12 (CYANOCOBALAMIN) 100 MCG tablet Take 100 mcg by mouth daily.    Marland Kitchen zolpidem (AMBIEN) 5 MG tablet Take 1 tablet (5 mg total) by mouth at bedtime as needed. 90 tablet 0   No current facility-administered medications for this visit.     Review of Systems:  GENERAL:  Feels good.  No fevers or sweats.  Weight down 1 pound. PERFORMANCE STATUS (ECOG):  1 HEENT:  No visual changes, runny nose, sore throat, mouth sores or tenderness. Lungs: Shortness of breath with exertion (see HPI).  Lingering cough.  No hemoptysis. Cardiac:  Atrial fibrillation.  No chest pain, palpitations, orthopnea, or PND. GI:  Appetite good.  No nausea, vomiting, diarrhea, constipation, melena or hematochezia. GU:  No urgency, frequency, dysuria, or hematuria. Musculoskeletal:  No back pain.  No joint pain.  No muscle tenderness. Extremities:  Arthritis.  No lower extremity pain or swelling. Skin: Bruising, itching, and tender shins.  No rashes or skin changes. Neuro:  No headache, numbness or weakness, balance or coordination issues. Endocrine:  No diabetes, thyroid issues, hot flashes or night sweats. Psych:  No mood changes, depression or anxiety. Pain:  No focal pain. Review of systems:  All other systems reviewed and found to be negative.  Physical Exam: Blood pressure (!) 161/71, pulse 60, temperature 98 F (36.7 C), temperature source Tympanic, resp. rate 20, height '4\' 9"'$  (1.448 m), weight 101 lb 1.6 oz (45.9 kg). GENERAL:  Well developed, well nourished, woman sitting comfortably in the exam room in no acute distress. MENTAL STATUS:  Alert and oriented to person, place and time. HEAD:  Dark brown wavy hair.  Normocephalic, atraumatic, face symmetric, no Cushingoid features. EYES:  Glasses.  Blue eyes s/p cataract surgery on the right.  Pupils equal round and reactive to light and accomodation.  No conjunctivitis or scleral icterus. ENT:  Oropharynx  clear without lesion.  Tongue normal. Mucous membranes moist.  RESPIRATORY:  Clear to auscultation without rales, wheezes or rhonchi. CARDIOVASCULAR:  Irregular rhythm without murmur, rub or gallop. CHEST:  Well healed midline sternotomy incision.  Pacemaker. ABDOMEN:  Soft, non-tender, with active bowel sounds, and no hepatosplenomegaly.  No masses. SKIN:  Chronic lower extremity pigment changes (brown).  No vesicles.  No rashes, ulcers or lesions. EXTREMITIES: Arthritis changes  in hands.  Left ganglion cyst.  No edema, no skin discoloration or tenderness.  No palpable cords. LYMPH NODES: No palpable cervical, supraclavicular, axillary or inguinal adenopathy  NEUROLOGICAL: Unremarkable. PSYCH:  Appropriate.   Appointment on 08/27/2017  Component Date Value Ref Range Status  . Uric Acid, Serum 08/27/2017 6.8* 2.3 - 6.6 mg/dL Final   Performed at [facility], [address] [place], [place] [address]  . LDH 08/27/2017 210* 98 - 192 U/L Final   Performed at [facility], [address] [place], [place] [address]  . Sodium 08/27/2017 140  135 - 145 mmol/L Final  . Potassium 08/27/2017 4.2  3.5 - 5.1 mmol/L Final  . Chloride 08/27/2017 103  101 - 111 mmol/L Final  . CO2 08/27/2017 26  22 - 32 mmol/L Final  . Glucose, Bld 08/27/2017 124* 65 - 99 mg/dL Final  . BUN 08/27/2017 27* 6 - 20 mg/dL Final  . Creatinine, Ser 08/27/2017 1.02* 0.44 - 1.00 mg/dL Final  . Calcium 08/27/2017 9.5  8.9 - 10.3 mg/dL Final  . Total Protein 08/27/2017 7.7  6.5 - 8.1 g/dL Final  . Albumin 08/27/2017 4.5  3.5 - 5.0 g/dL Final  . AST 08/27/2017 26  15 - 41 U/L Final  . ALT 08/27/2017 15  14 - 54 U/L Final  . Alkaline Phosphatase 08/27/2017 121  38 - 126 U/L Final  . Total Bilirubin 08/27/2017 0.6  0.3 - 1.2 mg/dL Final  . GFR calc non Af Amer 08/27/2017 47* >60 mL/min Final  . GFR calc Af Amer 08/27/2017 55* >60 mL/min Final   Comment: (NOTE) The eGFR has been calculated using the CKD EPI  equation. This calculation has not been validated in all clinical situations. eGFR's persistently <60 mL/min signify possible Chronic Kidney Disease.   Georgiann Hahn gap 08/27/2017 11  5 - 15 Final   Performed at [facility], [address] [place], [place] [address]  . WBC 08/27/2017 4.0  3.6 - 11.0 K/uL Final  . RBC 08/27/2017 4.25  3.80 - 5.20 MIL/uL Final  . Hemoglobin 08/27/2017 14.0  12.0 - 16.0 g/dL Final  . HCT 08/27/2017 41.1  35.0 - 47.0 % Final  . MCV 08/27/2017 96.8  80.0 - 100.0 fL Final  . MCH 08/27/2017 32.9  26.0 - 34.0 pg Final  . MCHC 08/27/2017 34.0  32.0 - 36.0 g/dL Final  . RDW 08/27/2017 13.3  11.5 - 14.5 % Final  . Platelets 08/27/2017 257  150 - 440 K/uL Final  . Neutrophils Relative % 08/27/2017 42  % Final  . Neutro Abs 08/27/2017 1.7  1.4 - 6.5 K/uL Final  . Lymphocytes Relative 08/27/2017 26  % Final  . Lymphs Abs 08/27/2017 1.0  1.0 - 3.6 K/uL Final  . Monocytes Relative 08/27/2017 27  % Final  . Monocytes Absolute 08/27/2017 1.1* 0.2 - 0.9 K/uL Final  . Eosinophils Relative 08/27/2017 4  % Final  . Eosinophils Absolute 08/27/2017 0.2  0 - 0.7 K/uL Final  . Basophils Relative 08/27/2017 1  % Final  . Basophils Absolute 08/27/2017 0.0  0 - 0.1 K/uL Final   Performed at [facility], [address] [place],  [address]    Assessment:  CAMAY PEDIGO is a 84 y.o. female with a stage III marginal zone lymphoma.  She presented with a left axillary mass.  Biopsy on 01/15/2016 revealed an atypical lymphoid proliferation highly suspicious for non-Hodgkin's lymphoma.  There were diffuse sheets  of B-cells (CD20 and CD79a +; CD5 and CD10-).  In addition, there was CD138 + abundant plasmacytoid cells with lambda light chain restriction. The lambda staining was apparently present in lymphoid cells.   Lymph node excisional biopsy on 02/06/2016 revealed mature B-cell lymphoma with plasmacytic differentiation, best classified as a marginal zone  lymphoma.  PET scan on 01/31/2016 revealed active lymphoma within the chest, abdomen, and pelvis.  The largest lymph node was 1.7 cm.  Hepatitis B core antibody IgM, hepatitis B surface antigen, and hepatitis C antibody were negative on 01/21/2016.  Hepatitis B core antibody was negative on 02/22/2016.  G6PD assay was negative on 02/22/2016.  She developed acute renal insufficiency after initiation of Septra.  Renal ultrasound on 03/04/2016 revealed no evidence of hydronephrosis.  Baseline creatinine is 0.91 - 1.13 (CrCl 44 - 54 ml/min).  Creatinine was 1.54 (CrCl 29 ml/min) on 03/04/2016 and improved to baseline off Septra.  She received weekly Rituxan x 4 (03/11/2016 - 04/01/2016).  She tolerated her infusions well.  PET scan on 05/01/2016 revealed significant partial metabolic response. There was residual mildly hypermetabolic left axillary and mediastinal lymphoma, decreased in size and metabolism. Additional previously described sites of hypermetabolic lymphoma had resolved.  PET scan on 02/20/2017 revealed no evidence for residual hypermetabolic disease in the neck, chest, abdomen, or pelvis.  She is s/p 5 cycles maintenance Rituxan (06/03/2016 - 05/19/2017).  Symptomatically, she denies any B symptoms.  She has back pain.  Exam reveals no adenopathy or hepatosplenomegaly.  Uric acid is 6.8.  Plan: 1.  Labs today:  CBC with diff, CMP, LDH, uric acid. 2.  Cycle #6 maintenance Rituxan tomorrow. 3.  Review plans to continue maintenance Rituxan every 3 months x 2 years. 4.  Discuss follow-up with Dr. [name] regarding back pain and loss of height (? compression fracture). 5.  RTC in 12 weeks for MD assessment, labs (CBC with diff, CMP, LDH, uric acid), and cycle #7 maintenance Rituxan.   Lequita Asal, MD  08/27/2017, 4:35 PM

## 2017-08-27 NOTE — Telephone Encounter (Signed)
Attempted to call patient. No answer.  Will try again tomorrow.

## 2017-08-28 ENCOUNTER — Inpatient Hospital Stay: Payer: PPO

## 2017-08-28 ENCOUNTER — Telehealth: Payer: Self-pay | Admitting: *Deleted

## 2017-08-28 ENCOUNTER — Other Ambulatory Visit: Payer: Self-pay | Admitting: Hematology and Oncology

## 2017-08-28 DIAGNOSIS — Z5112 Encounter for antineoplastic immunotherapy: Secondary | ICD-10-CM | POA: Diagnosis not present

## 2017-08-28 DIAGNOSIS — C8580 Other specified types of non-Hodgkin lymphoma, unspecified site: Secondary | ICD-10-CM

## 2017-08-28 MED ORDER — SODIUM CHLORIDE 0.9 % IV SOLN: 375.0000 mg/m2 | Freq: Once | INTRAVENOUS | Status: DC

## 2017-08-28 MED ORDER — SODIUM CHLORIDE 0.9 % IV SOLN
375.0000 mg/m2 | Freq: Once | INTRAVENOUS | Status: AC
  Administered 2017-08-28: 500 mg via INTRAVENOUS
  Filled 2017-08-28: qty 50

## 2017-08-28 MED ORDER — ACETAMINOPHEN 325 MG PO TABS
650.0000 mg | ORAL_TABLET | Freq: Once | ORAL | Status: AC
  Administered 2017-08-28: 650 mg via ORAL
  Filled 2017-08-28: qty 2

## 2017-08-28 MED ORDER — SODIUM CHLORIDE 0.9 % IV SOLN
Freq: Once | INTRAVENOUS | Status: AC
  Administered 2017-08-28: 10:00:00 via INTRAVENOUS
  Filled 2017-08-28: qty 1000

## 2017-08-28 MED ORDER — DIPHENHYDRAMINE HCL 25 MG PO CAPS
25.0000 mg | ORAL_CAPSULE | Freq: Once | ORAL | Status: AC
  Administered 2017-08-28: 25 mg via ORAL
  Filled 2017-08-28: qty 1

## 2017-08-28 NOTE — Telephone Encounter (Signed)
-----   Message from Lequita Asal, MD sent at 08/27/2017  4:32 PM EST ----- Regarding: Please call patient  Is she taking her allopurinol?  M  ----- Message ----- From: Interface, Lab In [place] Sent: 08/27/2017   2:56 PM To: Lequita Asal, MD

## 2017-08-28 NOTE — Telephone Encounter (Signed)
Attempted to call patient again today.  No answer and no availability to leave message.

## 2017-08-30 ENCOUNTER — Encounter: Payer: Self-pay | Admitting: Hematology and Oncology

## 2017-08-31 ENCOUNTER — Telehealth: Payer: Self-pay | Admitting: Internal Medicine

## 2017-08-31 NOTE — Telephone Encounter (Signed)
Returned patient's call and she reports:  Sharp shooting pains under right arm now moving around to shoulder and back X 2 weeks.  While speaking with Dr. Mike Gip last week she recommended that patient see her PCP to examine further.  She is better this week and has been taking Tylenol as well as using a pain patch but is concerned.  Also, she would like to speak with Dr. [name] about her dx of osteoporosis and discuss medication options if appropriate.  Patient is also thinking she is due for a lipid panel but will review all questions at her office visit on 09/08/17 at 11:00am.  Patient is to call back if symptoms worsen or has any further concerns in the meantime.

## 2017-08-31 NOTE — Telephone Encounter (Signed)
Okay These issues can wait till the appt next week

## 2017-08-31 NOTE — Telephone Encounter (Signed)
Copied from [place] [id number]. Topic: Quick Communication - See Telephone Encounter >> Aug 31, 2017 12:40 PM Antonieta Iba C wrote: CRM for notification. See Telephone encounter for: pt is calling in to be advised. Pt says that she's been having some back pain and would like to know if she should see a Chiropractor ? Also, Pt has a few questions, she would like to know her current A1C?    Pt would like a call back: [phone].    08/31/17.

## 2017-09-07 ENCOUNTER — Other Ambulatory Visit: Payer: Self-pay | Admitting: Internal Medicine

## 2017-09-07 NOTE — Telephone Encounter (Signed)
Last filled 06-11-17 #90 Last OV 06-26-17 Next OV 09-08-17

## 2017-09-08 ENCOUNTER — Ambulatory Visit (INDEPENDENT_AMBULATORY_CARE_PROVIDER_SITE_OTHER): Payer: PPO | Admitting: Internal Medicine

## 2017-09-08 ENCOUNTER — Encounter: Payer: Self-pay | Admitting: Internal Medicine

## 2017-09-08 VITALS — BP 146/76 | HR 80 | Temp 97.5°F | Wt 104.8 lb

## 2017-09-08 DIAGNOSIS — C8580 Other specified types of non-Hodgkin lymphoma, unspecified site: Secondary | ICD-10-CM

## 2017-09-08 DIAGNOSIS — J439 Emphysema, unspecified: Secondary | ICD-10-CM

## 2017-09-08 DIAGNOSIS — M810 Age-related osteoporosis without current pathological fracture: Secondary | ICD-10-CM | POA: Diagnosis not present

## 2017-09-08 DIAGNOSIS — I480 Paroxysmal atrial fibrillation: Secondary | ICD-10-CM | POA: Diagnosis not present

## 2017-09-08 DIAGNOSIS — M549 Dorsalgia, unspecified: Secondary | ICD-10-CM | POA: Insufficient documentation

## 2017-09-08 DIAGNOSIS — F39 Unspecified mood [affective] disorder: Secondary | ICD-10-CM | POA: Diagnosis not present

## 2017-09-08 DIAGNOSIS — I25119 Atherosclerotic heart disease of native coronary artery with unspecified angina pectoris: Secondary | ICD-10-CM | POA: Diagnosis not present

## 2017-09-08 NOTE — Patient Instructions (Signed)
Trying adding 1-2 tums daily for calcium

## 2017-09-08 NOTE — Assessment & Plan Note (Signed)
Chronic anxiety Just uses the zolpidem to be able to sleep

## 2017-09-08 NOTE — Assessment & Plan Note (Signed)
Limited with exertion Does have cough at times Not clear how much this is causing her symptoms

## 2017-09-08 NOTE — Progress Notes (Signed)
Subjective:    Patient ID: Joan Hull, female    DOB: May 29, 1926, 84 y.o.   MRN: [id number]  HPI Here due to back pain Started in right ribs Then sharp pain in right scapula area Had some chest pains---but nitro did take it away Easy DOE still --like carrying groceries  Sometimes left flank pain as well Right leg has hurt---OTC pain patch did help  Also concerned about her bones Ongoing kyphosis and past fracture Interested in Rx for her osteoporosis  Sees Joan Hull regularly Still on eliquis Feels heart "runnig away at times" --over 100. Extra atenolol will help  Still has the anxiety Zolpidem helps her sleep   Current Outpatient Medications on File Prior to Visit  Medication Sig Dispense Refill  . acyclovir (ZOVIRAX) 400 MG tablet Take 400 mg by mouth continuous as needed.    Marland Kitchen atenolol (TENORMIN) 25 MG tablet Take 25 mg 2 (two) times daily by mouth. 50--AM 25--PM    . cholecalciferol (VITAMIN D) 1000 units tablet Take 1,000 Units by mouth daily.    Marland Kitchen diltiazem (CARDIZEM CD) 180 MG 24 hr capsule Take 180 mg by mouth daily.   5  . ELIQUIS 2.5 MG TABS tablet Take 2.5 mg by mouth 2 (two) times daily.     . Multiple Vitamins-Minerals (CENTRUM SILVER 50+WOMEN PO) Take 1 tablet by mouth.    Marland Kitchen PARoxetine (PAXIL) 10 MG tablet TAKE ONE TABLET BY MOUTH ONCE DAILY 90 tablet 3  . Probiotic Product (PROBIOTIC DAILY PO) Take 1 capsule by mouth daily.     Marland Kitchen torsemide (DEMADEX) 20 MG tablet TAKE ONE-HALF TABLET BY MOUTH ONCE DAILY 45 tablet 0  . vitamin B-12 (CYANOCOBALAMIN) 100 MCG tablet Take 100 mcg by mouth daily.    Marland Kitchen zolpidem (AMBIEN) 5 MG tablet TAKE 1 TABLET BY MOUTH AT BEDTIME AS NEEDED 90 tablet 0   No current facility-administered medications on file prior to visit.     Allergies  Allergen Reactions  . Aspirin Other (See Comments)    Bladder spasm  . Diazepam     REACTION: worsened bladder problems  . Hydrochlorothiazide Other (See Comments)  . Chlorhexidine  Rash    Skin reacts only to scrub    Past Medical History:  Diagnosis Date  . Anxiety   . Arthritis   . Atrial fibrillation ([place])   . CAD (coronary artery disease)   . Cancer [facility]) 2017   lymphoma  . Depression   . Dysrhythmia   . GERD (gastroesophageal reflux disease)   . Heart murmur   . History of immunotherapy 02/20/2017  . Hx of colonic polyps   . Hyperlipidemia   . Hypertension   . Interstitial cystitis   . Non Q wave myocardial infarction ([place]) 06/27/11   [facility]  . Osteoporosis   . Presence of permanent cardiac pacemaker   . Shortness of breath dyspnea    doe    Past Surgical History:  Procedure Laterality Date  . AXILLARY LYMPH NODE BIOPSY Left 02/06/2016   Procedure: AXILLARY LYMPH NODE BIOPSY;  Surgeon: Joan Bellow, MD;  Location: [facility] ORS;  Service: General;  Laterality: Left;  . CARDIAC CATHETERIZATION    . CATARACT EXTRACTION Right 2005  . COLONOSCOPY  2007   Joan Joan Hull  . CORONARY ANGIOPLASTY    . CORONARY ARTERY BYPASS GRAFT  1/13   [facility]  . CORONARY STENT PLACEMENT  7/12   RCA with drug eluting stent---Joan Hull  . EYE SURGERY    .  INSERT / REPLACE / REMOVE PACEMAKER    . PACEMAKER INSERTION  5/13   [facility]    Family History  Problem Relation Age of Onset  . Heart disease Mother        cad  . Hypertension Mother   . Heart disease Sister        half sister MI  . Cancer Sister        BRAIN  . Heart disease Father        heart attack  . Heart disease Brother        cabg  . Heart disease Brother   . Cancer Brother        BLADDER  . Cancer Sister 67       OVARIAN    Social History   Socioeconomic History  . Marital status: Widowed    Spouse name: Not on file  . Number of children: 1  . Years of education: Not on file  . Highest education level: Not on file  Social Needs  . Financial resource strain: Not on file  . Food insecurity - worry: Not on file  . Food insecurity - inability: Not on file  . Transportation needs -  medical: Not on file  . Transportation needs - non-medical: Not on file  Occupational History  . 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 Smoking status: Former Smoker    Last attempt to quit: 08/18/1962    Years since quitting: 55.0  . Smokeless tobacco: Never Used  Substance and Sexual Activity  . Alcohol use: No    Alcohol/week: 0.0 oz  . Drug use: No  . Sexual activity: Not on file  Other Topics Concern  . Not on file  Social History Narrative   Now has living will   Son Joan Hull is her health care POA. Alternate is sister Joan Hull.   Would accept resuscitation attempts--but no prolonged ventilation   No tube feeds if cognitively unaware   Review of Systems Doing fairly well with rituxan for lymphoma Appetite is good    Objective:   Physical Exam  Constitutional: No distress.  Cardiovascular: Normal rate, regular rhythm and normal heart sounds. Exam reveals no gallop.  No murmur heard. Pulmonary/Chest: Effort normal and breath sounds normal. No respiratory distress. She has no wheezes. She has no rales.  Musculoskeletal:  No back tenderness Mild kyphosis  Skin: No rash noted.          Assessment & Plan:

## 2017-09-08 NOTE — Assessment & Plan Note (Signed)
Finishing up rituxan Rx PET scan shows response

## 2017-09-08 NOTE — Assessment & Plan Note (Addendum)
Discussed trying bisphosphonate--she is very high risk Add tums Concern due to her age and GFR so will hold off

## 2017-09-08 NOTE — Assessment & Plan Note (Signed)
Discussed that this is likely causing some of the chest pain and DOE Nitro helps Discussed 911 if not effective

## 2017-09-08 NOTE — Assessment & Plan Note (Signed)
Has been in various places and come and go May be reaction to the rituxan No clear cut pain now

## 2017-09-08 NOTE — Assessment & Plan Note (Signed)
Continues with Dr Ubaldo Glassing On the eliquis

## 2017-09-16 DIAGNOSIS — I495 Sick sinus syndrome: Secondary | ICD-10-CM | POA: Diagnosis not present

## 2017-09-16 DIAGNOSIS — I4891 Unspecified atrial fibrillation: Secondary | ICD-10-CM | POA: Diagnosis not present

## 2017-09-16 DIAGNOSIS — I10 Essential (primary) hypertension: Secondary | ICD-10-CM | POA: Diagnosis not present

## 2017-09-16 DIAGNOSIS — I25810 Atherosclerosis of coronary artery bypass graft(s) without angina pectoris: Secondary | ICD-10-CM | POA: Diagnosis not present

## 2017-09-16 DIAGNOSIS — I209 Angina pectoris, unspecified: Secondary | ICD-10-CM | POA: Diagnosis not present

## 2017-09-16 DIAGNOSIS — R0602 Shortness of breath: Secondary | ICD-10-CM | POA: Diagnosis not present

## 2017-09-25 DIAGNOSIS — R0602 Shortness of breath: Secondary | ICD-10-CM | POA: Diagnosis not present

## 2017-10-03 ENCOUNTER — Other Ambulatory Visit: Payer: Self-pay | Admitting: Internal Medicine

## 2017-10-09 DIAGNOSIS — H2512 Age-related nuclear cataract, left eye: Secondary | ICD-10-CM | POA: Diagnosis not present

## 2017-11-19 ENCOUNTER — Inpatient Hospital Stay: Payer: PPO | Attending: Hematology and Oncology

## 2017-11-19 ENCOUNTER — Inpatient Hospital Stay: Payer: PPO

## 2017-11-19 ENCOUNTER — Inpatient Hospital Stay (HOSPITAL_BASED_OUTPATIENT_CLINIC_OR_DEPARTMENT_OTHER): Payer: PPO | Admitting: Hematology and Oncology

## 2017-11-19 ENCOUNTER — Other Ambulatory Visit: Payer: Self-pay

## 2017-11-19 ENCOUNTER — Encounter: Payer: Self-pay | Admitting: Hematology and Oncology

## 2017-11-19 VITALS — BP 128/75 | HR 60 | Temp 96.2°F | Resp 18 | Wt 103.8 lb

## 2017-11-19 DIAGNOSIS — I10 Essential (primary) hypertension: Secondary | ICD-10-CM

## 2017-11-19 DIAGNOSIS — Z87891 Personal history of nicotine dependence: Secondary | ICD-10-CM | POA: Diagnosis not present

## 2017-11-19 DIAGNOSIS — C884 Extranodal marginal zone B-cell lymphoma of mucosa-associated lymphoid tissue [MALT-lymphoma]: Secondary | ICD-10-CM

## 2017-11-19 DIAGNOSIS — C8580 Other specified types of non-Hodgkin lymphoma, unspecified site: Secondary | ICD-10-CM

## 2017-11-19 DIAGNOSIS — Z5112 Encounter for antineoplastic immunotherapy: Secondary | ICD-10-CM | POA: Insufficient documentation

## 2017-11-19 DIAGNOSIS — C8590 Non-Hodgkin lymphoma, unspecified, unspecified site: Secondary | ICD-10-CM

## 2017-11-19 LAB — LACTATE DEHYDROGENASE: LDH: 149 U/L (ref 98–192)

## 2017-11-19 LAB — CBC WITH DIFFERENTIAL/PLATELET
Basophils Absolute: 0 10*3/uL (ref 0–0.1)
Basophils Relative: 1 %
Eosinophils Absolute: 0.1 10*3/uL (ref 0–0.7)
Eosinophils Relative: 3 %
HCT: 40.9 % (ref 35.0–47.0)
Hemoglobin: 14.2 g/dL (ref 12.0–16.0)
Lymphocytes Relative: 24 %
Lymphs Abs: 1 10*3/uL (ref 1.0–3.6)
MCH: 33 pg (ref 26.0–34.0)
MCHC: 34.8 g/dL (ref 32.0–36.0)
MCV: 94.9 fL (ref 80.0–100.0)
Monocytes Absolute: 1.3 10*3/uL — ABNORMAL HIGH (ref 0.2–0.9)
Monocytes Relative: 30 %
Neutro Abs: 1.8 10*3/uL (ref 1.4–6.5)
Neutrophils Relative %: 42 %
Platelets: 234 10*3/uL (ref 150–440)
RBC: 4.3 MIL/uL (ref 3.80–5.20)
RDW: 13.9 % (ref 11.5–14.5)
WBC: 4.3 10*3/uL (ref 3.6–11.0)

## 2017-11-19 LAB — COMPREHENSIVE METABOLIC PANEL
ALT: 17 U/L (ref 14–54)
AST: 26 U/L (ref 15–41)
Albumin: 4.2 g/dL (ref 3.5–5.0)
Alkaline Phosphatase: 103 U/L (ref 38–126)
Anion gap: 9 (ref 5–15)
BUN: 25 mg/dL — ABNORMAL HIGH (ref 6–20)
CO2: 23 mmol/L (ref 22–32)
Calcium: 9.6 mg/dL (ref 8.9–10.3)
Chloride: 104 mmol/L (ref 101–111)
Creatinine, Ser: 1.17 mg/dL — ABNORMAL HIGH (ref 0.44–1.00)
GFR calc Af Amer: 46 mL/min — ABNORMAL LOW (ref >60)
GFR calc non Af Amer: 40 mL/min — ABNORMAL LOW (ref >60)
Glucose, Bld: 137 mg/dL — ABNORMAL HIGH (ref 65–99)
Potassium: 4.7 mmol/L (ref 3.5–5.1)
Sodium: 136 mmol/L (ref 135–145)
Total Bilirubin: 0.8 mg/dL (ref 0.3–1.2)
Total Protein: 6.9 g/dL (ref 6.5–8.1)

## 2017-11-19 LAB — URIC ACID: Uric Acid, Serum: 7.3 mg/dL — ABNORMAL HIGH (ref 2.3–6.6)

## 2017-11-19 MED ORDER — SODIUM CHLORIDE 0.9 % IV SOLN
375.0000 mg/m2 | Freq: Once | INTRAVENOUS | Status: AC
  Administered 2017-11-19: 500 mg via INTRAVENOUS
  Filled 2017-11-19: qty 50

## 2017-11-19 MED ORDER — DIPHENHYDRAMINE HCL 25 MG PO CAPS
25.0000 mg | ORAL_CAPSULE | Freq: Once | ORAL | Status: AC
  Administered 2017-11-19: 25 mg via ORAL
  Filled 2017-11-19: qty 1

## 2017-11-19 MED ORDER — SODIUM CHLORIDE 0.9 % IV SOLN
Freq: Once | INTRAVENOUS | Status: AC
  Administered 2017-11-19: 11:00:00 via INTRAVENOUS
  Filled 2017-11-19: qty 1000

## 2017-11-19 MED ORDER — RITUXIMAB CHEMO INJECTION 500 MG/50ML: 375.0000 mg/m2 | Freq: Once | INTRAVENOUS | Status: DC

## 2017-11-19 MED ORDER — ACETAMINOPHEN 325 MG PO TABS
650.0000 mg | ORAL_TABLET | Freq: Once | ORAL | Status: AC
  Administered 2017-11-19: 650 mg via ORAL
  Filled 2017-11-19: qty 2

## 2017-11-19 NOTE — Progress Notes (Signed)
[facility] day:  11/19/2017    Chief Complaint: Joan Hull is a 84 y.o. 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  HPI:  The patient was last seen in the medical oncology clinic on 08/27/2017.  At that time, she denied any B symptoms.  She had back pain.  Exam revealed no adenopathy or hepatosplenomegaly.  Uric acid was 6.8.  She received cycle #6 maintenance Rituxan.  During the interim, patient is doing well. She denies any acute concerns today. Patient denies fevers or sweats. She continues to have some minimal exertional dyspnea. Patient has a cyst to her back that she feels is getting larger. Patient notes that she has a "kernel" in her LEFT groin. She denies pain in the clinic today.   Patient is scheduled for cataract surgery in her LEFT eye in a few weeks.   Patient eating well. Weight is down 1 pound.    Past Medical History:  Diagnosis Date  . Anxiety   . Arthritis   . Atrial fibrillation ([place])   . CAD (coronary artery disease)   . Cancer [facility]) 2017   lymphoma  . Depression   . Dysrhythmia   . GERD (gastroesophageal reflux disease)   . Heart murmur   . History of immunotherapy 02/20/2017  . Hx of colonic polyps   . Hyperlipidemia   . Hypertension   . Interstitial cystitis   . Non Q wave myocardial infarction ([place]) 06/27/11   [facility]  . Osteoporosis   . Presence of permanent cardiac pacemaker   . Shortness of breath dyspnea    doe    Past Surgical History:  Procedure Laterality Date  . AXILLARY LYMPH NODE BIOPSY Left 02/06/2016   Procedure: AXILLARY LYMPH NODE BIOPSY;  Surgeon: [name], MD;  Location: [facility] ORS;  Service: General;  Laterality: Left;  . CARDIAC CATHETERIZATION    . CATARACT EXTRACTION Right 2005  . COLONOSCOPY  2007   [name]  . CORONARY ANGIOPLASTY    . CORONARY ARTERY BYPASS GRAFT  1/13   [facility]   . CORONARY STENT PLACEMENT  7/12   RCA with drug eluting stent---[name]  . EYE SURGERY    . INSERT / REPLACE / REMOVE PACEMAKER    . PACEMAKER INSERTION  5/13   [facility]    Family History  Problem Relation Age of Onset  . Heart disease Mother        cad  . Hypertension Mother   . Heart disease Sister        half sister MI  . Cancer Sister        BRAIN  . Heart disease Father        heart attack  . Heart disease Brother        cabg  . Heart disease Brother   . Cancer Brother        BLADDER  . Cancer Sister 60       OVARIAN    Social History:  reports that she quit smoking about 55 years ago. She has never used smokeless tobacco. She reports that she does not drink alcohol or use drugs.  She smoked when she was young (quit in 1964).  She lives alone in [place].  Her son lives in [place], [place]. The patient is alone today.  Allergies:  Allergies  Allergen Reactions  . Simvastatin  Other (See Comments)    SORE JOINTS  . Aspirin Other (See Comments)    Bladder spasm  . Diazepam     REACTION: worsened bladder problems  . Hydrochlorothiazide Other (See Comments)  . Chlorhexidine Rash    Skin reacts only to scrub    Current Medications: Current Outpatient Medications  Medication Sig Dispense Refill  . acyclovir (ZOVIRAX) 400 MG tablet Take 400 mg by mouth continuous as needed.    Marland Kitchen atenolol (TENORMIN) 25 MG tablet Take 25 mg 2 (two) times daily by mouth. 50--AM 25--PM    . Biotin 10 MG CAPS Take by mouth.    . diltiazem (CARDIZEM CD) 180 MG 24 hr capsule Take 180 mg by mouth daily.   5  . ELIQUIS 2.5 MG TABS tablet Take 2.5 mg by mouth 2 (two) times daily.     . famotidine (PEPCID) 20 MG tablet Take 20 mg by mouth daily.    . Multiple Vitamins-Minerals (CENTRUM SILVER 50+WOMEN PO) Take 1 tablet by mouth.    Marland Kitchen PARoxetine (PAXIL) 10 MG tablet TAKE ONE TABLET BY MOUTH ONCE DAILY 90 tablet 3  . Probiotic Product (PROBIOTIC DAILY PO) Take 1 capsule by mouth daily.      Marland Kitchen torsemide (DEMADEX) 20 MG tablet TAKE 1/2 (ONE-HALF) TABLET BY MOUTH ONCE DAILY 45 tablet 1  . vitamin B-12 (CYANOCOBALAMIN) 100 MCG tablet Take 100 mcg by mouth daily.    Marland Kitchen zolpidem (AMBIEN) 5 MG tablet TAKE 1 TABLET BY MOUTH AT BEDTIME AS NEEDED 90 tablet 0  . cholecalciferol (VITAMIN D) 1000 units tablet Take 1,000 Units by mouth daily.     No current facility-administered medications for this visit.     Review of Systems:  GENERAL:  Feels good.  No fevers or sweats.  Weight down 1 pound. PERFORMANCE STATUS (ECOG):  1 HEENT:  Cataract surgery planned.  No visual changes, runny nose, sore throat, mouth sores or tenderness. Lungs: Shortness of breath with exertion x 3 years.  Lingering cough.  No hemoptysis. Cardiac:  Atrial fibrillation.  No chest pain, palpitations, orthopnea, or PND. GI:  Appetite good.  No nausea, vomiting, diarrhea, constipation, melena or hematochezia. GU:  No urgency, frequency, dysuria, or hematuria. Musculoskeletal:  No back pain.  No joint pain.  No muscle tenderness. Extremities:  Arthritis.  No lower extremity pain or swelling. Skin: Cyst to her back. "Kernel" to LEFT groin. Bruising, itching, and tender shins.  No rashes or skin changes. Neuro:  No headache, numbness or weakness, balance or coordination issues. Endocrine:  No diabetes, thyroid issues, hot flashes or night sweats. Psych:  No mood changes, depression or anxiety. Pain:  No focal pain. Review of systems:  All other systems reviewed and found to be negative.  Physical Exam: Blood pressure 128/75, pulse 60, temperature (!) 96.2 F (35.7 C), temperature source Tympanic, resp. rate 18, weight 103 lb 12.8 oz (47.1 kg). GENERAL:  Well developed, well nourished, woman sitting comfortably in the exam room in no acute distress. MENTAL STATUS:  Alert and oriented to person, place and time. HEAD:  Dark brown wavy hair.  Normocephalic, atraumatic, face symmetric, no Cushingoid features. EYES:   Glasses.  Blue eyes s/p cataract surgery on the right.  Pupils equal round and reactive to light and accomodation.  No conjunctivitis or scleral icterus. ENT:  Oropharynx clear without lesion.  Tongue normal. Mucous membranes moist.  RESPIRATORY:  Clear to auscultation without rales, wheezes or rhonchi. CARDIOVASCULAR:  Irregular rhythm without murmur, rub  or gallop. CHEST:  Well healed midline sternotomy incision.  Pacemaker. ABDOMEN:  Soft, non-tender, with active bowel sounds, and no hepatosplenomegaly.  No masses. SKIN:  Chronic lower extremity pigment changes (brown).  No vesicles.  No rashes, ulcers or lesions. EXTREMITIES: Arthritis changes in hands.  Left ganglion cyst.  No edema, no skin discoloration or tenderness.  No palpable cords. LYMPH NODES: No palpable cervical, supraclavicular, axillary or inguinal adenopathy  NEUROLOGICAL: Unremarkable. PSYCH:  Appropriate.   Appointment on 11/19/2017  Component Date Value Ref Range Status  . LDH 11/19/2017 149  98 - 192 U/L Final   Performed at [facility], [address] [place], [place] [address]  . Sodium 11/19/2017 136  135 - 145 mmol/L Final  . Potassium 11/19/2017 4.7  3.5 - 5.1 mmol/L Final  . Chloride 11/19/2017 104  101 - 111 mmol/L Final  . CO2 11/19/2017 23  22 - 32 mmol/L Final  . Glucose, Bld 11/19/2017 137* 65 - 99 mg/dL Final  . BUN 11/19/2017 25* 6 - 20 mg/dL Final  . Creatinine, Ser 11/19/2017 1.17* 0.44 - 1.00 mg/dL Final  . Calcium 11/19/2017 9.6  8.9 - 10.3 mg/dL Final  . Total Protein 11/19/2017 6.9  6.5 - 8.1 g/dL Final  . Albumin 11/19/2017 4.2  3.5 - 5.0 g/dL Final  . AST 11/19/2017 26  15 - 41 U/L Final  . ALT 11/19/2017 17  14 - 54 U/L Final  . Alkaline Phosphatase 11/19/2017 103  38 - 126 U/L Final  . Total Bilirubin 11/19/2017 0.8  0.3 - 1.2 mg/dL Final  . GFR calc non Af Amer 11/19/2017 40* >60 mL/min Final  . GFR calc Af Amer 11/19/2017 46* >60 mL/min Final   Comment: (NOTE) The eGFR has  been calculated using the CKD EPI equation. This calculation has not been validated in all clinical situations. eGFR's persistently <60 mL/min signify possible Chronic Kidney Disease.   Georgiann Hahn gap 11/19/2017 9  5 - 15 Final   Performed at [facility], [address] [place], [place] [address]  . WBC 11/19/2017 4.3  3.6 - 11.0 K/uL Final  . RBC 11/19/2017 4.30  3.80 - 5.20 MIL/uL Final  . Hemoglobin 11/19/2017 14.2  12.0 - 16.0 g/dL Final  . HCT 11/19/2017 40.9  35.0 - 47.0 % Final  . MCV 11/19/2017 94.9  80.0 - 100.0 fL Final  . MCH 11/19/2017 33.0  26.0 - 34.0 pg Final  . MCHC 11/19/2017 34.8  32.0 - 36.0 g/dL Final  . RDW 11/19/2017 13.9  11.5 - 14.5 % Final  . Platelets 11/19/2017 234  150 - 440 K/uL Final  . Neutrophils Relative % 11/19/2017 42  % Final  . Neutro Abs 11/19/2017 1.8  1.4 - 6.5 K/uL Final  . Lymphocytes Relative 11/19/2017 24  % Final  . Lymphs Abs 11/19/2017 1.0  1.0 - 3.6 K/uL Final  . Monocytes Relative 11/19/2017 30  % Final  . Monocytes Absolute 11/19/2017 1.3* 0.2 - 0.9 K/uL Final  . Eosinophils Relative 11/19/2017 3  % Final  . Eosinophils Absolute 11/19/2017 0.1  0 - 0.7 K/uL Final  . Basophils Relative 11/19/2017 1  % Final  . Basophils Absolute 11/19/2017 0.0  0 - 0.1 K/uL Final   Performed at [facility], [address] [place], [place] [address]    Assessment:  Joan Hull is a 84 y.o. female with a stage III marginal zone lymphoma.  She presented with a left axillary  mass.  Biopsy on 01/15/2016 revealed an atypical lymphoid proliferation highly suspicious for non-Hodgkin's lymphoma.  There were diffuse sheets of B-cells (CD20 and CD79a +; CD5 and CD10-).  In addition, there was CD138 + abundant plasmacytoid cells with lambda light chain restriction. The lambda staining was apparently present in lymphoid cells.   Lymph node excisional biopsy on 02/06/2016 revealed mature B-cell lymphoma with plasmacytic differentiation, best  classified as a marginal zone lymphoma.  PET scan on 01/31/2016 revealed active lymphoma within the chest, abdomen, and pelvis.  The largest lymph node was 1.7 cm.  Hepatitis B core antibody IgM, hepatitis B surface antigen, and hepatitis C antibody were negative on 01/21/2016.  Hepatitis B core antibody was negative on 02/22/2016.  G6PD assay was negative on 02/22/2016.  She developed acute renal insufficiency after initiation of Septra.  Renal ultrasound on 03/04/2016 revealed no evidence of hydronephrosis.  Baseline creatinine is 0.91 - 1.13 (CrCl 44 - 54 ml/min).  Creatinine was 1.54 (CrCl 29 ml/min) on 03/04/2016 and improved to baseline off Septra.  She received weekly Rituxan x 4 (03/11/2016 - 04/01/2016).  She tolerated her infusions well.  PET scan on 05/01/2016 revealed significant partial metabolic response. There was residual mildly hypermetabolic left axillary and mediastinal lymphoma, decreased in size and metabolism. Additional previously described sites of hypermetabolic lymphoma had resolved.  PET scan on 02/20/2017 revealed no evidence for residual hypermetabolic disease in the neck, chest, abdomen, or pelvis.  She is s/p 6 cycles maintenance Rituxan (06/03/2016 - 08/27/2017).  Symptomatically, she denies any B symptoms.  She has some exertional dyspnea at times. Patient has a cyst on her back that has been there for a while.  Exam reveals no adenopathy or hepatosplenomegaly.    Plan: 1.  Labs today:  CBC with diff, CMP, LDH, uric acid. 2.  Cycle #7 maintenance Rituxan. 3.  Review plans to continue maintenance Rituxan every 3 months x 2 years (8 cycles). 4.  Schedule PET scan in 3 months prior to last maintenance Rituxan. 5.  RTC in 12 weeks for MD assessment, labs (CBC with diff, CMP, LDH, uric acid), cycle #8 maintenance Rituxan, and review of PET scan.   Honor Loh, NP  11/19/2017, 9:52 AM   I saw and evaluated the patient, participating in the key portions of the service  and reviewing pertinent diagnostic studies and records.  I reviewed the nurse practitioner's note and agree with the findings and the plan.  The assessment and plan were discussed with the patient.  Several questions were asked by the patient and answered.   Nolon Stalls, MD 11/19/2017,9:52 AM

## 2017-11-19 NOTE — Progress Notes (Signed)
Here for follow up. Over all stated she is doing well. Stated having SOB x 3 yrs. Pulse ox 97 % on RA .

## 2017-11-20 ENCOUNTER — Other Ambulatory Visit: Payer: Self-pay | Admitting: Urgent Care

## 2017-11-20 ENCOUNTER — Telehealth: Payer: Self-pay | Admitting: *Deleted

## 2017-11-20 MED ORDER — ALLOPURINOL 300 MG PO TABS: 300.0000 mg | ORAL_TABLET | Freq: Every day | ORAL | 0 refills | Status: DC

## 2017-11-20 NOTE — Telephone Encounter (Signed)
Called patient to inquire if she is taking allopurinol.  She states she is not but has taken it in the past.  While on the phone with her she located the bottle that has about 14 tablets left in it. There were no more refills left.  I had her check the expiration date and it is still good.  She was previously taking one 300 mg tablet daily.  I told her to start it again and if MD wants her taking it longer than 14 days a new prescription will be called.  She verbalized understanding.

## 2017-11-20 NOTE — Telephone Encounter (Signed)
  OK to refill  M

## 2017-11-20 NOTE — Telephone Encounter (Signed)
-----   Message from Lequita Asal, MD sent at 11/20/2017  2:45 AM EDT ----- Regarding: RE: Please ask patient (she is in the infusion center)  Please call her.  She previously took it.   She should be on it unless she had a problem with it.  M  ----- Message ----- From: Shirlean Kelly, RN Sent: 11/19/2017   2:05 PM To: Lequita Asal, MD Subject: RE: Please ask patient (she is in the infusi#  Gerald Stabs reviewed her medication this morning. Allopurinol is not listed. ----- Message ----- From: Lequita Asal, MD Sent: 11/19/2017  11:13 AM To: Shirlean Kelly, RN Subject: Please ask patient (she is in the infusion c#   Is she taking allopurinol?  M  ----- Message ----- From: Interface, Lab In [place] Sent: 11/19/2017   9:20 AM To: Lequita Asal, MD

## 2017-11-23 ENCOUNTER — Other Ambulatory Visit: Payer: Self-pay | Admitting: *Deleted

## 2017-11-23 DIAGNOSIS — C8580 Other specified types of non-Hodgkin lymphoma, unspecified site: Secondary | ICD-10-CM

## 2017-12-03 ENCOUNTER — Other Ambulatory Visit: Payer: Self-pay | Admitting: Internal Medicine

## 2017-12-03 NOTE — Telephone Encounter (Signed)
Per Dr Alla German approval, I called it in to [facility].

## 2017-12-03 NOTE — Telephone Encounter (Signed)
Patient is calling in regards to this.

## 2017-12-03 NOTE — Telephone Encounter (Signed)
Called pt and advised her the rx may not be filled until Monday due to [place] having 3 providers this afternoon. She said she has 4 tablets left to last her through Sunday evening.

## 2017-12-07 ENCOUNTER — Inpatient Hospital Stay: Payer: PPO

## 2017-12-07 DIAGNOSIS — Z5112 Encounter for antineoplastic immunotherapy: Secondary | ICD-10-CM | POA: Diagnosis not present

## 2017-12-07 DIAGNOSIS — C8580 Other specified types of non-Hodgkin lymphoma, unspecified site: Secondary | ICD-10-CM

## 2017-12-07 LAB — BASIC METABOLIC PANEL
Anion gap: 11 (ref 5–15)
BUN: 21 mg/dL — ABNORMAL HIGH (ref 6–20)
CO2: 26 mmol/L (ref 22–32)
Calcium: 10 mg/dL (ref 8.9–10.3)
Chloride: 102 mmol/L (ref 101–111)
Creatinine, Ser: 1.09 mg/dL — ABNORMAL HIGH (ref 0.44–1.00)
GFR calc Af Amer: 51 mL/min — ABNORMAL LOW (ref >60)
GFR calc non Af Amer: 44 mL/min — ABNORMAL LOW (ref >60)
Glucose, Bld: 107 mg/dL — ABNORMAL HIGH (ref 65–99)
Potassium: 4.9 mmol/L (ref 3.5–5.1)
Sodium: 139 mmol/L (ref 135–145)

## 2017-12-07 LAB — URIC ACID: Uric Acid, Serum: 2.8 mg/dL (ref 2.3–6.6)

## 2017-12-08 IMAGING — CT NM PET TUM IMG INITIAL (PI) SKULL BASE T - THIGH
1 of 11 series · 1 of 25 positions shown · non-contrast
Comparison: No prior pertinent imaging

CLINICAL DATA: Initial treatment strategy for non-Hodgkin's
lymphoma..

EXAM:
NUCLEAR MEDICINE PET SKULL BASE TO THIGH
TECHNIQUE: 12.1 MCi F-18 FDG was injected intravenously. Full-ring PET imaging
was performed from the skull base to thigh after the radiotracer. CT
data was obtained and used for attenuation correction and anatomic
localization.
FASTING BLOOD GLUCOSE:  Value: 90 mg/dl

[Series 3: ct wb 5.0 b30f · axial · 5.0mm · 0.98mm/px · 1 of 251 slices shown]
[im 251/251  brain]
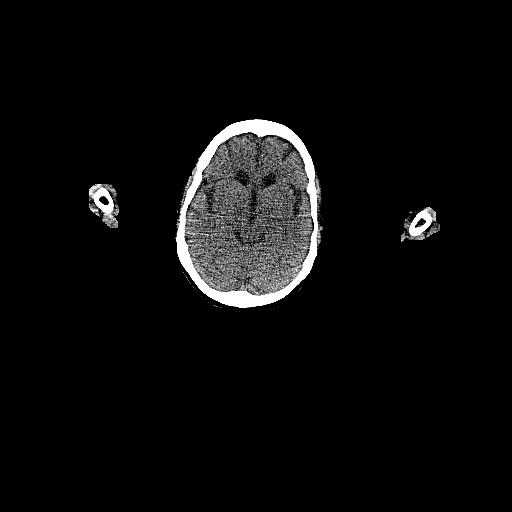

[1 of 25 positions shown; findings below may reference images not displayed]

FINDINGS: NECK

Right cervical hypermetabolism in the region of a 5 mm
jugulodigastric node. This measures a S.U.V. max of 2.9 on image 28/
series 3.

CHEST

Extensive active thoracic lymphoma, including adenopathy in the left
subpectoral and axillary regions. An index left axillary nodal mass
measures 2.9 cm and a S.U.V. max of 10.4 on image 62/series 3.

Mediastinal adenopathy, including a right paratracheal node of 11 mm
and a S.U.V. max of 7.7 on image 63/series 3.

Subcarinal node measures 1.7 cm and a S.U.V. max of 6.6 on image
78/series 3.

Hypermetabolic right upper lobe pulmonary nodule which measures
cm and a S.U.V. max of 3.5 on image 61/series 6.

Mild patchy left base airspace disease with hypermetabolism,
including on a S.U.V. max of 3.2 on image 108/series 3.
Indeterminate.

ABDOMEN/PELVIS

Retroperitoneal hypermetabolic nodes including in the left
periaortic space at 1.6 cm and a S.U.V. max of 6.3 on image
132/series 3. Small bowel mesenteric node measures 9 mm and a S.U.V.
max of 3.1 on image 170/series 3.

Bilateral inguinal hypermetabolic nodes. An index left external
iliac node measures 8 mm and a S.U.V. max of 3.7 on image 187/
series 3.

Focal hypermetabolism within the right pelvic small bowel measures a
S.U.V. max of 5.9, including on image 187/ series 3.

SKELETON

Moderate hypermetabolism involving the left humeral shaft
corresponding to relatively increased medullary density. This
measures a S.U.V. max of 5.4.

CT IMAGES PERFORMED FOR ATTENUATION CORRECTION

No cervical adenopathy. Cardiomegaly with prior median sternotomy.
Pacer. Upper pole left renal low-density lesion is likely a cyst.
Soft tissue density in the region of the left interpolar renal sinus
fat on image 135/series 2.

Abdominal aortic and branch vessel atherosclerosis. Probable
residual follicle at 2.2 cm on the left ovary. Pelvic floor laxity.
Osteopenia. Severe T8 and T11 compression deformities without
hypermetabolism.
IMPRESSION: 1. Active lymphoma within the chest, abdomen, and pelvis, as
detailed above.
2. Right upper lobe hypermetabolic pulmonary nodule is likely
indicative of lymphoma. Left base hypermetabolic airspace disease is
indeterminate and could represent minimal infection.
3. Isolated small mildly hypermetabolic right cervical node.
Technically indeterminate but suspicious for lymphomatous
involvement.
4. Small bowel hypermetabolism is focal and suspicious for
lymphomatous involvement.
5. Subtle soft tissue effacement of the interpolar left renal sinus
fat. Although this could represent a duplicated collecting system,
lymphomatous involvement in this region cannot be excluded.
Recommend attention on follow-up.
6. Suspicion of lymphomatous involvement of the left humeral shaft.
7. Incidental findings, including osteopenia and thoracic
compression deformities.

## 2017-12-16 DIAGNOSIS — H2512 Age-related nuclear cataract, left eye: Secondary | ICD-10-CM | POA: Diagnosis not present

## 2017-12-24 ENCOUNTER — Other Ambulatory Visit: Payer: Self-pay

## 2017-12-25 NOTE — Discharge Instructions (Signed)

## 2017-12-28 ENCOUNTER — Ambulatory Visit
Admission: RE | Admit: 2017-12-28 | Discharge: 2017-12-28 | Disposition: A | Discharge status: Home / Self Care | Payer: PPO | Source: Ambulatory Visit | Attending: Ophthalmology | Admitting: Ophthalmology

## 2017-12-28 ENCOUNTER — Encounter
Admission: RE | Disposition: A | Discharge status: Home / Self Care | Payer: Self-pay | Source: Ambulatory Visit | Attending: Ophthalmology

## 2017-12-28 ENCOUNTER — Ambulatory Visit: Payer: PPO | Admitting: Anesthesiology

## 2017-12-28 DIAGNOSIS — Z950 Presence of cardiac pacemaker: Secondary | ICD-10-CM | POA: Insufficient documentation

## 2017-12-28 DIAGNOSIS — H2512 Age-related nuclear cataract, left eye: Secondary | ICD-10-CM | POA: Insufficient documentation

## 2017-12-28 DIAGNOSIS — E7800 Pure hypercholesterolemia, unspecified: Secondary | ICD-10-CM | POA: Diagnosis not present

## 2017-12-28 DIAGNOSIS — Z951 Presence of aortocoronary bypass graft: Secondary | ICD-10-CM | POA: Diagnosis not present

## 2017-12-28 DIAGNOSIS — Z79899 Other long term (current) drug therapy: Secondary | ICD-10-CM | POA: Diagnosis not present

## 2017-12-28 DIAGNOSIS — Z8572 Personal history of non-Hodgkin lymphomas: Secondary | ICD-10-CM | POA: Insufficient documentation

## 2017-12-28 DIAGNOSIS — K219 Gastro-esophageal reflux disease without esophagitis: Secondary | ICD-10-CM | POA: Insufficient documentation

## 2017-12-28 DIAGNOSIS — I2510 Atherosclerotic heart disease of native coronary artery without angina pectoris: Secondary | ICD-10-CM | POA: Insufficient documentation

## 2017-12-28 DIAGNOSIS — I4891 Unspecified atrial fibrillation: Secondary | ICD-10-CM | POA: Diagnosis not present

## 2017-12-28 DIAGNOSIS — I10 Essential (primary) hypertension: Secondary | ICD-10-CM | POA: Diagnosis not present

## 2017-12-28 DIAGNOSIS — Z87891 Personal history of nicotine dependence: Secondary | ICD-10-CM | POA: Diagnosis not present

## 2017-12-28 DIAGNOSIS — F329 Major depressive disorder, single episode, unspecified: Secondary | ICD-10-CM | POA: Insufficient documentation

## 2017-12-28 DIAGNOSIS — H25812 Combined forms of age-related cataract, left eye: Secondary | ICD-10-CM | POA: Diagnosis not present

## 2017-12-28 DIAGNOSIS — Z7902 Long term (current) use of antithrombotics/antiplatelets: Secondary | ICD-10-CM | POA: Insufficient documentation

## 2017-12-28 HISTORY — DX: Personal history of other infectious and parasitic diseases: Z86.19

## 2017-12-28 HISTORY — PX: CATARACT EXTRACTION W/PHACO: SHX586

## 2017-12-28 HISTORY — DX: Presence of cardiac pacemaker: Z95.0

## 2017-12-28 SURGERY — PHACOEMULSIFICATION, CATARACT, WITH IOL INSERTION
Anesthesia: Monitor Anesthesia Care | Site: Eye | Laterality: Left | Wound class: Clean

## 2017-12-28 MED ORDER — MIDAZOLAM HCL 2 MG/2ML IJ SOLN
INTRAMUSCULAR | Status: DC | PRN
Start: 2017-12-28 — End: 2017-12-28
  Administered 2017-12-28: 0.5 mg via INTRAVENOUS

## 2017-12-28 MED ORDER — NA HYALUR & NA CHOND-NA HYALUR 0.4-0.35 ML IO KIT
PACK | INTRAOCULAR | Status: DC | PRN
  Administered 2017-12-28: 1 mL via INTRAOCULAR

## 2017-12-28 MED ORDER — MOXIFLOXACIN HCL 0.5 % OP SOLN
1.0000 [drp] | OPHTHALMIC | Status: DC | PRN
  Administered 2017-12-28 (×3): 1 [drp] via OPHTHALMIC

## 2017-12-28 MED ORDER — FENTANYL CITRATE (PF) 100 MCG/2ML IJ SOLN
INTRAMUSCULAR | Status: DC | PRN
  Administered 2017-12-28: 50 ug via INTRAVENOUS

## 2017-12-28 MED ORDER — CEFUROXIME OPHTHALMIC INJECTION 1 MG/0.1 ML
INJECTION | OPHTHALMIC | Status: DC | PRN
  Administered 2017-12-28: 0.1 mL via INTRACAMERAL

## 2017-12-28 MED ORDER — EPINEPHRINE PF 1 MG/ML IJ SOLN
INTRAOCULAR | Status: DC | PRN
  Administered 2017-12-28: 65 mL via OPHTHALMIC

## 2017-12-28 MED ORDER — BRIMONIDINE TARTRATE-TIMOLOL 0.2-0.5 % OP SOLN
OPHTHALMIC | Status: DC | PRN
  Administered 2017-12-28: 1 [drp] via OPHTHALMIC

## 2017-12-28 MED ORDER — FENTANYL CITRATE (PF) 100 MCG/2ML IJ SOLN: INTRAMUSCULAR | Status: DC | PRN

## 2017-12-28 MED ORDER — LACTATED RINGERS IV SOLN: INTRAVENOUS | Status: DC

## 2017-12-28 MED ORDER — MIDAZOLAM HCL 2 MG/2ML IJ SOLN: INTRAMUSCULAR | Status: DC | PRN

## 2017-12-28 MED ORDER — LIDOCAINE HCL (PF) 2 % IJ SOLN
INTRAOCULAR | Status: DC | PRN
  Administered 2017-12-28: 1 mL

## 2017-12-28 MED ORDER — ARMC OPHTHALMIC DILATING DROPS
1.0000 "application " | OPHTHALMIC | Status: DC | PRN
  Administered 2017-12-28 (×3): 1 via OPHTHALMIC

## 2017-12-28 SURGICAL SUPPLY — 19 items
CANNULA ANT/CHMB 27GA (MISCELLANEOUS) ×3 IMPLANT
GLOVE SURG LX 7.5 STRW (GLOVE) ×2
GLOVE SURG LX STRL 7.5 STRW (GLOVE) ×1 IMPLANT
GLOVE SURG TRIUMPH 8.0 PF LTX (GLOVE) ×3 IMPLANT
GOWN STRL REUS W/ TWL LRG LVL3 (GOWN DISPOSABLE) ×2 IMPLANT
GOWN STRL REUS W/TWL LRG LVL3 (GOWN DISPOSABLE) ×4
LENS IOL ACRSF IQ ULTRA 23.0 (Intraocular Lens) ×1 IMPLANT
LENS IOL ACRYSOF IQ 23.0 (Intraocular Lens) ×3 IMPLANT
MARKER SKIN DUAL TIP RULER LAB (MISCELLANEOUS) ×3 IMPLANT
NEEDLE FILTER BLUNT 18X 1/2SAF (NEEDLE) ×2
NEEDLE FILTER BLUNT 18X1 1/2 (NEEDLE) ×1 IMPLANT
PACK CATARACT BRASINGTON (MISCELLANEOUS) ×3 IMPLANT
PACK EYE AFTER SURG (MISCELLANEOUS) ×3 IMPLANT
PACK OPTHALMIC (MISCELLANEOUS) ×3 IMPLANT
SYR 3ML LL SCALE MARK (SYRINGE) ×3 IMPLANT
SYR 5ML LL (SYRINGE) ×3 IMPLANT
SYR TB 1ML LUER SLIP (SYRINGE) ×3 IMPLANT
WATER STERILE IRR 500ML POUR (IV SOLUTION) ×3 IMPLANT
WIPE NON LINTING 3.25X3.25 (MISCELLANEOUS) ×3 IMPLANT

## 2017-12-28 NOTE — Anesthesia Postprocedure Evaluation (Signed)
Anesthesia Post Note  Patient: Joan Hull  Procedure(s) Performed: CATARACT EXTRACTION PHACO AND INTRAOCULAR LENS PLACEMENT ([place]) LEFT (Left Eye)  Patient location during evaluation: PACU Anesthesia Type: MAC Level of consciousness: awake and alert and oriented Pain management: satisfactory to patient Vital Signs Assessment: post-procedure vital signs reviewed and stable Respiratory status: spontaneous breathing, nonlabored ventilation and respiratory function stable Cardiovascular status: blood pressure returned to baseline and stable Postop Assessment: Adequate PO intake and No signs of nausea or vomiting Anesthetic complications: no    Raliegh Ip

## 2017-12-28 NOTE — Transfer of Care (Signed)
Immediate Anesthesia Transfer of Care Note  Patient: Joan Hull  Procedure(s) Performed: CATARACT EXTRACTION PHACO AND INTRAOCULAR LENS PLACEMENT ([facility]) LEFT (Left Eye)  Patient Location: PACU  Anesthesia Type: MAC  Level of Consciousness: awake, alert  and patient cooperative  Airway and Oxygen Therapy: Patient Spontanous Breathing and Patient connected to supplemental oxygen  Post-op Assessment: Post-op Vital signs reviewed, Patient's Cardiovascular Status Stable, Respiratory Function Stable, Patent Airway and No signs of Nausea or vomiting  Post-op Vital Signs: Reviewed and stable  Complications: No apparent anesthesia complications

## 2017-12-28 NOTE — Anesthesia Procedure Notes (Signed)
Date/Time: 12/28/2017 10:40 AM Performed by: Cameron Ali, CRNA Pre-anesthesia Checklist: Patient identified, Emergency Drugs available, Suction available, Timeout performed and Patient being monitored Patient Re-evaluated:Patient Re-evaluated prior to induction Oxygen Delivery Method: Nasal cannula Placement Confirmation: positive ETCO2

## 2017-12-28 NOTE — Anesthesia Procedure Notes (Deleted)
Performed by: Miyuki Rzasa, CRNA Pre-anesthesia Checklist: Patient identified, Emergency Drugs available, Suction available, Timeout performed and Patient being monitored Patient Re-evaluated:Patient Re-evaluated prior to induction Oxygen Delivery Method: Nasal cannula Placement Confirmation: positive ETCO2       

## 2017-12-28 NOTE — Op Note (Signed)
OPERATIVE NOTE  Joan Hull [id number] 12/28/2017   PREOPERATIVE DIAGNOSIS:  Nuclear sclerotic cataract left eye. H25.12   POSTOPERATIVE DIAGNOSIS:    Nuclear sclerotic cataract left eye.     PROCEDURE:  Phacoemusification with posterior chamber intraocular lens placement of the left eye   LENS:   Implant Name Type Inv. Item Serial No. Manufacturer Lot No. LRB No. Used  LENS IOL ACRYSOF IQ 23.0 - [id number] Intraocular Lens LENS IOL ACRYSOF IQ 23.0 [id number] ALCON  Left 1        ULTRASOUND TIME: 18  % of 1 minutes 4 seconds, CDE 11.3  SURGEON:  Wyonia Hough, MD   ANESTHESIA:  Topical with tetracaine drops and 2% Xylocaine jelly, augmented with 1% preservative-free intracameral lidocaine.    COMPLICATIONS:  None.   DESCRIPTION OF PROCEDURE:  The patient was identified in the holding room and transported to the operating room and placed in the supine position under the operating microscope.  The left eye was identified as the operative eye and it was prepped and draped in the usual sterile ophthalmic fashion.   A 1 millimeter clear-corneal paracentesis was made at the 1:30 position.  0.5 ml of preservative-free 1% lidocaine was injected into the anterior chamber.  The anterior chamber was filled with Viscoat viscoelastic.  A 2.4 millimeter keratome was used to make a near-clear corneal incision at the 10:30 position.  .  A curvilinear capsulorrhexis was made with a cystotome and capsulorrhexis forceps.  Balanced salt solution was used to hydrodissect and hydrodelineate the nucleus.   Phacoemulsification was then used in stop and chop fashion to remove the lens nucleus and epinucleus.  The remaining cortex was then removed using the irrigation and aspiration handpiece. Provisc was then placed into the capsular bag to distend it for lens placement.  A lens was then injected into the capsular bag.  The remaining viscoelastic was aspirated.   Wounds were hydrated with  balanced salt solution.  The anterior chamber was inflated to a physiologic pressure with balanced salt solution.  No wound leaks were noted. Cefuroxime 0.1 ml of a 10mg /ml solution was injected into the anterior chamber for a dose of 1 mg of intracameral antibiotic at the completion of the case.   Timolol and Brimonidine drops were applied to the eye.  The patient was taken to the recovery room in stable condition without complications of anesthesia or surgery.  [name] 12/28/2017, 10:58 AM

## 2017-12-28 NOTE — H&P (Signed)
The History and Physical notes are on paper, have been signed, and are to be scanned. The patient remains stable and unchanged from the H&P.   Previous H&P reviewed, patient examined, and there are no changes.  Joan Hull 12/28/2017 10:00 AM

## 2017-12-28 NOTE — Anesthesia Preprocedure Evaluation (Signed)
Anesthesia Evaluation  Patient identified by MRN, date of birth, ID band Patient awake    Reviewed: Allergy & Precautions, H&P , NPO status , Patient's Chart, lab work & pertinent test results  Airway Mallampati: I  TM Distance: >3 FB Neck ROM: full    Dental no notable dental hx.    Pulmonary shortness of breath, COPD, former smoker,    Pulmonary exam normal breath sounds clear to auscultation       Cardiovascular hypertension, + CAD and + Past MI  Normal cardiovascular exam+ pacemaker  Rhythm:regular Rate:Normal     Neuro/Psych PSYCHIATRIC DISORDERS    GI/Hepatic GERD  ,  Endo/Other    Renal/GU      Musculoskeletal   Abdominal   Peds  Hematology   Anesthesia Other Findings   Reproductive/Obstetrics                             Anesthesia Physical Anesthesia Plan  ASA: III  Anesthesia Plan: MAC   Post-op Pain Management:    Induction:   PONV Risk Score and Plan: 2 and Midazolam and Treatment may vary due to age or medical condition  Airway Management Planned:   Additional Equipment:   Intra-op Plan:   Post-operative Plan:   Informed Consent: I have reviewed the patients History and Physical, chart, labs and discussed the procedure including the risks, benefits and alternatives for the proposed anesthesia with the patient or authorized representative who has indicated his/her understanding and acceptance.     Plan Discussed with: CRNA  Anesthesia Plan Comments:         Anesthesia Quick Evaluation

## 2017-12-29 ENCOUNTER — Encounter: Payer: Self-pay | Admitting: Ophthalmology

## 2018-02-15 ENCOUNTER — Ambulatory Visit
Admission: RE | Admit: 2018-02-15 | Discharge: 2018-02-15 | Disposition: A | Discharge status: Home / Self Care | Payer: PPO | Source: Ambulatory Visit | Attending: Urgent Care | Admitting: Urgent Care

## 2018-02-15 DIAGNOSIS — R911 Solitary pulmonary nodule: Secondary | ICD-10-CM | POA: Diagnosis not present

## 2018-02-15 DIAGNOSIS — I700 Atherosclerosis of aorta: Secondary | ICD-10-CM | POA: Insufficient documentation

## 2018-02-15 DIAGNOSIS — R590 Localized enlarged lymph nodes: Secondary | ICD-10-CM | POA: Diagnosis not present

## 2018-02-15 DIAGNOSIS — C8580 Other specified types of non-Hodgkin lymphoma, unspecified site: Secondary | ICD-10-CM | POA: Diagnosis not present

## 2018-02-15 DIAGNOSIS — M4854XA Collapsed vertebra, not elsewhere classified, thoracic region, initial encounter for fracture: Secondary | ICD-10-CM | POA: Diagnosis not present

## 2018-02-15 DIAGNOSIS — C8590 Non-Hodgkin lymphoma, unspecified, unspecified site: Secondary | ICD-10-CM | POA: Diagnosis not present

## 2018-02-15 LAB — GLUCOSE, CAPILLARY: GLUCOSE-CAPILLARY: 102 mg/dL — AB (ref 70–99)

## 2018-02-15 MED ORDER — FLUDEOXYGLUCOSE F - 18 (FDG) INJECTION
5.3000 | Freq: Once | INTRAVENOUS | Status: AC | PRN
  Administered 2018-02-15: 5.56 via INTRAVENOUS

## 2018-02-19 ENCOUNTER — Inpatient Hospital Stay: Payer: PPO | Attending: Hematology and Oncology

## 2018-02-19 ENCOUNTER — Encounter: Payer: Self-pay | Admitting: Hematology and Oncology

## 2018-02-19 ENCOUNTER — Inpatient Hospital Stay: Payer: PPO

## 2018-02-19 ENCOUNTER — Inpatient Hospital Stay (HOSPITAL_BASED_OUTPATIENT_CLINIC_OR_DEPARTMENT_OTHER): Payer: PPO | Admitting: Hematology and Oncology

## 2018-02-19 VITALS — BP 133/82 | Temp 96.7°F | Resp 18 | Wt 103.5 lb

## 2018-02-19 DIAGNOSIS — L989 Disorder of the skin and subcutaneous tissue, unspecified: Secondary | ICD-10-CM | POA: Diagnosis not present

## 2018-02-19 DIAGNOSIS — C8580 Other specified types of non-Hodgkin lymphoma, unspecified site: Secondary | ICD-10-CM

## 2018-02-19 DIAGNOSIS — Z9221 Personal history of antineoplastic chemotherapy: Secondary | ICD-10-CM | POA: Diagnosis not present

## 2018-02-19 DIAGNOSIS — I4891 Unspecified atrial fibrillation: Secondary | ICD-10-CM | POA: Diagnosis not present

## 2018-02-19 DIAGNOSIS — C884 Extranodal marginal zone B-cell lymphoma of mucosa-associated lymphoid tissue [MALT-lymphoma]: Secondary | ICD-10-CM | POA: Diagnosis not present

## 2018-02-19 DIAGNOSIS — R599 Enlarged lymph nodes, unspecified: Secondary | ICD-10-CM | POA: Insufficient documentation

## 2018-02-19 LAB — CBC WITH DIFFERENTIAL/PLATELET
Basophils Absolute: 0 10*3/uL (ref 0–0.1)
Basophils Relative: 1 %
Eosinophils Absolute: 0.1 10*3/uL (ref 0–0.7)
Eosinophils Relative: 3 %
HCT: 41.8 % (ref 35.0–47.0)
Hemoglobin: 14.5 g/dL (ref 12.0–16.0)
Lymphocytes Relative: 27 %
Lymphs Abs: 1.1 10*3/uL (ref 1.0–3.6)
MCH: 33.9 pg (ref 26.0–34.0)
MCHC: 34.6 g/dL (ref 32.0–36.0)
MCV: 98.2 fL (ref 80.0–100.0)
Monocytes Absolute: 1.1 10*3/uL — ABNORMAL HIGH (ref 0.2–0.9)
Monocytes Relative: 27 %
Neutro Abs: 1.7 10*3/uL (ref 1.4–6.5)
Neutrophils Relative %: 42 %
Platelets: 214 10*3/uL (ref 150–440)
RBC: 4.26 MIL/uL (ref 3.80–5.20)
RDW: 14.8 % — ABNORMAL HIGH (ref 11.5–14.5)
WBC: 4 10*3/uL (ref 3.6–11.0)

## 2018-02-19 LAB — COMPREHENSIVE METABOLIC PANEL
ALT: 17 U/L (ref 0–44)
AST: 24 U/L (ref 15–41)
Albumin: 4.4 g/dL (ref 3.5–5.0)
Alkaline Phosphatase: 106 U/L (ref 38–126)
Anion gap: 12 (ref 5–15)
BUN: 32 mg/dL — ABNORMAL HIGH (ref 8–23)
CO2: 24 mmol/L (ref 22–32)
Calcium: 9.5 mg/dL (ref 8.9–10.3)
Chloride: 105 mmol/L (ref 98–111)
Creatinine, Ser: 1.15 mg/dL — ABNORMAL HIGH (ref 0.44–1.00)
GFR calc Af Amer: 47 mL/min — ABNORMAL LOW (ref >60)
GFR calc non Af Amer: 41 mL/min — ABNORMAL LOW (ref >60)
Glucose, Bld: 102 mg/dL — ABNORMAL HIGH (ref 70–99)
Potassium: 3.8 mmol/L (ref 3.5–5.1)
Sodium: 141 mmol/L (ref 135–145)
Total Bilirubin: 0.8 mg/dL (ref 0.3–1.2)
Total Protein: 7.1 g/dL (ref 6.5–8.1)

## 2018-02-19 LAB — URIC ACID: Uric Acid, Serum: 2.7 mg/dL (ref 2.5–7.1)

## 2018-02-19 LAB — LACTATE DEHYDROGENASE: LDH: 164 U/L (ref 98–192)

## 2018-02-19 NOTE — Progress Notes (Signed)
[facility] day:  02/19/2018    Chief Complaint: Joan Hull is a 84 y.o. female with stage III marginal zone lymphoma s/p Rituxan who is seen for a 12 week assessment prior to cycle #8 maintenance Rituxan.  HPI:  The patient was last seen in the medical oncology clinic on 11/19/2017.  At that time, she denied any B symptoms.  She had some exertional dyspnea at times.  Exam reveaed no adenopathy or hepatosplenomegaly.  She received cycle #7 Rituxan.  PET scan on 02/15/2018 revealed a new enlarged and hypermetabolic left inguinal lymph node concerning for recurrent tumor (6 mm with SUV 1.77 to 14 mm with SUV 7.33).  The perifissural nodule in the right lung measured 7 mm (previously 5 mm).  Symptomatically, she feels good.  She notes shortness of breath at times which she describes as "aggravating".  She states that her atrial fibrillation has been under good control.  She has had better vision since cataract surgery.   Past Medical History:  Diagnosis Date  . Anxiety   . Arthritis    fingers  . Atrial fibrillation ([place])   . CAD (coronary artery disease)   . CAD (coronary artery disease)   . Cancer ([place]) 2017   non-hodgkins lymphoma  . Depression   . Dysrhythmia    A-Fib  . GERD (gastroesophageal reflux disease)   . H/O Clostridium difficile infection   . Heart murmur   . History of immunotherapy 02/20/2017  . Hx of colonic polyps   . Hyperlipidemia   . Hypertension   . Interstitial cystitis   . Non Q wave myocardial infarction ([place]) 06/27/11   [facility]  . Osteoporosis   . Pacemaker   . Presence of permanent cardiac pacemaker   . Shortness of breath dyspnea    doe    Past Surgical History:  Procedure Laterality Date  . AXILLARY LYMPH NODE BIOPSY Left 02/06/2016   Procedure: AXILLARY LYMPH NODE BIOPSY;  Surgeon: [name], MD;  Location: [facility] ORS;  Service: General;  Laterality: Left;  . CARDIAC CATHETERIZATION    .  CATARACT EXTRACTION Right 2005  . CATARACT EXTRACTION W/PHACO Left 12/28/2017   Procedure: CATARACT EXTRACTION PHACO AND INTRAOCULAR LENS PLACEMENT ([place]) LEFT;  Surgeon: Leandrew Koyanagi, MD;  Location: [facility];  Service: Ophthalmology;  Laterality: Left;  . COLONOSCOPY  2007   [name]  . CORONARY ANGIOPLASTY    . CORONARY ARTERY BYPASS GRAFT  1/13   [facility], X4  . CORONARY STENT PLACEMENT  7/12   RCA with drug eluting stent---[name]  . EYE SURGERY    . INSERT / REPLACE / REMOVE PACEMAKER    . PACEMAKER INSERTION  5/13   [facility]    Family History  Problem Relation Age of Onset  . Heart disease Mother        cad  . Hypertension Mother   . Heart disease Sister        half sister MI  . Cancer Sister        BRAIN  . Heart disease Father        heart attack  . Heart disease Brother        cabg  . Heart disease Brother   . Cancer Brother        BLADDER  . Cancer Sister 54       OVARIAN    Social History:  reports that she quit smoking about 55 years  ago. Her smoking use included cigarettes. She has a 3.00 pack-year smoking history. She has never used smokeless tobacco. She reports that she does not drink alcohol or use drugs.  She smoked when she was young (quit in 1964).  She lives alone in [place].  Her son lives in [place], [place]. The patient is alone today.  Allergies:  Allergies  Allergen Reactions  . Simvastatin Other (See Comments)    SORE JOINTS  . Aspirin Other (See Comments)    Bladder spasm  . Diazepam     REACTION: worsened bladder problems  . Hydrochlorothiazide Other (See Comments)  . Chlorhexidine Rash    Skin reacts only to scrub    Current Medications: Current Outpatient Medications  Medication Sig Dispense Refill  . acyclovir (ZOVIRAX) 400 MG tablet Take 400 mg by mouth continuous as needed.    Marland Kitchen allopurinol (ZYLOPRIM) 300 MG tablet Take 1 tablet (300 mg total) by mouth daily. 90 tablet 0  . atenolol (TENORMIN) 25 MG  tablet Take 25 mg 2 (two) times daily by mouth. 50--AM 25--PM    . Biotin 10 MG CAPS Take by mouth.    . cholecalciferol (VITAMIN D) 1000 units tablet Take 1,000 Units by mouth daily.    Marland Kitchen diltiazem (CARDIZEM CD) 180 MG 24 hr capsule Take 180 mg by mouth daily.   5  . ELIQUIS 2.5 MG TABS tablet Take 2.5 mg by mouth 2 (two) times daily.     . famotidine (PEPCID) 20 MG tablet Take 20 mg by mouth daily.    . Multiple Vitamins-Minerals (CENTRUM SILVER 50+WOMEN PO) Take 1 tablet by mouth.    Marland Kitchen PARoxetine (PAXIL) 10 MG tablet TAKE ONE TABLET BY MOUTH ONCE DAILY 90 tablet 3  . Probiotic Product (PROBIOTIC DAILY PO) Take 1 capsule by mouth daily.     Marland Kitchen torsemide (DEMADEX) 20 MG tablet TAKE 1/2 (ONE-HALF) TABLET BY MOUTH ONCE DAILY 45 tablet 1  . vitamin B-12 (CYANOCOBALAMIN) 100 MCG tablet Take 100 mcg by mouth daily.    Marland Kitchen zolpidem (AMBIEN) 5 MG tablet TAKE 1 TABLET BY MOUTH AT BEDTIME AS NEEDED 90 tablet 0   No current facility-administered medications for this visit.     Review of Systems:  GENERAL:  Feels "pretty good".  No fevers, sweats or weight loss. PERFORMANCE STATUS (ECOG):  1 HEENT:  s/p cataract surgery with improvement in vision.  No runny nose, sore throat, mouth sores or tenderness. Lungs: Shortness of breath with exertion (no change).  No cough.  No hemoptysis. Cardiac:  Atrial fibrillation (controlled).  No chest pain, palpitations, orthopnea, or PND. GI:  No nausea, vomiting, diarrhea, constipation, melena or hematochezia. GU:  No urgency, frequency, dysuria, or hematuria. Musculoskeletal:  No back pain.  Arthritis.  No muscle tenderness. Extremities:  No pain or swelling. Skin:  Cyst on back (stable).  Black mole on upper back.  No rashes or skin changes. Neuro:  No headache, numbness or weakness, balance or coordination issues. Endocrine:  No diabetes, thyroid issues, hot flashes or night sweats. Psych:  No mood changes, depression or anxiety. Pain:  No focal pain. Review  of systems:  All other systems reviewed and found to be negative.   Physical Exam: Blood pressure 133/82, temperature (!) 96.7 F (35.9 C), temperature source Tympanic, resp. rate 18, weight 103 lb 8 oz (46.9 kg). GENERAL:  Well developed, well nourished, woman sitting comfortably in the exam room in no acute distress. MENTAL STATUS:  Alert and oriented to person,  place and time. HEAD:  [place] styled hair.  Normocephalic, atraumatic, face symmetric, no Cushingoid features. EYES:  Glasses.  Blue eyes s/p cataract surgery.  Pupils equal round and reactive to light and accomodation.  No conjunctivitis or scleral icterus. ENT:  Oropharynx clear without lesion.  Tongue normal. Mucous membranes moist.  RESPIRATORY:  Clear to auscultation without rales, wheezes or rhonchi. CARDIOVASCULAR:  Irregular rhythm without murmur, rub or gallop.   CHEST WALL:  Pacemaker. ABDOMEN:  Soft, non-tender, with active bowel sounds, and no hepatosplenomegaly.  No masses. SKIN:  Left upper back black pigmented palpable lesion.  Chronic lower extremity pigment changes (brown).  No rashes, ulcers or lesions. EXTREMITIES: Arthritis changes in hands.  No edema, no skin discoloration or tenderness.  No palpable cords. LYMPH NODES: Fingertip left inguinal node.  No palpable cervical, supraclavicular, or axillary adenopathy  NEUROLOGICAL: Unremarkable. PSYCH:  Appropriate.    Appointment on 02/19/2018  Component Date Value Ref Range Status  . LDH 02/19/2018 164  98 - 192 U/L Final   Performed at [facility], [address] [place], [place] [address]  . Sodium 02/19/2018 141  135 - 145 mmol/L Final  . Potassium 02/19/2018 3.8  3.5 - 5.1 mmol/L Final  . Chloride 02/19/2018 105  98 - 111 mmol/L Final   Please note change in reference range.  . CO2 02/19/2018 24  22 - 32 mmol/L Final  . Glucose, Bld 02/19/2018 102* 70 - 99 mg/dL Final   Please note change in reference range.  . BUN 02/19/2018 32* 8 - 23 mg/dL  Final   Please note change in reference range.  . Creatinine, Ser 02/19/2018 1.15* 0.44 - 1.00 mg/dL Final  . Calcium 02/19/2018 9.5  8.9 - 10.3 mg/dL Final  . Total Protein 02/19/2018 7.1  6.5 - 8.1 g/dL Final  . Albumin 02/19/2018 4.4  3.5 - 5.0 g/dL Final  . AST 02/19/2018 24  15 - 41 U/L Final  . ALT 02/19/2018 17  0 - 44 U/L Final   Please note change in reference range.  . Alkaline Phosphatase 02/19/2018 106  38 - 126 U/L Final  . Total Bilirubin 02/19/2018 0.8  0.3 - 1.2 mg/dL Final  . GFR calc non Af Amer 02/19/2018 41* >60 mL/min Final  . GFR calc Af Amer 02/19/2018 47* >60 mL/min Final   Comment: (NOTE) The eGFR has been calculated using the CKD EPI equation. This calculation has not been validated in all clinical situations. eGFR's persistently <60 mL/min signify possible Chronic Kidney Disease.   Georgiann Hahn gap 02/19/2018 12  5 - 15 Final   Performed at [facility], [address] [place], [place] [address]    Assessment:  Joan Hull is a 84 y.o. female with a stage III marginal zone lymphoma.  She presented with a left axillary mass.  Biopsy on 01/15/2016 revealed an atypical lymphoid proliferation highly suspicious for non-Hodgkin's lymphoma.  There were diffuse sheets of B-cells (CD20 and CD79a +; CD5 and CD10-).  In addition, there was CD138 + abundant plasmacytoid cells with lambda light chain restriction. The lambda staining was apparently present in lymphoid cells.   Lymph node excisional biopsy on 02/06/2016 revealed mature B-cell lymphoma with plasmacytic differentiation, best classified as a marginal zone lymphoma.  PET scan on 01/31/2016 revealed active lymphoma within the chest, abdomen, and pelvis.  The largest lymph node was 1.7 cm.  Hepatitis B core antibody IgM, hepatitis B surface antigen, and hepatitis C antibody were  negative on 01/21/2016.  Hepatitis B core antibody was negative on 02/22/2016.  G6PD assay was negative on 02/22/2016.  She  developed acute renal insufficiency after initiation of Septra.  Renal ultrasound on 03/04/2016 revealed no evidence of hydronephrosis.  Baseline creatinine is 0.91 - 1.13 (CrCl 44 - 54 ml/min).  Creatinine was 1.54 (CrCl 29 ml/min) on 03/04/2016 and improved to baseline off Septra.  She received weekly Rituxan x 4 (03/11/2016 - 04/01/2016).  She tolerated her infusions well.  PET scan on 05/01/2016 revealed significant partial metabolic response. There was residual mildly hypermetabolic left axillary and mediastinal lymphoma, decreased in size and metabolism. Additional previously described sites of hypermetabolic lymphoma had resolved.  PET scan on 02/20/2017 revealed no evidence for residual hypermetabolic disease in the neck, chest, abdomen, or pelvis.  PET scan on 02/15/2018 revealed a new enlarged and hypermetabolic left inguinal lymph node concerning for recurrent tumor (6 mm with SUV 1.77 to 14 mm with SUV 7.33).  The perifissural nodule in the right lung measured 7 mm (previously 5 mm).  She is s/p 7 cycles maintenance Rituxan (06/03/2016 - 11/19/2017).  Symptomatically, she denies any B symptoms.  Exam reveals a fingertip left inguinal node.  She has a palpable black lesion on her left upper back.  Plan: 1.  Labs today:  CBC with diff, CMP, LDH, uric acid. 2.  Review interval PET scan.  Discuss enlarging left inguinal node.  Discuss plan to discontinue maintenance Rituxan.  Discuss close monitoring. 3.  Discuss indications for treatment: bulky disease, B symptoms, threatened end-organ function.  Multiple questions asked and answered. 4.  Refill allopurinol. 5.  Discuss follow-up with dermatology.  Patient states that she will schedule. 6.  RTC in 1 month for MD assessment and labs (CBC with diff, CMP, LDH, uric acid).   Lequita Asal, MD  02/19/2018, 8:31 AM

## 2018-02-20 MED ORDER — ALLOPURINOL 300 MG PO TABS: 300.0000 mg | ORAL_TABLET | Freq: Every day | ORAL | 0 refills | Status: DC

## 2018-02-22 ENCOUNTER — Other Ambulatory Visit: Payer: Self-pay | Admitting: Internal Medicine

## 2018-02-23 NOTE — Telephone Encounter (Signed)
Approved: okay x 1 year 

## 2018-03-01 ENCOUNTER — Other Ambulatory Visit: Payer: Self-pay | Admitting: Internal Medicine

## 2018-03-02 ENCOUNTER — Other Ambulatory Visit: Payer: Self-pay | Admitting: Internal Medicine

## 2018-03-02 NOTE — Telephone Encounter (Signed)
Last filled 12-03-17 #90 Last CPE 06-26-17 Last Acute 09-08-17 No Future OV  [facility] [address]

## 2018-03-16 DIAGNOSIS — I495 Sick sinus syndrome: Secondary | ICD-10-CM | POA: Diagnosis not present

## 2018-03-16 DIAGNOSIS — I10 Essential (primary) hypertension: Secondary | ICD-10-CM | POA: Diagnosis not present

## 2018-03-16 DIAGNOSIS — I209 Angina pectoris, unspecified: Secondary | ICD-10-CM | POA: Diagnosis not present

## 2018-03-16 DIAGNOSIS — I25810 Atherosclerosis of coronary artery bypass graft(s) without angina pectoris: Secondary | ICD-10-CM | POA: Diagnosis not present

## 2018-03-16 DIAGNOSIS — I4891 Unspecified atrial fibrillation: Secondary | ICD-10-CM | POA: Diagnosis not present

## 2018-03-21 NOTE — Progress Notes (Signed)
[facility] day:  03/22/2018    Chief Complaint: Joan Hull is a 84 y.o. female with stage III marginal zone lymphoma s/p Rituxan who is seen for a 1 month assessment.   HPI:  The patient was last seen in the medical oncology clinic on 02/19/2018.  At that time, patient was feeling well. She complained of intermittent shortness of breath that she described as "aggravating". A.fib had been under control, with no RVR episodes requiring urgent intervention. Recent PET imaging demonstrated a hypermetabolic LEFT inguinal lymph node concerning for recurrent tumor.   Exam revealed a fingertip LEFT inguinal node, and a palpable black lesion to the superior aspect of her posterior torso.  She was referred to dermatology for further evaluation.  WBC 4000 ([place] 1700).  BUN 32 and creatinine 1.15. The decision was made to continue maintenance Rituxan therapy and monitor.  In the interim, patient is doing well overall. She has not been as "short winded" and her energy has been "a little bit better". She notes that when she eats, she "swells up like a frog" because of the fluid. Patient self increased her fluid pill. Patient states, "I let me doctor know that I did it though". Patient notes that her heart has been stable. She states, "every once in awhile it will cut a flip".    Patient has a black nevus to her back. To date, she has not been seen in consult by dermatology. She has been seen in the past by Dr. Nehemiah Massed (dermatology). Patient denies that she has experienced any B symptoms. She denies any interval infections. She has no acute concerns today.   Patient advises that she maintains an adequate appetite. She is eating well. Weight today is 104 lb 5 oz (47.3 kg), which compared to her last visit to the clinic, represents a 1 pound increase.    Patient denies pain in the clinic today.   Past Medical History:  Diagnosis Date  . Anxiety   . Arthritis     fingers  . Atrial fibrillation ([place])   . CAD (coronary artery disease)   . CAD (coronary artery disease)   . Cancer ([place]) 2017   non-hodgkins lymphoma  . Depression   . Dysrhythmia    A-Fib  . GERD (gastroesophageal reflux disease)   . H/O Clostridium difficile infection   . Heart murmur   . History of immunotherapy 02/20/2017  . Hx of colonic polyps   . Hyperlipidemia   . Hypertension   . Interstitial cystitis   . Non Q wave myocardial infarction ([place]) 06/27/11   [facility]  . Osteoporosis   . Pacemaker   . Presence of permanent cardiac pacemaker   . Shortness of breath dyspnea    doe    Past Surgical History:  Procedure Laterality Date  . AXILLARY LYMPH NODE BIOPSY Left 02/06/2016   Procedure: AXILLARY LYMPH NODE BIOPSY;  Surgeon: [name], MD;  Location: [facility] ORS;  Service: General;  Laterality: Left;  . CARDIAC CATHETERIZATION    . CATARACT EXTRACTION Right 2005  . CATARACT EXTRACTION W/PHACO Left 12/28/2017   Procedure: CATARACT EXTRACTION PHACO AND INTRAOCULAR LENS PLACEMENT ([place]) LEFT;  Surgeon: Leandrew Koyanagi, MD;  Location: [place];  Service: Ophthalmology;  Laterality: Left;  . COLONOSCOPY  2007   [name]  . CORONARY ANGIOPLASTY    . CORONARY ARTERY BYPASS GRAFT  1/13   [facility], X4  . CORONARY STENT PLACEMENT  7/12  RCA with drug eluting stent---[name]  . EYE SURGERY    . INSERT / REPLACE / REMOVE PACEMAKER    . PACEMAKER INSERTION  5/13   [facility]    Family History  Problem Relation Age of Onset  . Heart disease Mother        cad  . Hypertension Mother   . Heart disease Sister        half sister MI  . Cancer Sister        BRAIN  . Heart disease Father        heart attack  . Heart disease Brother        cabg  . Heart disease Brother   . Cancer Brother        BLADDER  . Cancer Sister 93       OVARIAN    Social History:  reports that she quit smoking about 55 years ago. Her smoking use included cigarettes. She has a 3.00  pack-year smoking history. She has never used smokeless tobacco. She reports that she does not drink alcohol or use drugs.  She smoked when she was young (quit in 1964).  She lives alone in [place].  Her son lives in [place], [place]. The patient is accompanied by her son (Joan Hull) and sister Joan Hull) today.  Allergies:  Allergies  Allergen Reactions  . Simvastatin Other (See Comments)    SORE JOINTS  . Aspirin Other (See Comments)    Bladder spasm  . Diazepam     REACTION: worsened bladder problems  . Hydrochlorothiazide Other (See Comments)  . Chlorhexidine Rash    Skin reacts only to scrub    Current Medications: Current Outpatient Medications  Medication Sig Dispense Refill  . acyclovir (ZOVIRAX) 400 MG tablet Take 400 mg by mouth continuous as needed.    Marland Kitchen allopurinol (ZYLOPRIM) 300 MG tablet Take 1 tablet (300 mg total) by mouth daily. 90 tablet 0  . atenolol (TENORMIN) 25 MG tablet Take 25 mg 2 (two) times daily by mouth. 50--AM 25--PM    . Biotin 10 MG CAPS Take by mouth.    . cholecalciferol (VITAMIN D) 1000 units tablet Take 1,000 Units by mouth daily.    Marland Kitchen diltiazem (CARDIZEM CD) 180 MG 24 hr capsule Take 180 mg by mouth daily.   5  . ELIQUIS 2.5 MG TABS tablet Take 2.5 mg by mouth 2 (two) times daily.     Marland Kitchen ELIQUIS 2.5 MG TABS tablet TAKE 1 TABLET BY MOUTH TWICE DAILY 180 tablet 3  . famotidine (PEPCID) 20 MG tablet Take 20 mg by mouth daily.    . Multiple Vitamins-Minerals (CENTRUM SILVER 50+WOMEN PO) Take 1 tablet by mouth.    Marland Kitchen PARoxetine (PAXIL) 10 MG tablet TAKE ONE TABLET BY MOUTH ONCE DAILY 90 tablet 3  . Probiotic Product (PROBIOTIC DAILY PO) Take 1 capsule by mouth daily.     Marland Kitchen torsemide (DEMADEX) 20 MG tablet TAKE 1/2 (ONE-HALF) TABLET BY MOUTH ONCE DAILY 45 tablet 1  . vitamin B-12 (CYANOCOBALAMIN) 100 MCG tablet Take 100 mcg by mouth daily.    Marland Kitchen zolpidem (AMBIEN) 5 MG tablet TAKE 1 TABLET BY MOUTH AT BEDTIME 90 tablet 0   No current  facility-administered medications for this visit.     Review of Systems  Constitutional: Negative for diaphoresis, fever, malaise/fatigue (energy improved) and weight loss (weight up 1 pound).       "I'm feeling a little bit better. I am not as short winded, and  my energy is a little bit better".   HENT: Negative.   Eyes: Negative.   Respiratory: Positive for shortness of breath (exertional). Negative for cough, hemoptysis and sputum production.   Cardiovascular: Positive for palpitations (PMH (+) for atrial fibrillation). Negative for chest pain, orthopnea, leg swelling and PND.  Gastrointestinal: Negative for abdominal pain, blood in stool, constipation, diarrhea, melena, nausea and vomiting.  Genitourinary: Negative for dysuria, frequency, hematuria and urgency.  Musculoskeletal: Positive for joint pain (osteoarthritis). Negative for back pain, falls and myalgias.  Skin: Negative for itching and rash.       Cyst (stable) and black nevus to posterior torso. LEFT inguinal LAD.   Neurological: Negative for dizziness, tremors, weakness and headaches.  Endo/Heme/Allergies: Does not bruise/bleed easily.  Psychiatric/Behavioral: Negative for depression, memory loss and suicidal ideas. The patient is not nervous/anxious and does not have insomnia.   All other systems reviewed and are negative.  Performance status (ECOG): 1 - Symptomatic but completely ambulatory  Vital Signs BP (!) 152/73 (BP Location: Left Arm, Patient Position: Sitting)   Pulse (!) 59   Temp (!) 97.1 F (36.2 C) (Tympanic)   Resp 18   Wt 104 lb 5 oz (47.3 kg)   BMI 21.80 kg/m   Physical Exam  Constitutional: She is oriented to person, place, and time and well-developed, well-nourished, and in no distress.  HENT:  Head: Normocephalic and atraumatic.  Brown styled hair.  Eyes: Pupils are equal, round, and reactive to light. EOM are normal. No scleral icterus.  Glasses. Blue eyes. Previous cataract surgery.  Neck:  Normal range of motion. Neck supple. No tracheal deviation present. No thyromegaly present.  Cardiovascular: Normal rate and normal heart sounds. An irregularly irregular rhythm present. Exam reveals no gallop and no friction rub.  No murmur heard. Implanted pacemaker  Pulmonary/Chest: Effort normal and breath sounds normal. No respiratory distress. She has no wheezes. She has no rales.  Abdominal: Soft. Bowel sounds are normal. She exhibits no distension. There is no tenderness.  Musculoskeletal: Normal range of motion. She exhibits no edema or tenderness.  Arthritic changes to BILATERAL hands.  Lymphadenopathy:    She has no cervical adenopathy.    She has no axillary adenopathy.       Right: No inguinal and no supraclavicular adenopathy present.       Left: Inguinal (pea sized nodule) adenopathy present. No supraclavicular adenopathy present.  Neurological: She is alert and oriented to person, place, and time.  Skin: Skin is warm and dry. Lesion (black lesion to superior aspect of LEFT posterior torso) noted. No rash noted. No erythema.  Left paraspinal cyst.  Psychiatric: Mood, affect and judgment normal.  Nursing note and vitals reviewed.   Appointment on 03/22/2018  Component Date Value Ref Range Status  . Uric Acid, Serum 03/22/2018 2.7  2.5 - 7.1 mg/dL Final   Performed at [facility], [address] [place], [place] [address]  . LDH 03/22/2018 152  98 - 192 U/L Final   Performed at [facility], [address] [place], [place] [address]  . Sodium 03/22/2018 140  135 - 145 mmol/L Final  . Potassium 03/22/2018 4.9  3.5 - 5.1 mmol/L Final  . Chloride 03/22/2018 101  98 - 111 mmol/L Final  . CO2 03/22/2018 29  22 - 32 mmol/L Final  . Glucose, Bld 03/22/2018 78  70 - 99 mg/dL Final  . BUN 03/22/2018 33* 8 - 23 mg/dL Final  . Creatinine, Ser 03/22/2018 1.18*  0.44 - 1.00 mg/dL Final  . Calcium 03/22/2018 10.1  8.9 - 10.3 mg/dL Final  . Total Protein 03/22/2018  7.2  6.5 - 8.1 g/dL Final  . Albumin 03/22/2018 4.4  3.5 - 5.0 g/dL Final  . AST 03/22/2018 23  15 - 41 U/L Final  . ALT 03/22/2018 13  0 - 44 U/L Final  . Alkaline Phosphatase 03/22/2018 116  38 - 126 U/L Final  . Total Bilirubin 03/22/2018 0.9  0.3 - 1.2 mg/dL Final  . GFR calc non Af Amer 03/22/2018 40* >60 mL/min Final  . GFR calc Af Amer 03/22/2018 46* >60 mL/min Final   Comment: (NOTE) The eGFR has been calculated using the CKD EPI equation. This calculation has not been validated in all clinical situations. eGFR's persistently <60 mL/min signify possible Chronic Kidney Disease.   Georgiann Hahn gap 03/22/2018 10  5 - 15 Final   Performed at [facility], [address] [place], [place] [address]  . WBC 03/22/2018 5.2  3.6 - 11.0 K/uL Final  . RBC 03/22/2018 4.15  3.80 - 5.20 MIL/uL Final  . Hemoglobin 03/22/2018 13.9  12.0 - 16.0 g/dL Final  . HCT 03/22/2018 40.7  35.0 - 47.0 % Final  . MCV 03/22/2018 97.9  80.0 - 100.0 fL Final  . MCH 03/22/2018 33.5  26.0 - 34.0 pg Final  . MCHC 03/22/2018 34.2  32.0 - 36.0 g/dL Final  . RDW 03/22/2018 14.3  11.5 - 14.5 % Final  . Platelets 03/22/2018 213  150 - 440 K/uL Final  . Neutrophils Relative % 03/22/2018 50  % Final  . Neutro Abs 03/22/2018 2.6  1.4 - 6.5 K/uL Final  . Lymphocytes Relative 03/22/2018 24  % Final  . Lymphs Abs 03/22/2018 1.3  1.0 - 3.6 K/uL Final  . Monocytes Relative 03/22/2018 23  % Final  . Monocytes Absolute 03/22/2018 1.2* 0.2 - 0.9 K/uL Final  . Eosinophils Relative 03/22/2018 2  % Final  . Eosinophils Absolute 03/22/2018 0.1  0 - 0.7 K/uL Final  . Basophils Relative 03/22/2018 1  % Final  . Basophils Absolute 03/22/2018 0.0  0 - 0.1 K/uL Final   Performed at [facility], [address] [place], [place] [address]    Assessment:  ANAHITA CUA is a 84 y.o. female with a stage III marginal zone lymphoma.  She presented with a left axillary mass.  Biopsy on 01/15/2016 revealed an atypical  lymphoid proliferation highly suspicious for non-Hodgkin's lymphoma.  There were diffuse sheets of B-cells (CD20 and CD79a +; CD5 and CD10-).  In addition, there was CD138 + abundant plasmacytoid cells with lambda light chain restriction. The lambda staining was apparently present in lymphoid cells.   Lymph node excisional biopsy on 02/06/2016 revealed mature B-cell lymphoma with plasmacytic differentiation, best classified as a marginal zone lymphoma.  PET scan on 01/31/2016 revealed active lymphoma within the chest, abdomen, and pelvis.  The largest lymph node was 1.7 cm.  Hepatitis B core antibody IgM, hepatitis B surface antigen, and hepatitis C antibody were negative on 01/21/2016.  Hepatitis B core antibody was negative on 02/22/2016.  G6PD assay was negative on 02/22/2016.  She developed acute renal insufficiency after initiation of Septra.  Renal ultrasound on 03/04/2016 revealed no evidence of hydronephrosis.  Baseline creatinine is 0.91 - 1.13 (CrCl 44 - 54 ml/min).  Creatinine was 1.54 (CrCl 29 ml/min) on 03/04/2016 and improved to baseline off Septra.  She received weekly Rituxan x 4 (03/11/2016 -  04/01/2016).  She tolerated her infusions well.  PET scan on 05/01/2016 revealed significant partial metabolic response. There was residual mildly hypermetabolic left axillary and mediastinal lymphoma, decreased in size and metabolism. Additional previously described sites of hypermetabolic lymphoma had resolved.  PET scan on 02/20/2017 revealed no evidence for residual hypermetabolic disease in the neck, chest, abdomen, or pelvis.  PET scan on 02/15/2018 revealed a new enlarged and hypermetabolic left inguinal lymph node concerning for recurrent tumor (6 mm with SUV 1.77 to 14 mm with SUV 7.33).  The perifissural nodule in the right lung measured 7 mm (previously 5 mm).  She is s/p 7 cycles maintenance Rituxan (06/03/2016 - 11/19/2017).  Symptomatically, patient is doing well. She denies acute  concerns. Her breathing has improved, as has her energy level. She denies any B symptoms or interval infections. Exam is stable.    Plan: 1. Labs today: CBC with differential, CMP, LDH, uric acid 2. Marginal zone lymphoma:  Review recent PET imaging concerning for recurrent tumor.  Area of hypermetabolism noted in the LEFT inguinal area.    Patient has tiny palpable adenopathy in the LEFT inguinal area that corresponds to a hypermetabolic area on PET scan.  Review indications for treatment, including development of the symptoms, bulky and disfiguring adenopathy, and involvement of other organ systems.  Patient has no current indications for treatment.  Continue to observe.  LDH and  uric acid levels stable. Continue daily allopurinol dose for TLS prophylaxis. 3. Questionable melanotic lesion to posterior torso - has not been evaluated  To date, patient has not been seen in consult by dermatology. Encouraged her to follow up with her dermatologist for evaluation of potentially melanotic lesion to her back.  4. RTC in 3 months for MD assessment and labs (CBC with diff, CMP, LDH, uric acid).   Honor Loh, NP  03/22/2018, 12:20 PM   I saw and evaluated the patient, participating in the key portions of the service and reviewing pertinent diagnostic studies and records.  I reviewed the nurse practitioner's note and agree with the findings and the plan.  The assessment and plan were discussed with the patient.  Multiple questions were asked by the patient and answered.   Nolon Stalls, MD 03/22/2018,12:20 PM

## 2018-03-22 ENCOUNTER — Inpatient Hospital Stay: Payer: PPO | Attending: Hematology and Oncology

## 2018-03-22 ENCOUNTER — Inpatient Hospital Stay (HOSPITAL_BASED_OUTPATIENT_CLINIC_OR_DEPARTMENT_OTHER): Payer: PPO | Admitting: Hematology and Oncology

## 2018-03-22 VITALS — BP 152/73 | HR 59 | Temp 97.1°F | Resp 18 | Wt 104.3 lb

## 2018-03-22 DIAGNOSIS — Z9221 Personal history of antineoplastic chemotherapy: Secondary | ICD-10-CM | POA: Insufficient documentation

## 2018-03-22 DIAGNOSIS — Z87891 Personal history of nicotine dependence: Secondary | ICD-10-CM | POA: Insufficient documentation

## 2018-03-22 DIAGNOSIS — Z8572 Personal history of non-Hodgkin lymphomas: Secondary | ICD-10-CM

## 2018-03-22 DIAGNOSIS — D225 Melanocytic nevi of trunk: Secondary | ICD-10-CM | POA: Insufficient documentation

## 2018-03-22 DIAGNOSIS — R590 Localized enlarged lymph nodes: Secondary | ICD-10-CM | POA: Insufficient documentation

## 2018-03-22 DIAGNOSIS — C8580 Other specified types of non-Hodgkin lymphoma, unspecified site: Secondary | ICD-10-CM

## 2018-03-22 DIAGNOSIS — I10 Essential (primary) hypertension: Secondary | ICD-10-CM

## 2018-03-22 DIAGNOSIS — L989 Disorder of the skin and subcutaneous tissue, unspecified: Secondary | ICD-10-CM

## 2018-03-22 LAB — COMPREHENSIVE METABOLIC PANEL
ALT: 13 U/L (ref 0–44)
AST: 23 U/L (ref 15–41)
Albumin: 4.4 g/dL (ref 3.5–5.0)
Alkaline Phosphatase: 116 U/L (ref 38–126)
Anion gap: 10 (ref 5–15)
BUN: 33 mg/dL — ABNORMAL HIGH (ref 8–23)
CO2: 29 mmol/L (ref 22–32)
Calcium: 10.1 mg/dL (ref 8.9–10.3)
Chloride: 101 mmol/L (ref 98–111)
Creatinine, Ser: 1.18 mg/dL — ABNORMAL HIGH (ref 0.44–1.00)
GFR calc Af Amer: 46 mL/min — ABNORMAL LOW (ref >60)
GFR calc non Af Amer: 40 mL/min — ABNORMAL LOW (ref >60)
Glucose, Bld: 78 mg/dL (ref 70–99)
Potassium: 4.9 mmol/L (ref 3.5–5.1)
Sodium: 140 mmol/L (ref 135–145)
Total Bilirubin: 0.9 mg/dL (ref 0.3–1.2)
Total Protein: 7.2 g/dL (ref 6.5–8.1)

## 2018-03-22 LAB — CBC WITH DIFFERENTIAL/PLATELET
Basophils Absolute: 0 10*3/uL (ref 0–0.1)
Basophils Relative: 1 %
Eosinophils Absolute: 0.1 10*3/uL (ref 0–0.7)
Eosinophils Relative: 2 %
HCT: 40.7 % (ref 35.0–47.0)
Hemoglobin: 13.9 g/dL (ref 12.0–16.0)
Lymphocytes Relative: 24 %
Lymphs Abs: 1.3 10*3/uL (ref 1.0–3.6)
MCH: 33.5 pg (ref 26.0–34.0)
MCHC: 34.2 g/dL (ref 32.0–36.0)
MCV: 97.9 fL (ref 80.0–100.0)
Monocytes Absolute: 1.2 10*3/uL — ABNORMAL HIGH (ref 0.2–0.9)
Monocytes Relative: 23 %
Neutro Abs: 2.6 10*3/uL (ref 1.4–6.5)
Neutrophils Relative %: 50 %
Platelets: 213 10*3/uL (ref 150–440)
RBC: 4.15 MIL/uL (ref 3.80–5.20)
RDW: 14.3 % (ref 11.5–14.5)
WBC: 5.2 10*3/uL (ref 3.6–11.0)

## 2018-03-22 LAB — URIC ACID: Uric Acid, Serum: 2.7 mg/dL (ref 2.5–7.1)

## 2018-03-22 LAB — LACTATE DEHYDROGENASE: LDH: 152 U/L (ref 98–192)

## 2018-03-22 NOTE — Progress Notes (Signed)
Patient has increased her Torsemide to one who tablet.  States she feels bloated after eating.  Has to take ducolax to keep her bowels moving. Otherwise, offers no complaints.

## 2018-04-07 DIAGNOSIS — D171 Benign lipomatous neoplasm of skin and subcutaneous tissue of trunk: Secondary | ICD-10-CM | POA: Diagnosis not present

## 2018-04-07 DIAGNOSIS — I8310 Varicose veins of unspecified lower extremity with inflammation: Secondary | ICD-10-CM | POA: Diagnosis not present

## 2018-04-07 DIAGNOSIS — D692 Other nonthrombocytopenic purpura: Secondary | ICD-10-CM | POA: Diagnosis not present

## 2018-04-07 DIAGNOSIS — L821 Other seborrheic keratosis: Secondary | ICD-10-CM | POA: Diagnosis not present

## 2018-04-07 DIAGNOSIS — L720 Epidermal cyst: Secondary | ICD-10-CM | POA: Diagnosis not present

## 2018-04-25 ENCOUNTER — Encounter: Payer: Self-pay | Admitting: Hematology and Oncology

## 2018-05-25 ENCOUNTER — Other Ambulatory Visit: Payer: Self-pay | Admitting: Hematology and Oncology

## 2018-05-28 ENCOUNTER — Other Ambulatory Visit: Payer: Self-pay | Admitting: Internal Medicine

## 2018-05-31 NOTE — Telephone Encounter (Signed)
Last office visit 09/08/17 for Acute Back Pain.  No future appointments.  No UDS/Contract.   Last refilled 03/02/2018 for #90 with no refills.   Ok to refill?

## 2018-05-31 NOTE — Telephone Encounter (Signed)
Pt called to check status of this refill. Pt states she has one tablet left.

## 2018-06-01 NOTE — Telephone Encounter (Signed)
plz notify this was sent in. 

## 2018-06-01 NOTE — Telephone Encounter (Signed)
I spoke with pt and she is concerned she is going to run out of med; pt said she put the call in for the refill on 05/28/18 and pt would be very appreciative to have zolpidem filled today before she runs out of med tonight. Pt request cb when med sent to pharmacy and if no answer please leave detailed message.

## 2018-06-01 NOTE — Telephone Encounter (Signed)
Dr [name] and Avie Echevaria NP out of office.Please advise.

## 2018-06-01 NOTE — Telephone Encounter (Signed)
Patient would like a nurse to call her back [phone]

## 2018-06-02 NOTE — Telephone Encounter (Signed)
Patient notified by telephone that script has been sent to the pharmacy. 

## 2018-06-21 ENCOUNTER — Telehealth: Payer: Self-pay | Admitting: Internal Medicine

## 2018-06-21 ENCOUNTER — Encounter: Payer: Self-pay | Admitting: Internal Medicine

## 2018-06-21 ENCOUNTER — Ambulatory Visit (INDEPENDENT_AMBULATORY_CARE_PROVIDER_SITE_OTHER): Payer: PPO | Admitting: Internal Medicine

## 2018-06-21 VITALS — BP 132/80 | HR 61 | Temp 97.7°F | Ht <= 58 in | Wt 104.0 lb

## 2018-06-21 DIAGNOSIS — S80922A Unspecified superficial injury of left lower leg, initial encounter: Secondary | ICD-10-CM | POA: Diagnosis not present

## 2018-06-21 MED ORDER — SILVER SULFADIAZINE 1 % EX CREA: 1.0000 "application " | TOPICAL_CREAM | Freq: Every day | CUTANEOUS | 0 refills | Status: AC

## 2018-06-21 NOTE — Telephone Encounter (Signed)
I spoke with pt; pt cut her leg on wicker basket;approx 3" cut on 06/18/18. Still oozing clear drainage; pt not sure if any blood in drainage now or not. The area around cut is pink in color; pts leg is swollen; pt taking eliquis. Leg is sore when walking. Pt is concerned about infection. Pt to see Dr [name] 06/21/18 at 12:30. FYI to Dr [name].

## 2018-06-21 NOTE — Telephone Encounter (Signed)
Seen today and wound evaluated (no infection)

## 2018-06-21 NOTE — Telephone Encounter (Signed)
Pt called wanting to get an appointment she stated she cut her left leg on wicker basket Friday.  She stated it is seeping and is very sore when she walks.  Pt stated her son wrapped her leg up .  Pt stated she is on eliquis   I spoke with Rena she advise me to tell pt to go to urgent care.  Pt wanted to talk to a triage nurse

## 2018-06-21 NOTE — Assessment & Plan Note (Signed)
Skin tear without infection No skin that appears devitalized at this point Dressed with silvadene/non stick dressing and coban Discussed daily care Discussed signs of secondary infection

## 2018-06-21 NOTE — Progress Notes (Signed)
Subjective:    Patient ID: Joan Hull, female    DOB: [birth year], 84 y.o.   MRN: [id number]  HPI Here due to injury to leg and possible infection  Dropped something off her counter--3 days ago Hit left calf on wicker basket handle Skin tore it back Bleeding bad at first---then settled down   It was wrapped well---and she hadn't checked it Worried about infection  Current Outpatient Medications on File Prior to Visit  Medication Sig Dispense Refill  . acyclovir (ZOVIRAX) 400 MG tablet Take 400 mg by mouth continuous as needed.    Marland Kitchen allopurinol (ZYLOPRIM) 300 MG tablet TAKE 1 TABLET BY MOUTH ONCE DAILY 90 tablet 0  . atenolol (TENORMIN) 25 MG tablet Take 25 mg 2 (two) times daily by mouth. 50--AM 25--PM    . Biotin 10 MG CAPS Take by mouth.    . diltiazem (CARDIZEM CD) 180 MG 24 hr capsule Take 180 mg by mouth daily.   5  . ELIQUIS 2.5 MG TABS tablet TAKE 1 TABLET BY MOUTH TWICE DAILY 180 tablet 3  . famotidine (PEPCID) 20 MG tablet Take 20 mg by mouth daily.    . Multiple Vitamins-Minerals (CENTRUM SILVER 50+WOMEN PO) Take 1 tablet by mouth.    Marland Kitchen PARoxetine (PAXIL) 10 MG tablet TAKE ONE TABLET BY MOUTH ONCE DAILY 90 tablet 3  . Probiotic Product (PROBIOTIC DAILY PO) Take 1 capsule by mouth daily.     Marland Kitchen torsemide (DEMADEX) 20 MG tablet TAKE 1/2 (ONE-HALF) TABLET BY MOUTH ONCE DAILY 45 tablet 1  . vitamin B-12 (CYANOCOBALAMIN) 100 MCG tablet Take 100 mcg by mouth daily.    Marland Kitchen zolpidem (AMBIEN) 5 MG tablet TAKE 1 TABLET BY MOUTH AT BEDTIME 90 tablet 0   No current facility-administered medications on file prior to visit.     Allergies  Allergen Reactions  . Simvastatin Other (See Comments)    SORE JOINTS  . Aspirin Other (See Comments)    Bladder spasm  . Diazepam     REACTION: worsened bladder problems  . Hydrochlorothiazide Other (See Comments)  . Chlorhexidine Rash    Skin reacts only to scrub    Past Medical History:  Diagnosis Date  . Anxiety   . Arthritis     fingers  . Atrial fibrillation ([place])   . CAD (coronary artery disease)   . CAD (coronary artery disease)   . Cancer ([place]) 2017   non-hodgkins lymphoma  . Depression   . Dysrhythmia    A-Fib  . GERD (gastroesophageal reflux disease)   . H/O Clostridium difficile infection   . Heart murmur   . History of immunotherapy 02/20/2017  . Hx of colonic polyps   . Hyperlipidemia   . Hypertension   . Interstitial cystitis   . Non Q wave myocardial infarction ([place]) 06/27/11   [facility]  . Osteoporosis   . Pacemaker   . Presence of permanent cardiac pacemaker   . Shortness of breath dyspnea    doe    Past Surgical History:  Procedure Laterality Date  . AXILLARY LYMPH NODE BIOPSY Left 02/06/2016   Procedure: AXILLARY LYMPH NODE BIOPSY;  Surgeon: [name], MD;  Location: [facility];  Service: General;  Laterality: Left;  . CARDIAC CATHETERIZATION    . CATARACT EXTRACTION Right 2005  . CATARACT EXTRACTION W/PHACO Left 12/28/2017   Procedure: CATARACT EXTRACTION PHACO AND INTRAOCULAR LENS PLACEMENT ([place]) LEFT;  Surgeon: Leandrew Koyanagi, MD;  Location: [place];  Service: Ophthalmology;  Laterality: Left;  . COLONOSCOPY  2007   [name]  . CORONARY ANGIOPLASTY    . CORONARY ARTERY BYPASS GRAFT  1/13   [facility], X4  . CORONARY STENT PLACEMENT  7/12   RCA with drug eluting stent---[name]  . EYE SURGERY    . INSERT / REPLACE / REMOVE PACEMAKER    . PACEMAKER INSERTION  5/13   [facility]    Family History  Problem Relation Age of Onset  . Heart disease Mother        cad  . Hypertension Mother   . Heart disease Sister        half sister MI  . Cancer Sister        BRAIN  . Heart disease Father        heart attack  . Heart disease Brother        cabg  . Heart disease Brother   . Cancer Brother        BLADDER  . Cancer Sister 33       OVARIAN    Social History   Socioeconomic History  . Marital status: Widowed    Spouse name: Not on file  . Number of  children: 1  . Years of education: Not on file  . Highest education level: Not on file  Occupational History  . 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 Financial resource strain: Not on file  . Food insecurity:    Worry: Not on file    Inability: Not on file  . Transportation needs:    Medical: Not on file    Non-medical: Not on file  Tobacco Use  . Smoking status: Former Smoker    Packs/day: 1.00    Years: 3.00    Pack years: 3.00    Types: Cigarettes    Last attempt to quit: 08/18/1962    Years since quitting: 55.8  . Smokeless tobacco: Never Used  Substance and Sexual Activity  . Alcohol use: No    Alcohol/week: 0.0 standard drinks  . Drug use: No  . Sexual activity: Not on file  Lifestyle  . Physical activity:    Days per week: Not on file    Minutes per session: Not on file  . Stress: Not on file  Relationships  . Social connections:    Talks on phone: Not on file    Gets together: Not on file    Attends religious service: Not on file    Active member of club or organization: Not on file    Attends meetings of clubs or organizations: Not on file    Relationship status: Not on file  . Intimate partner violence:    Fear of current or ex partner: Not on file    Emotionally abused: Not on file    Physically abused: Not on file    Forced sexual activity: Not on file  Other Topics Concern  . Not on file  Social History Narrative   Now has living will   Son [name] is her health care POA. Alternate is sister Joan Hull.   Would accept resuscitation attempts--but no prolonged ventilation   No tube feeds if cognitively unaware   Review of Systems No fever No N/V Doesn't feel sick    Objective:   Physical Exam  Constitutional: No distress.  Skin:  Large vertical skin tear---- at least 10cm long and as wide as 4cm Bluish discoloration medially but all skin appears viable No  redness or signs of infection           Assessment &  Plan:

## 2018-06-22 ENCOUNTER — Inpatient Hospital Stay: Payer: PPO | Admitting: Hematology and Oncology

## 2018-06-22 ENCOUNTER — Inpatient Hospital Stay: Payer: PPO | Attending: Hematology and Oncology

## 2018-06-22 VITALS — BP 106/63 | HR 63 | Temp 96.5°F | Resp 18 | Wt 104.5 lb

## 2018-06-22 DIAGNOSIS — X58XXXA Exposure to other specified factors, initial encounter: Secondary | ICD-10-CM | POA: Diagnosis not present

## 2018-06-22 DIAGNOSIS — Z9221 Personal history of antineoplastic chemotherapy: Secondary | ICD-10-CM

## 2018-06-22 DIAGNOSIS — R222 Localized swelling, mass and lump, trunk: Secondary | ICD-10-CM

## 2018-06-22 DIAGNOSIS — L988 Other specified disorders of the skin and subcutaneous tissue: Secondary | ICD-10-CM

## 2018-06-22 DIAGNOSIS — Z8572 Personal history of non-Hodgkin lymphomas: Secondary | ICD-10-CM | POA: Insufficient documentation

## 2018-06-22 DIAGNOSIS — S81802A Unspecified open wound, left lower leg, initial encounter: Secondary | ICD-10-CM | POA: Diagnosis not present

## 2018-06-22 DIAGNOSIS — I10 Essential (primary) hypertension: Secondary | ICD-10-CM | POA: Diagnosis not present

## 2018-06-22 DIAGNOSIS — C8580 Other specified types of non-Hodgkin lymphoma, unspecified site: Secondary | ICD-10-CM

## 2018-06-22 DIAGNOSIS — Z87891 Personal history of nicotine dependence: Secondary | ICD-10-CM

## 2018-06-22 LAB — CBC WITH DIFFERENTIAL/PLATELET
Abs Immature Granulocytes: 0.05 10*3/uL (ref 0.00–0.07)
Basophils Absolute: 0 10*3/uL (ref 0.0–0.1)
Basophils Relative: 0 %
Eosinophils Absolute: 0 10*3/uL (ref 0.0–0.5)
Eosinophils Relative: 0 %
HCT: 36.1 % (ref 36.0–46.0)
Hemoglobin: 11.8 g/dL — ABNORMAL LOW (ref 12.0–15.0)
Immature Granulocytes: 1 %
Lymphocytes Relative: 10 %
Lymphs Abs: 0.9 10*3/uL (ref 0.7–4.0)
MCH: 32.6 pg (ref 26.0–34.0)
MCHC: 32.7 g/dL (ref 30.0–36.0)
MCV: 99.7 fL (ref 80.0–100.0)
Monocytes Absolute: 1.7 10*3/uL — ABNORMAL HIGH (ref 0.1–1.0)
Monocytes Relative: 18 %
Neutro Abs: 6.4 10*3/uL (ref 1.7–7.7)
Neutrophils Relative %: 71 %
Platelets: 236 10*3/uL (ref 150–400)
RBC: 3.62 MIL/uL — ABNORMAL LOW (ref 3.87–5.11)
RDW: 14.2 % (ref 11.5–15.5)
WBC: 9.1 10*3/uL (ref 4.0–10.5)
nRBC: 0 % (ref 0.0–0.2)

## 2018-06-22 LAB — COMPREHENSIVE METABOLIC PANEL
ALT: 16 U/L (ref 0–44)
AST: 24 U/L (ref 15–41)
Albumin: 4.3 g/dL (ref 3.5–5.0)
Alkaline Phosphatase: 102 U/L (ref 38–126)
Anion gap: 11 (ref 5–15)
BUN: 28 mg/dL — ABNORMAL HIGH (ref 8–23)
CO2: 23 mmol/L (ref 22–32)
Calcium: 9.6 mg/dL (ref 8.9–10.3)
Chloride: 103 mmol/L (ref 98–111)
Creatinine, Ser: 1.05 mg/dL — ABNORMAL HIGH (ref 0.44–1.00)
GFR calc Af Amer: 53 mL/min — ABNORMAL LOW (ref >60)
GFR calc non Af Amer: 45 mL/min — ABNORMAL LOW (ref >60)
Glucose, Bld: 150 mg/dL — ABNORMAL HIGH (ref 70–99)
Potassium: 3.4 mmol/L — ABNORMAL LOW (ref 3.5–5.1)
Sodium: 137 mmol/L (ref 135–145)
Total Bilirubin: 1.1 mg/dL (ref 0.3–1.2)
Total Protein: 7.1 g/dL (ref 6.5–8.1)

## 2018-06-22 LAB — LACTATE DEHYDROGENASE: LDH: 152 U/L (ref 98–192)

## 2018-06-22 LAB — URIC ACID: Uric Acid, Serum: 2.7 mg/dL (ref 2.5–7.1)

## 2018-06-22 NOTE — Progress Notes (Signed)
[facility] day:  06/22/2018    Chief Complaint: Joan Hull is a 84 y.o. female with stage III marginal zone lymphoma s/p Rituxan who is seen for a 3 month assessment.   HPI:  The patient was last seen in the medical oncology clinic on 03/22/2018.  At that time, she was doing well. She denied acute concerns. Her breathing had improved, as had her energy level. She denied any B symptoms or interval infections. Exam was stable.    She was encouraged to follow-up with dermatology regarding a lesion on her back.  During the interim, patient is doing well overall. She has a wound to her LEFT lower leg resulting from blunt trauma from a wicker basket. Patient seen by PCP yesterday for the same. No signs of infection. She is using SSD cream as ordered by Dr. [name].  Patient denies that she has experienced any B symptoms. She denies any interval infections. She has not appreciated any new areas of palpable adenopathy. Previous area of concern to her thoracic spine has been evaluated by dermatology. Cystic area noted to be abutting the LEFT lateral border of a vertebral body in the patient's thoracolumbar spine. Additionally, she has a small nevus to the top of back that is black in color. She advises that she has been seen in consult by dermatology for this area of concern.   Patient advises that she maintains an adequate appetite. She is eating well. Weight today is 104 lb 8 oz (47.4 kg), which compared to her last visit to the clinic, represents a stable weight.   Patient denies pain in the clinic today.   Past Medical History:  Diagnosis Date  . Anxiety   . Arthritis    fingers  . Atrial fibrillation ([place])   . CAD (coronary artery disease)   . CAD (coronary artery disease)   . Cancer ([place]) 2017   non-hodgkins lymphoma  . Depression   . Dysrhythmia    A-Fib  . GERD (gastroesophageal reflux disease)   . H/O Clostridium difficile infection    . Heart murmur   . History of immunotherapy 02/20/2017  . Hx of colonic polyps   . Hyperlipidemia   . Hypertension   . Interstitial cystitis   . Non Q wave myocardial infarction ([place]) 06/27/11   [facility]  . Osteoporosis   . Pacemaker   . Presence of permanent cardiac pacemaker   . Shortness of breath dyspnea    doe    Past Surgical History:  Procedure Laterality Date  . AXILLARY LYMPH NODE BIOPSY Left 02/06/2016   Procedure: AXILLARY LYMPH NODE BIOPSY;  Surgeon: [name], MD;  Location: [facility] ORS;  Service: General;  Laterality: Left;  . CARDIAC CATHETERIZATION    . CATARACT EXTRACTION Right 2005  . CATARACT EXTRACTION W/PHACO Left 12/28/2017   Procedure: CATARACT EXTRACTION PHACO AND INTRAOCULAR LENS PLACEMENT ([place]) LEFT;  Surgeon: Leandrew Koyanagi, MD;  Location: [place] [facility];  Service: Ophthalmology;  Laterality: Left;  . COLONOSCOPY  2007   [name]  . CORONARY ANGIOPLASTY    . CORONARY ARTERY BYPASS GRAFT  1/13   [facility], X4  . CORONARY STENT PLACEMENT  7/12   RCA with drug eluting stent---[name]  . EYE SURGERY    . INSERT / REPLACE / REMOVE PACEMAKER    . PACEMAKER INSERTION  5/13   [facility]    Family History  Problem Relation Age of Onset  . Heart disease  Mother        cad  . Hypertension Mother   . Heart disease Sister        half sister MI  . Cancer Sister        BRAIN  . Heart disease Father        heart attack  . Heart disease Brother        cabg  . Heart disease Brother   . Cancer Brother        BLADDER  . Cancer Sister 42       OVARIAN    Social History:  reports that she quit smoking about 55 years ago. Her smoking use included cigarettes. She has a 3.00 pack-year smoking history. She has never used smokeless tobacco. She reports that she does not drink alcohol or use drugs.  She smoked when she was young (quit in 1964).  She lives alone in [place].  Her son (Joan Hull) lives in [place], [place]. Her sister, Joan Hull, often  accompanies her to appointments. The patient is alone today.   Allergies:  Allergies  Allergen Reactions  . Simvastatin Other (See Comments)    SORE JOINTS  . Aspirin Other (See Comments)    Bladder spasm  . Diazepam     REACTION: worsened bladder problems  . Hydrochlorothiazide Other (See Comments)  . Chlorhexidine Rash    Skin reacts only to scrub    Current Medications: Current Outpatient Medications  Medication Sig Dispense Refill  . acyclovir (ZOVIRAX) 400 MG tablet Take 400 mg by mouth continuous as needed.    Marland Kitchen allopurinol (ZYLOPRIM) 300 MG tablet TAKE 1 TABLET BY MOUTH ONCE DAILY 90 tablet 0  . atenolol (TENORMIN) 25 MG tablet Take 25 mg 2 (two) times daily by mouth. 50--AM 25--PM    . Biotin 10 MG CAPS Take by mouth.    . diltiazem (CARDIZEM CD) 180 MG 24 hr capsule Take 180 mg by mouth daily.   5  . ELIQUIS 2.5 MG TABS tablet TAKE 1 TABLET BY MOUTH TWICE DAILY 180 tablet 3  . famotidine (PEPCID) 20 MG tablet Take 20 mg by mouth daily.    . Multiple Vitamins-Minerals (CENTRUM SILVER 50+WOMEN PO) Take 1 tablet by mouth.    Marland Kitchen PARoxetine (PAXIL) 10 MG tablet TAKE ONE TABLET BY MOUTH ONCE DAILY 90 tablet 3  . Probiotic Product (PROBIOTIC DAILY PO) Take 1 capsule by mouth daily.     . silver sulfADIAZINE (SILVADENE) 1 % cream Apply 1 application topically daily. 50 g 0  . torsemide (DEMADEX) 20 MG tablet TAKE 1/2 (ONE-HALF) TABLET BY MOUTH ONCE DAILY 45 tablet 1  . vitamin B-12 (CYANOCOBALAMIN) 100 MCG tablet Take 100 mcg by mouth daily.    Marland Kitchen zolpidem (AMBIEN) 5 MG tablet TAKE 1 TABLET BY MOUTH AT BEDTIME 90 tablet 0   No current facility-administered medications for this visit.     Review of Systems  Constitutional: Negative for diaphoresis, fever, malaise/fatigue and weight loss (stable).  HENT: Negative.   Eyes: Negative.   Respiratory: Positive for shortness of breath (exertional). Negative for cough, hemoptysis and sputum production.   Cardiovascular: Positive for  palpitations (A.fib). Negative for chest pain, orthopnea, leg swelling and PND.  Gastrointestinal: Negative for abdominal pain, blood in stool, constipation, diarrhea, melena, nausea and vomiting.  Genitourinary: Negative for dysuria, frequency, hematuria and urgency.  Musculoskeletal: Positive for joint pain (OA). Negative for back pain, falls and myalgias.  Skin: Negative for itching and rash.  Wound to LLE. Cystic growth to LEFT lateral spine. Nevus to top of back.   Neurological: Negative for dizziness, tremors, weakness and headaches.  Endo/Heme/Allergies: Does not bruise/bleed easily.  Psychiatric/Behavioral: Negative for depression, memory loss and suicidal ideas. The patient is not nervous/anxious and does not have insomnia.   All other systems reviewed and are negative.  Performance status (ECOG): 1 - Symptomatic but completely ambulatory  Vital Signs BP 106/63 (BP Location: Left Arm, Patient Position: Sitting)   Pulse 63   Temp (!) 96.5 F (35.8 C) (Tympanic)   Resp 18   Wt 104 lb 8 oz (47.4 kg)   BMI 22.61 kg/m   Physical Exam  Constitutional: She is oriented to person, place, and time and well-developed, well-nourished, and in no distress.  HENT:  Head: Normocephalic and atraumatic.  Mouth/Throat: Oropharynx is clear and moist and mucous membranes are normal. No oropharyngeal exudate.  Short brown hair.  Eyes: Pupils are equal, round, and reactive to light. Conjunctivae and EOM are normal. No scleral icterus.  Glasses.  Blue eyes.  Neck: Normal range of motion. Neck supple. No JVD present.  Cardiovascular: Normal rate, normal heart sounds and intact distal pulses. An irregularly irregular rhythm present. Exam reveals no gallop and no friction rub.  No murmur heard. Implanted pacemaker  Pulmonary/Chest: Effort normal and breath sounds normal. No respiratory distress. She has no wheezes. She has no rales.  Abdominal: Soft. Bowel sounds are normal. She exhibits no  distension and no mass. There is no abdominal tenderness. There is no rebound and no guarding.  Musculoskeletal: Normal range of motion.        General: No tenderness or edema.     Comments: Left lower extremity wrapped.  Lymphadenopathy:    She has no cervical adenopathy.    She has no axillary adenopathy.       Right: No inguinal and no supraclavicular adenopathy present.       Left: Inguinal (stable) adenopathy present. No supraclavicular adenopathy present.  Neurological: She is alert and oriented to person, place, and time.  Skin: Skin is warm and dry. No rash noted. No erythema.     Psychiatric: Mood, affect and judgment normal.  Nursing note and vitals reviewed.   Appointment on 06/22/2018  Component Date Value Ref Range Status  . LDH 06/22/2018 152  98 - 192 U/L Final   Performed at [facility], [address] [place], [place] [address]  . Sodium 06/22/2018 137  135 - 145 mmol/L Final  . Potassium 06/22/2018 3.4* 3.5 - 5.1 mmol/L Final  . Chloride 06/22/2018 103  98 - 111 mmol/L Final  . CO2 06/22/2018 23  22 - 32 mmol/L Final  . Glucose, Bld 06/22/2018 150* 70 - 99 mg/dL Final  . BUN 06/22/2018 28* 8 - 23 mg/dL Final  . Creatinine, Ser 06/22/2018 1.05* 0.44 - 1.00 mg/dL Final  . Calcium 06/22/2018 9.6  8.9 - 10.3 mg/dL Final  . Total Protein 06/22/2018 7.1  6.5 - 8.1 g/dL Final  . Albumin 06/22/2018 4.3  3.5 - 5.0 g/dL Final  . AST 06/22/2018 24  15 - 41 U/L Final  . ALT 06/22/2018 16  0 - 44 U/L Final  . Alkaline Phosphatase 06/22/2018 102  38 - 126 U/L Final  . Total Bilirubin 06/22/2018 1.1  0.3 - 1.2 mg/dL Final  . GFR calc non Af Amer 06/22/2018 45* >60 mL/min Final  . GFR calc Af Amer 06/22/2018 53* >60 mL/min Final  Comment: (NOTE) The eGFR has been calculated using the CKD EPI equation. This calculation has not been validated in all clinical situations. eGFR's persistently <60 mL/min signify possible Chronic Kidney Disease.   Georgiann Hahn gap  06/22/2018 11  5 - 15 Final   Performed at [facility], [address] [place], [place] [address]  . WBC 06/22/2018 9.1  4.0 - 10.5 K/uL Final  . RBC 06/22/2018 3.62* 3.87 - 5.11 MIL/uL Final  . Hemoglobin 06/22/2018 11.8* 12.0 - 15.0 g/dL Final  . HCT 06/22/2018 36.1  36.0 - 46.0 % Final  . MCV 06/22/2018 99.7  80.0 - 100.0 fL Final  . MCH 06/22/2018 32.6  26.0 - 34.0 pg Final  . MCHC 06/22/2018 32.7  30.0 - 36.0 g/dL Final  . RDW 06/22/2018 14.2  11.5 - 15.5 % Final  . Platelets 06/22/2018 236  150 - 400 K/uL Final  . nRBC 06/22/2018 0.0  0.0 - 0.2 % Final  . Neutrophils Relative % 06/22/2018 71  % Final  . Neutro Abs 06/22/2018 6.4  1.7 - 7.7 K/uL Final  . Lymphocytes Relative 06/22/2018 10  % Final  . Lymphs Abs 06/22/2018 0.9  0.7 - 4.0 K/uL Final  . Monocytes Relative 06/22/2018 18  % Final  . Monocytes Absolute 06/22/2018 1.7* 0.1 - 1.0 K/uL Final  . Eosinophils Relative 06/22/2018 0  % Final  . Eosinophils Absolute 06/22/2018 0.0  0.0 - 0.5 K/uL Final  . Basophils Relative 06/22/2018 0  % Final  . Basophils Absolute 06/22/2018 0.0  0.0 - 0.1 K/uL Final  . Immature Granulocytes 06/22/2018 1  % Final  . Abs Immature Granulocytes 06/22/2018 0.05  0.00 - 0.07 K/uL Final   Performed at [facility], [address] [place], [place] [address]    Assessment:  BLAKE GOYA is a 84 y.o. female with a stage III marginal zone lymphoma.  She presented with a left axillary mass.  Biopsy on 01/15/2016 revealed an atypical lymphoid proliferation highly suspicious for non-Hodgkin's lymphoma.  There were diffuse sheets of B-cells (CD20 and CD79a +; CD5 and CD10-).  In addition, there was CD138 + abundant plasmacytoid cells with lambda light chain restriction. The lambda staining was apparently present in lymphoid cells.   Lymph node excisional biopsy on 02/06/2016 revealed mature B-cell lymphoma with plasmacytic differentiation, best classified as a marginal zone  lymphoma.  PET scan on 01/31/2016 revealed active lymphoma within the chest, abdomen, and pelvis.  The largest lymph node was 1.7 cm.  Hepatitis B core antibody IgM, hepatitis B surface antigen, and hepatitis C antibody were negative on 01/21/2016.  Hepatitis B core antibody was negative on 02/22/2016.  G6PD assay was negative on 02/22/2016.  She developed acute renal insufficiency after initiation of Septra.  Renal ultrasound on 03/04/2016 revealed no evidence of hydronephrosis.  Baseline creatinine is 0.91 - 1.13 (CrCl 44 - 54 ml/min).  Creatinine was 1.54 (CrCl 29 ml/min) on 03/04/2016 and improved to baseline off Septra.  She received weekly Rituxan x 4 (03/11/2016 - 04/01/2016).  She tolerated her infusions well.  PET scan on 05/01/2016 revealed significant partial metabolic response. There was residual mildly hypermetabolic left axillary and mediastinal lymphoma, decreased in size and metabolism. Additional previously described sites of hypermetabolic lymphoma had resolved.  PET scan on 02/20/2017 revealed no evidence for residual hypermetabolic disease in the neck, chest, abdomen, or pelvis.  PET scan on 02/15/2018 revealed a new enlarged and hypermetabolic left inguinal lymph node concerning for recurrent  tumor (6 mm with SUV 1.77 to 14 mm with SUV 7.33).  The perifissural nodule in the right lung measured 7 mm (previously 5 mm).  She is s/p 7 cycles maintenance Rituxan (06/03/2016 - 11/19/2017).  Symptomatically, she denies any B symptoms or recent infections.  Patient with several dermatology concerns.  She has a wound to the left lateral aspect of her left lower extremity sustained from blunt force trauma caused by week or basket.  She is applying SSD cream and nonstick dressings daily.  Patient has a black nevus to the superior aspect of her posterior torso.  Additionally, she has a cystic area abutting a vertebral body to the left lateral aspect of patient's thoracolumbar spine.  She advises  that areas have been evaluated by dermatology.  Exam is otherwise unremarkable.  WBC 9100 ([place] 6400). Hemoglobin 11.8 g/dL.  Potassium low at 3.4 mmol/L.  BUN 28 and creatinine 1.05 mg/dL CrCl 23.7 mL/min).  LDH and uric acid normal.  Plan: 1. Labs today: CBC with differential, CMP, LDH, uric acid 2. Marginal zone lymphoma  Doing well overall.  No symptoms.  Left inguinal adenopathy persist, and is stable in size.  Area hypermetabolic on PET.  Blood counts stable; WBC 9100 ([place] 6400).  Review indications for treatment, including development of the symptoms, bulky and disfiguring adenopathy, involvement of other organ systems.  Patient has no current indications for treatment.  Continue routine surveillance. 3. TLS monitoring  Labs reviewed.  LDH and uric acid stable.  No concern for TLS.  Continue daily prophylactic allopurinol dose, and maintain adequate hydration. 4. Questionable melanotic lesion to LEFT upper aspect of posterior torso  Stable in size and appearance.  Lesion has been evaluated by dermatology.  Encourage patient to follow-up with dermatology for further evaluation of concerning nevus. 5. Paravertebral cystic lesion  Stable in size and appearance.  Cystic lesion has been evaluated by dermatology, and advised that area was not of concern.  Encourage patient to follow-up with dermatology for further evaluation of concerning area. 6. LEFT lower extremity wound  Acute wound open and draining.  Patient has been seen by her PCP for this concern.   Allowing wound to heal by primary intention  SSD cream and nonstick dressings being applied daily.  Discussed signs and symptoms of infection including warmth, purulent drainage, lymphangitic spread, and development of fever.  Patient follow-up with PCP for ongoing evaluation and management of wound. 7. RTC in 4 months for MD assessment and labs (CBC with diff, CMP, LDH, uric acid).    Honor Loh, NP   06/22/2018, 11:25 AM   I saw and evaluated the patient, participating in the key portions of the service and reviewing pertinent diagnostic studies and records.  I reviewed the nurse practitioner's note and agree with the findings and the plan.  The assessment and plan were discussed with the patient.  Multiple questions were asked by the patient and answered.   Nolon Stalls, MD 06/22/2018,11:25 AM

## 2018-06-22 NOTE — Progress Notes (Signed)
Patient hit her left lower leg on a wicker basket recently and had a skin tear.  She does have edema in that ankle and foot as well.

## 2018-06-28 ENCOUNTER — Ambulatory Visit (INDEPENDENT_AMBULATORY_CARE_PROVIDER_SITE_OTHER): Payer: PPO | Admitting: Internal Medicine

## 2018-06-28 ENCOUNTER — Encounter: Payer: Self-pay | Admitting: Internal Medicine

## 2018-06-28 ENCOUNTER — Telehealth: Payer: Self-pay | Admitting: Internal Medicine

## 2018-06-28 VITALS — BP 138/70 | HR 64 | Temp 97.7°F | Ht <= 58 in | Wt 104.5 lb

## 2018-06-28 DIAGNOSIS — L97922 Non-pressure chronic ulcer of unspecified part of left lower leg with fat layer exposed: Secondary | ICD-10-CM

## 2018-06-28 MED ORDER — CEPHALEXIN 500 MG PO TABS: 500.0000 mg | ORAL_TABLET | Freq: Three times a day (TID) | ORAL | 1 refills | Status: DC

## 2018-06-28 NOTE — Patient Instructions (Signed)
Please start the antibiotic only if there is an increase in heat and redness in the wound. If there is continued marked drainage from the wound, I will change to a different dressing (alginate). Call me and I will try to arrange it with the pharmacist.

## 2018-06-28 NOTE — Assessment & Plan Note (Addendum)
And devitalized tissue Discussed pros and cons---verbal consent Devitalized tissue removed with sterile pickups and scissors Dressed with silvadene and dry sterile dressing  Discussed--not really infected Will give cephalexin Rx in case it gets more red If ongoing exudate, will change to alginate dressing She will wash and dress it daily Discussed keeping it elevated if she is not walking---to minimize the swelling

## 2018-06-28 NOTE — Telephone Encounter (Signed)
Best number [phone] Pt called wanting appointment  She saw dr [name] 11/4 her leg is still red around ankle /seeping/fevered

## 2018-06-28 NOTE — Telephone Encounter (Signed)
I should see her today Please add her at 4:45PM if I don't have anything sooner

## 2018-06-28 NOTE — Telephone Encounter (Signed)
I spoke to patient and she scheduled appointment for today at 4:45.

## 2018-06-28 NOTE — Telephone Encounter (Signed)
Spoke to patient and was advised that she feels that her leg has gotten worse. Patient stated that there is a big gap in the wound. Patient stated that she does not have a fever, but her leg is warm to the touch. Patient started that the wound is draining clear and sometimes a bit of blood. Patient wants to know if she needs to be seen again or what is recommended? Pharmacy [facility]

## 2018-06-28 NOTE — Progress Notes (Signed)
Subjective:    Patient ID: Joan Hull, female    DOB: 16-Aug-1926, 84 y.o.   MRN: [id number]  HPI Here due to concern for infection in skin tear  Left calf---- silvadene daily She pulled off some dark eschar---but was afraid to remove the rest She notices swelling in ankle and is worried about infection Not much pain--using tylenol  Is having discharge--keeping bandage wet  Current Outpatient Medications on File Prior to Visit  Medication Sig Dispense Refill  . acyclovir (ZOVIRAX) 400 MG tablet Take 400 mg by mouth continuous as needed.    Marland Kitchen allopurinol (ZYLOPRIM) 300 MG tablet TAKE 1 TABLET BY MOUTH ONCE DAILY 90 tablet 0  . atenolol (TENORMIN) 25 MG tablet Take 25 mg 2 (two) times daily by mouth. 50--AM 25--PM    . Biotin 10 MG CAPS Take by mouth.    . diltiazem (CARDIZEM CD) 180 MG 24 hr capsule Take 180 mg by mouth daily.   5  . ELIQUIS 2.5 MG TABS tablet TAKE 1 TABLET BY MOUTH TWICE DAILY 180 tablet 3  . famotidine (PEPCID) 20 MG tablet Take 20 mg by mouth daily.    . Multiple Vitamins-Minerals (CENTRUM SILVER 50+WOMEN PO) Take 1 tablet by mouth.    Marland Kitchen PARoxetine (PAXIL) 10 MG tablet TAKE ONE TABLET BY MOUTH ONCE DAILY 90 tablet 3  . Probiotic Product (PROBIOTIC DAILY PO) Take 1 capsule by mouth daily.     . silver sulfADIAZINE (SILVADENE) 1 % cream Apply 1 application topically daily. 50 g 0  . torsemide (DEMADEX) 20 MG tablet TAKE 1/2 (ONE-HALF) TABLET BY MOUTH ONCE DAILY 45 tablet 1  . vitamin B-12 (CYANOCOBALAMIN) 100 MCG tablet Take 100 mcg by mouth daily.    Marland Kitchen zolpidem (AMBIEN) 5 MG tablet TAKE 1 TABLET BY MOUTH AT BEDTIME 90 tablet 0   No current facility-administered medications on file prior to visit.     Allergies  Allergen Reactions  . Simvastatin Other (See Comments)    SORE JOINTS  . Aspirin Other (See Comments)    Bladder spasm  . Diazepam     REACTION: worsened bladder problems  . Hydrochlorothiazide Other (See Comments)  . Chlorhexidine Rash   Skin reacts only to scrub    Past Medical History:  Diagnosis Date  . Anxiety   . Arthritis    fingers  . Atrial fibrillation ([place])   . CAD (coronary artery disease)   . CAD (coronary artery disease)   . Cancer ([place]) 2017   non-hodgkins lymphoma  . Depression   . Dysrhythmia    A-Fib  . GERD (gastroesophageal reflux disease)   . H/O Clostridium difficile infection   . Heart murmur   . History of immunotherapy 02/20/2017  . Hx of colonic polyps   . Hyperlipidemia   . Hypertension   . Interstitial cystitis   . Non Q wave myocardial infarction ([place]) 06/27/11   [facility]  . Osteoporosis   . Pacemaker   . Presence of permanent cardiac pacemaker   . Shortness of breath dyspnea    doe    Past Surgical History:  Procedure Laterality Date  . AXILLARY LYMPH NODE BIOPSY Left 02/06/2016   Procedure: AXILLARY LYMPH NODE BIOPSY;  Surgeon: [name], MD;  Location: [facility];  Service: General;  Laterality: Left;  . CARDIAC CATHETERIZATION    . CATARACT EXTRACTION Right 2005  . CATARACT EXTRACTION W/PHACO Left 12/28/2017   Procedure: CATARACT EXTRACTION PHACO AND INTRAOCULAR LENS PLACEMENT ([facility]) LEFT;  Surgeon: Leandrew Koyanagi, MD;  Location: [facility];  Service: Ophthalmology;  Laterality: Left;  . COLONOSCOPY  2007   [name]  . CORONARY ANGIOPLASTY    . CORONARY ARTERY BYPASS GRAFT  1/13   [facility], X4  . CORONARY STENT PLACEMENT  7/12   RCA with drug eluting stent---[name]  . EYE SURGERY    . INSERT / REPLACE / REMOVE PACEMAKER    . PACEMAKER INSERTION  5/13   [facility]    Family History  Problem Relation Age of Onset  . Heart disease Mother        cad  . Hypertension Mother   . Heart disease Sister        half sister MI  . Cancer Sister        BRAIN  . Heart disease Father        heart attack  . Heart disease Brother        cabg  . Heart disease Brother   . Cancer Brother        BLADDER  . Cancer Sister 36       OVARIAN    Social History    Socioeconomic History  . Marital status: Widowed    Spouse name: Not on file  . Number of children: 1  . Years of education: Not on file  . Highest education level: Not on file  Occupational History  . 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 Financial resource strain: Not on file  . Food insecurity:    Worry: Not on file    Inability: Not on file  . Transportation needs:    Medical: Not on file    Non-medical: Not on file  Tobacco Use  . Smoking status: Former Smoker    Packs/day: 1.00    Years: 3.00    Pack years: 3.00    Types: Cigarettes    Last attempt to quit: 08/18/1962    Years since quitting: 55.8  . Smokeless tobacco: Never Used  Substance and Sexual Activity  . Alcohol use: No    Alcohol/week: 0.0 standard drinks  . Drug use: No  . Sexual activity: Not on file  Lifestyle  . Physical activity:    Days per week: Not on file    Minutes per session: Not on file  . Stress: Not on file  Relationships  . Social connections:    Talks on phone: Not on file    Gets together: Not on file    Attends religious service: Not on file    Active member of club or organization: Not on file    Attends meetings of clubs or organizations: Not on file    Relationship status: Not on file  . Intimate partner violence:    Fear of current or ex partner: Not on file    Emotionally abused: Not on file    Physically abused: Not on file    Forced sexual activity: Not on file  Other Topics Concern  . Not on file  Social History Narrative   Now has living will   Son Joan Hull is her health care POA. Alternate is sister Joan Hull.   Would accept resuscitation attempts--but no prolonged ventilation   No tube feeds if cognitively unaware   Review of Systems  No fever No vomiting or diarrhea Appetite remains fine Has spot on her upper back she wants me to check     Objective:   Physical Exam  Constitutional: She appears well-developed. No distress.   Musculoskeletal:  1+ edema in left foot and ankle  Skin:  Open area is 10 x 3cm (at widest) on anterior left calf Devitalized black tissue along deep fissure on medial side Some tenderness but not really warm or significantly red Early granulation on lateral (more shallow side)           Assessment & Plan:

## 2018-06-28 NOTE — Telephone Encounter (Signed)
Okay; thanks.

## 2018-07-02 ENCOUNTER — Telehealth: Payer: Self-pay | Admitting: Internal Medicine

## 2018-07-02 MED ORDER — CEPHALEXIN 500 MG PO TABS: 500.0000 mg | ORAL_TABLET | Freq: Three times a day (TID) | ORAL | 1 refills | Status: DC

## 2018-07-02 NOTE — Telephone Encounter (Signed)
Best number [phone]  [facility] [address]  Pt called stating her leg is still swollen and still seeping. Foot is swollen to where she cannot wear her shoes she has to wear flip flops.  At night is really swollen,  She stated it is still red across her ankle.  Please advise what pt needs to do

## 2018-07-02 NOTE — Telephone Encounter (Signed)
If she didn't fill the antibiotic Rx--she should. If she is done, can refill the cephalexin I sent I had not seen a reason for a fluid medication----she should keep it elevated when not walking on it.  I expect the ulcer to takes many weeks to heal--so I think it is not surprising that it is not better yet

## 2018-07-02 NOTE — Telephone Encounter (Signed)
Spoke to pt. She did not take the antibiotic. I advised her to take the antibiotic and keep it elevated and let me know how she is doing on Monday. If she is not better, Dr [name] will need to see her.

## 2018-07-07 NOTE — Telephone Encounter (Signed)
Spoke to pt. She will go get the refill of the antibiotic and call us Monday if it is not scabbing up.

## 2018-07-07 NOTE — Telephone Encounter (Signed)
I can extend the antibiotic if she thinks there is some ongoing redness (5 days more). Not much to do about the scar----she probably will have one but not much to do now to avoid it. If the wound is not scabbing up by next week, may want to refer to wound care clinic

## 2018-07-07 NOTE — Telephone Encounter (Signed)
There is a refill on the antibiotic. She can just ask the pharmacy for it.

## 2018-07-07 NOTE — Telephone Encounter (Signed)
Pt called back to give update. She is taking her last antibiotic pill today. It is a little better, and her feet are still swollen but have got better. It has a little drainage but not much. She is concerned about a scar since she wears dresses to church and wants to know what to do.

## 2018-07-13 NOTE — Telephone Encounter (Signed)
Spoke to pt. She will keep it elevated and using Silvadene.

## 2018-07-13 NOTE — Telephone Encounter (Signed)
Probably doesn't need any more oral antibiotics ----just the topical (the silvadene does have antibiotic) Have her keep it elevated when she can I expect 1-2 months before really healed well at this point---so she shouldn't be frustrated that it is not better yet

## 2018-07-13 NOTE — Telephone Encounter (Signed)
Pt calling; was seen on 06/21/18 and 06/28/18 for cut on leg. Pt has taken 2 prescriptions of abx. The area is better but still not scabbing over; pt is using silvadene cream and bandaid. Pt still having problem with swelling in leg and foot. Overnight swelling does go down. No pain or soreness; redness around area that is still not healed but better than when seen on 06/28/18. Pt wants to know if needs another prescription for same abx or different abx. Pt request cb. [place]

## 2018-08-09 ENCOUNTER — Encounter: Payer: Self-pay | Admitting: Internal Medicine

## 2018-08-09 ENCOUNTER — Telehealth: Payer: Self-pay | Admitting: Internal Medicine

## 2018-08-09 ENCOUNTER — Ambulatory Visit (INDEPENDENT_AMBULATORY_CARE_PROVIDER_SITE_OTHER): Payer: PPO | Admitting: Internal Medicine

## 2018-08-09 VITALS — BP 104/84 | HR 106 | Temp 97.5°F | Ht <= 58 in | Wt 105.0 lb

## 2018-08-09 DIAGNOSIS — L97922 Non-pressure chronic ulcer of unspecified part of left lower leg with fat layer exposed: Secondary | ICD-10-CM | POA: Diagnosis not present

## 2018-08-09 NOTE — Telephone Encounter (Signed)
Spoke to pt. She is coming at 52 today

## 2018-08-09 NOTE — Telephone Encounter (Signed)
Left message to call office, again. I just need to know if she is using the silvadene. If she calls back and I am in a room, please get triage to speak to her.

## 2018-08-09 NOTE — Telephone Encounter (Signed)
Spoke to [place]. She said the pt has not been using the silvadene as much as she should as she still had quite a bit left from her rx. Florentina Jenny said she cleaned it and put a lot of silvadene and a bandage on it. She said she thinks it needs another debridement and knows it will probably be awhile before she could get it. Said the swelling has gone down.

## 2018-08-09 NOTE — Assessment & Plan Note (Signed)
Marked improvement Cleaned slough here Redressed with silvadene and dry sterile dressing Discussed home care

## 2018-08-09 NOTE — Progress Notes (Signed)
Subjective:    Patient ID: Joan Hull, female    DOB: 09-22-1925, 84 y.o.   MRN: [id number]  HPI Here for recheck on her left leg wound Did have visit from nurse today--she was concerned that it would need to be debrided Never went to the wound center---hoping it would get better  She noted more swelling at the end of the day Better in the morning Just some stinging pain Using the silvadene daily  Current Outpatient Medications on File Prior to Visit  Medication Sig Dispense Refill  . allopurinol (ZYLOPRIM) 300 MG tablet TAKE 1 TABLET BY MOUTH ONCE DAILY 90 tablet 0  . atenolol (TENORMIN) 25 MG tablet Take 25 mg 2 (two) times daily by mouth. 50--AM 25--PM    . Biotin 10 MG CAPS Take by mouth.    . diltiazem (CARDIZEM CD) 180 MG 24 hr capsule Take 180 mg by mouth daily.   5  . ELIQUIS 2.5 MG TABS tablet TAKE 1 TABLET BY MOUTH TWICE DAILY 180 tablet 3  . famotidine (PEPCID) 20 MG tablet Take 20 mg by mouth daily.    . Multiple Vitamins-Minerals (CENTRUM SILVER 50+WOMEN PO) Take 1 tablet by mouth.    Marland Kitchen PARoxetine (PAXIL) 10 MG tablet TAKE ONE TABLET BY MOUTH ONCE DAILY 90 tablet 3  . Probiotic Product (PROBIOTIC DAILY PO) Take 1 capsule by mouth daily.     . silver sulfADIAZINE (SILVADENE) 1 % cream Apply 1 application topically daily. 50 g 0  . torsemide (DEMADEX) 20 MG tablet TAKE 1/2 (ONE-HALF) TABLET BY MOUTH ONCE DAILY 45 tablet 1  . vitamin B-12 (CYANOCOBALAMIN) 100 MCG tablet Take 100 mcg by mouth daily.    Marland Kitchen zolpidem (AMBIEN) 5 MG tablet TAKE 1 TABLET BY MOUTH AT BEDTIME 90 tablet 0   No current facility-administered medications on file prior to visit.     Allergies  Allergen Reactions  . Simvastatin Other (See Comments)    SORE JOINTS  . Aspirin Other (See Comments)    Bladder spasm  . Diazepam     REACTION: worsened bladder problems  . Hydrochlorothiazide Other (See Comments)  . Chlorhexidine Rash    Skin reacts only to scrub    Past Medical History:    Diagnosis Date  . Anxiety   . Arthritis    fingers  . Atrial fibrillation ([place])   . CAD (coronary artery disease)   . CAD (coronary artery disease)   . Cancer ([place]) 2017   non-hodgkins lymphoma  . Depression   . Dysrhythmia    A-Fib  . GERD (gastroesophageal reflux disease)   . H/O Clostridium difficile infection   . Heart murmur   . History of immunotherapy 02/20/2017  . Hx of colonic polyps   . Hyperlipidemia   . Hypertension   . Interstitial cystitis   . Non Q wave myocardial infarction ([place]) 06/27/11   [facility]  . Osteoporosis   . Pacemaker   . Presence of permanent cardiac pacemaker   . Shortness of breath dyspnea    doe    Past Surgical History:  Procedure Laterality Date  . AXILLARY LYMPH NODE BIOPSY Left 02/06/2016   Procedure: AXILLARY LYMPH NODE BIOPSY;  Surgeon: [name], MD;  Location: [facility];  Service: General;  Laterality: Left;  . CARDIAC CATHETERIZATION    . CATARACT EXTRACTION Right 2005  . CATARACT EXTRACTION W/PHACO Left 12/28/2017   Procedure: CATARACT EXTRACTION PHACO AND INTRAOCULAR LENS PLACEMENT ([place]) LEFT;  Surgeon: Leandrew Koyanagi, MD;  Location: [place];  Service: Ophthalmology;  Laterality: Left;  . COLONOSCOPY  2007   [name]  . CORONARY ANGIOPLASTY    . CORONARY ARTERY BYPASS GRAFT  1/13   [facility], X4  . CORONARY STENT PLACEMENT  7/12   RCA with drug eluting stent---[name]  . EYE SURGERY    . INSERT / REPLACE / REMOVE PACEMAKER    . PACEMAKER INSERTION  5/13   [facility]    Family History  Problem Relation Age of Onset  . Heart disease Mother        cad  . Hypertension Mother   . Heart disease Sister        half sister MI  . Cancer Sister        BRAIN  . Heart disease Father        heart attack  . Heart disease Brother        cabg  . Heart disease Brother   . Cancer Brother        BLADDER  . Cancer Sister 46       OVARIAN    Social History   Socioeconomic History  . Marital status: Widowed     Spouse name: Not on file  . Number of children: 1  . Years of education: Not on file  . Highest education level: Not on file  Occupational History  . 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 Financial resource strain: Not on file  . Food insecurity:    Worry: Not on file    Inability: Not on file  . Transportation needs:    Medical: Not on file    Non-medical: Not on file  Tobacco Use  . Smoking status: Former Smoker    Packs/day: 1.00    Years: 3.00    Pack years: 3.00    Types: Cigarettes    Last attempt to quit: 08/18/1962    Years since quitting: 56.0  . Smokeless tobacco: Never Used  Substance and Sexual Activity  . Alcohol use: No    Alcohol/week: 0.0 standard drinks  . Drug use: No  . Sexual activity: Not on file  Lifestyle  . Physical activity:    Days per week: Not on file    Minutes per session: Not on file  . Stress: Not on file  Relationships  . Social connections:    Talks on phone: Not on file    Gets together: Not on file    Attends religious service: Not on file    Active member of club or organization: Not on file    Attends meetings of clubs or organizations: Not on file    Relationship status: Not on file  . Intimate partner violence:    Fear of current or ex partner: Not on file    Emotionally abused: Not on file    Physically abused: Not on file    Forced sexual activity: Not on file  Other Topics Concern  . Not on file  Social History Narrative   Now has living will   Son Joan Hull is her health care POA. Alternate is sister Joan Hull.   Would accept resuscitation attempts--but no prolonged ventilation   No tube feeds if cognitively unaware   Review of Systems No fever Feels good --other than when she gets a catch in her flank at times. Just has to stop temporarily Does her own shopping and stays active    Objective:   Physical Exam  Constitutional: She appears well-developed. No distress.  Skin:  Wound is  markedly better on left calf 5cm x 10-52mm across Some green slough (mostly cleaned with saline and some pressure) Surrounding dark stasis changes around wound but not warm or tender           Assessment & Plan:

## 2018-08-09 NOTE — Telephone Encounter (Signed)
Joan Hull/Nurse with [employer] called office stating she did a wellness visit with the pt and the pt had a open wound on her left shin. Pt stated she could not go to a wound clinic due to financial issues. Nurse stated the wound needed to be debrided. Nurse wanted to know if Dr.LEtvak would give the pt a ointment or what steps would be taken.

## 2018-08-09 NOTE — Telephone Encounter (Signed)
You can add her at 4:45PM today----or at end of schedule tomorrow (though we are not supposed to be adding tomorrow)

## 2018-08-09 NOTE — Telephone Encounter (Signed)
Left a message for Joan Hull to find out if she is still using the silvadene every day.

## 2018-08-09 NOTE — Telephone Encounter (Signed)
Karla/Nurse Practitioner says she is in clinic with pt's so to leave a detailed message.

## 2018-08-15 ENCOUNTER — Emergency Department: Payer: PPO

## 2018-08-15 ENCOUNTER — Encounter: Payer: Self-pay | Admitting: Emergency Medicine

## 2018-08-15 ENCOUNTER — Inpatient Hospital Stay
Admission: EM | Admit: 2018-08-15 | Discharge: 2018-08-18 | DRG: 194 | Disposition: E | Payer: PPO | Attending: Internal Medicine | Admitting: Internal Medicine

## 2018-08-15 ENCOUNTER — Other Ambulatory Visit: Payer: Self-pay

## 2018-08-15 DIAGNOSIS — M19042 Primary osteoarthritis, left hand: Secondary | ICD-10-CM | POA: Diagnosis present

## 2018-08-15 DIAGNOSIS — Z8249 Family history of ischemic heart disease and other diseases of the circulatory system: Secondary | ICD-10-CM

## 2018-08-15 DIAGNOSIS — E875 Hyperkalemia: Secondary | ICD-10-CM | POA: Diagnosis present

## 2018-08-15 DIAGNOSIS — M810 Age-related osteoporosis without current pathological fracture: Secondary | ICD-10-CM | POA: Diagnosis not present

## 2018-08-15 DIAGNOSIS — Z809 Family history of malignant neoplasm, unspecified: Secondary | ICD-10-CM

## 2018-08-15 DIAGNOSIS — I25119 Atherosclerotic heart disease of native coronary artery with unspecified angina pectoris: Secondary | ICD-10-CM | POA: Diagnosis not present

## 2018-08-15 DIAGNOSIS — N179 Acute kidney failure, unspecified: Secondary | ICD-10-CM | POA: Diagnosis present

## 2018-08-15 DIAGNOSIS — I4891 Unspecified atrial fibrillation: Secondary | ICD-10-CM | POA: Diagnosis not present

## 2018-08-15 DIAGNOSIS — M19041 Primary osteoarthritis, right hand: Secondary | ICD-10-CM | POA: Diagnosis present

## 2018-08-15 DIAGNOSIS — E785 Hyperlipidemia, unspecified: Secondary | ICD-10-CM | POA: Diagnosis present

## 2018-08-15 DIAGNOSIS — Z9842 Cataract extraction status, left eye: Secondary | ICD-10-CM | POA: Diagnosis not present

## 2018-08-15 DIAGNOSIS — Z9841 Cataract extraction status, right eye: Secondary | ICD-10-CM

## 2018-08-15 DIAGNOSIS — J181 Lobar pneumonia, unspecified organism: Secondary | ICD-10-CM

## 2018-08-15 DIAGNOSIS — N183 Chronic kidney disease, stage 3 (moderate): Secondary | ICD-10-CM | POA: Diagnosis present

## 2018-08-15 DIAGNOSIS — Z86010 Personal history of colonic polyps: Secondary | ICD-10-CM | POA: Diagnosis not present

## 2018-08-15 DIAGNOSIS — Z951 Presence of aortocoronary bypass graft: Secondary | ICD-10-CM | POA: Diagnosis not present

## 2018-08-15 DIAGNOSIS — Z8572 Personal history of non-Hodgkin lymphomas: Secondary | ICD-10-CM | POA: Diagnosis not present

## 2018-08-15 DIAGNOSIS — Z8719 Personal history of other diseases of the digestive system: Secondary | ICD-10-CM | POA: Diagnosis not present

## 2018-08-15 DIAGNOSIS — Z87891 Personal history of nicotine dependence: Secondary | ICD-10-CM

## 2018-08-15 DIAGNOSIS — K219 Gastro-esophageal reflux disease without esophagitis: Secondary | ICD-10-CM | POA: Diagnosis present

## 2018-08-15 DIAGNOSIS — I509 Heart failure, unspecified: Secondary | ICD-10-CM | POA: Diagnosis not present

## 2018-08-15 DIAGNOSIS — J439 Emphysema, unspecified: Secondary | ICD-10-CM | POA: Diagnosis not present

## 2018-08-15 DIAGNOSIS — I130 Hypertensive heart and chronic kidney disease with heart failure and stage 1 through stage 4 chronic kidney disease, or unspecified chronic kidney disease: Secondary | ICD-10-CM | POA: Diagnosis not present

## 2018-08-15 DIAGNOSIS — K449 Diaphragmatic hernia without obstruction or gangrene: Secondary | ICD-10-CM | POA: Diagnosis not present

## 2018-08-15 DIAGNOSIS — J189 Pneumonia, unspecified organism: Secondary | ICD-10-CM | POA: Diagnosis present

## 2018-08-15 DIAGNOSIS — Z886 Allergy status to analgesic agent status: Secondary | ICD-10-CM

## 2018-08-15 DIAGNOSIS — Z888 Allergy status to other drugs, medicaments and biological substances status: Secondary | ICD-10-CM

## 2018-08-15 DIAGNOSIS — I2510 Atherosclerotic heart disease of native coronary artery without angina pectoris: Secondary | ICD-10-CM | POA: Diagnosis not present

## 2018-08-15 DIAGNOSIS — I469 Cardiac arrest, cause unspecified: Secondary | ICD-10-CM | POA: Diagnosis not present

## 2018-08-15 DIAGNOSIS — R0602 Shortness of breath: Secondary | ICD-10-CM | POA: Diagnosis not present

## 2018-08-15 DIAGNOSIS — S22000D Wedge compression fracture of unspecified thoracic vertebra, subsequent encounter for fracture with routine healing: Secondary | ICD-10-CM | POA: Diagnosis not present

## 2018-08-15 DIAGNOSIS — Z955 Presence of coronary angioplasty implant and graft: Secondary | ICD-10-CM | POA: Diagnosis not present

## 2018-08-15 DIAGNOSIS — Z885 Allergy status to narcotic agent status: Secondary | ICD-10-CM

## 2018-08-15 DIAGNOSIS — Z7901 Long term (current) use of anticoagulants: Secondary | ICD-10-CM

## 2018-08-15 DIAGNOSIS — Z961 Presence of intraocular lens: Secondary | ICD-10-CM | POA: Diagnosis present

## 2018-08-15 DIAGNOSIS — Z950 Presence of cardiac pacemaker: Secondary | ICD-10-CM | POA: Diagnosis not present

## 2018-08-15 DIAGNOSIS — I4901 Ventricular fibrillation: Secondary | ICD-10-CM | POA: Diagnosis not present

## 2018-08-15 LAB — BASIC METABOLIC PANEL
Anion gap: 14 (ref 5–15)
BUN: 33 mg/dL — ABNORMAL HIGH (ref 8–23)
CALCIUM: 9.6 mg/dL (ref 8.9–10.3)
CHLORIDE: 104 mmol/L (ref 98–111)
CO2: 18 mmol/L — AB (ref 22–32)
CREATININE: 1.5 mg/dL — AB (ref 0.44–1.00)
GFR calc non Af Amer: 30 mL/min — ABNORMAL LOW (ref >60)
GFR, EST AFRICAN AMERICAN: 35 mL/min — AB (ref >60)
Glucose, Bld: 208 mg/dL — ABNORMAL HIGH (ref 70–99)
Potassium: 5.8 mmol/L — ABNORMAL HIGH (ref 3.5–5.1)
Sodium: 136 mmol/L (ref 135–145)

## 2018-08-15 LAB — CBC WITH DIFFERENTIAL/PLATELET
Abs Immature Granulocytes: 0.07 10*3/uL (ref 0.00–0.07)
BASOS ABS: 0 10*3/uL (ref 0.0–0.1)
Basophils Relative: 0 %
EOS ABS: 0 10*3/uL (ref 0.0–0.5)
EOS PCT: 0 %
HEMATOCRIT: 42.8 % (ref 36.0–46.0)
HEMOGLOBIN: 13.5 g/dL (ref 12.0–15.0)
Immature Granulocytes: 1 %
LYMPHS ABS: 0.8 10*3/uL (ref 0.7–4.0)
Lymphocytes Relative: 9 %
MCH: 32.6 pg (ref 26.0–34.0)
MCHC: 31.5 g/dL (ref 30.0–36.0)
MCV: 103.4 fL — ABNORMAL HIGH (ref 80.0–100.0)
MONO ABS: 1.8 10*3/uL — AB (ref 0.1–1.0)
Monocytes Relative: 20 %
NRBC: 0.4 % — AB (ref 0.0–0.2)
Neutro Abs: 6.4 10*3/uL (ref 1.7–7.7)
Neutrophils Relative %: 70 %
Platelets: 251 10*3/uL (ref 150–400)
RBC: 4.14 MIL/uL (ref 3.87–5.11)
RDW: 14.8 % (ref 11.5–15.5)
WBC: 9.2 10*3/uL (ref 4.0–10.5)

## 2018-08-15 LAB — INFLUENZA PANEL BY PCR (TYPE A & B)
Influenza A By PCR: NEGATIVE
Influenza B By PCR: NEGATIVE

## 2018-08-15 LAB — POTASSIUM: Potassium: 4.9 mmol/L (ref 3.5–5.1)

## 2018-08-15 LAB — BRAIN NATRIURETIC PEPTIDE: B Natriuretic Peptide: 842 pg/mL — ABNORMAL HIGH (ref 0.0–100.0)

## 2018-08-15 MED ORDER — SODIUM CHLORIDE 0.9 % IV SOLN
500.0000 mg | Freq: Once | INTRAVENOUS | Status: AC
  Administered 2018-08-15: 500 mg via INTRAVENOUS
  Filled 2018-08-15: qty 500

## 2018-08-15 MED ORDER — ONDANSETRON HCL 4 MG/2ML IJ SOLN
4.0000 mg | Freq: Once | INTRAMUSCULAR | Status: AC
  Administered 2018-08-15: 4 mg via INTRAVENOUS

## 2018-08-15 MED ORDER — ONDANSETRON HCL 4 MG/2ML IJ SOLN
INTRAMUSCULAR | Status: AC
  Filled 2018-08-15: qty 2

## 2018-08-15 MED ORDER — SODIUM CHLORIDE 0.9 % IV SOLN
1.0000 g | Freq: Once | INTRAVENOUS | Status: AC
  Administered 2018-08-15: 1 g via INTRAVENOUS
  Filled 2018-08-15: qty 10

## 2018-08-15 NOTE — H&P (Signed)
[facility] [facility] [place] NAME: Joan Hull    MR#:  [id number]  DATE OF BIRTH:  1926-02-14  DATE OF ADMISSION:  08/12/2018  PRIMARY CARE PHYSICIAN: Venia Carbon, MD   REQUESTING/REFERRING PHYSICIAN: Corky Downs, MD  CHIEF COMPLAINT:   Chief Complaint  Patient presents with  . Shortness of Breath    HISTORY OF PRESENT ILLNESS:  Joan Hull  is a 84 y.o. female who presents with chief complaint as above.  Presents with several days of increased shortness of breath and cough.  She states she has some productive sputum, yellowish in color.  She has had increased shortness of breath.  Here in the ED she is found to have upper lobe pneumonia on chest x-ray.  Hospitalist were called for admission  PAST MEDICAL HISTORY:   Past Medical History:  Diagnosis Date  . Anxiety   . Arthritis    fingers  . Atrial fibrillation ([place])   . CAD (coronary artery disease)   . CAD (coronary artery disease)   . Cancer ([place]) 2017   non-hodgkins lymphoma  . Depression   . Dysrhythmia    A-Fib  . GERD (gastroesophageal reflux disease)   . H/O Clostridium difficile infection   . Heart murmur   . History of immunotherapy 02/20/2017  . Hx of colonic polyps   . Hyperlipidemia   . Hypertension   . Interstitial cystitis   . Non Q wave myocardial infarction ([place]) 06/27/11   [facility]  . Osteoporosis   . Pacemaker   . Presence of permanent cardiac pacemaker   . Shortness of breath dyspnea    doe     PAST SURGICAL HISTORY:   Past Surgical History:  Procedure Laterality Date  . AXILLARY LYMPH NODE BIOPSY Left 02/06/2016   Procedure: AXILLARY LYMPH NODE BIOPSY;  Surgeon: [name], MD;  Location: [facility];  Service: General;  Laterality: Left;  . CARDIAC CATHETERIZATION    . CATARACT EXTRACTION Right 2005  . CATARACT EXTRACTION W/PHACO Left 12/28/2017   Procedure: CATARACT EXTRACTION PHACO AND INTRAOCULAR LENS PLACEMENT ([place]) LEFT;   Surgeon: Leandrew Koyanagi, MD;  Location: [place];  Service: Ophthalmology;  Laterality: Left;  . COLONOSCOPY  2007   [name]  . CORONARY ANGIOPLASTY    . CORONARY ARTERY BYPASS GRAFT  1/13   [facility], X4  . CORONARY STENT PLACEMENT  7/12   RCA with drug eluting stent---[name]  . EYE SURGERY    . INSERT / REPLACE / REMOVE PACEMAKER    . PACEMAKER INSERTION  5/13   [facility]     SOCIAL HISTORY:   Social History   Tobacco Use  . Smoking status: Former Smoker    Packs/day: 1.00    Years: 3.00    Pack years: 3.00    Types: Cigarettes    Last attempt to quit: 08/18/1962    Years since quitting: 56.0  . Smokeless tobacco: Never Used  Substance Use Topics  . Alcohol use: No    Alcohol/week: 0.0 standard drinks     FAMILY HISTORY:   Family History  Problem Relation Age of Onset  . Heart disease Mother        cad  . Hypertension Mother   . Heart disease Sister        half sister MI  . Cancer Sister        BRAIN  . Heart disease Father        heart attack  .  Heart disease Brother        cabg  . Heart disease Brother   . Cancer Brother        BLADDER  . Cancer Sister 19       OVARIAN     DRUG ALLERGIES:   Allergies  Allergen Reactions  . Simvastatin Other (See Comments)    SORE JOINTS  . Aspirin Other (See Comments)    Bladder spasm  . Diazepam     REACTION: worsened bladder problems  . Hydrochlorothiazide Other (See Comments)  . Chlorhexidine Rash    Skin reacts only to scrub    MEDICATIONS AT HOME:   Prior to Admission medications   Medication Sig Start Date End Date Taking? Authorizing Provider  allopurinol (ZYLOPRIM) 300 MG tablet TAKE 1 TABLET BY MOUTH ONCE DAILY 05/25/18   Lequita Asal, MD  atenolol (TENORMIN) 25 MG tablet Take 25 mg 2 (two) times daily by mouth. 50--AM 25--PM 08/05/16   [provider]  Biotin 10 MG CAPS Take by mouth.    [provider]  diltiazem (CARDIZEM CD) 180 MG 24 hr capsule Take  180 mg by mouth daily.  06/22/14   [provider]  ELIQUIS 2.5 MG TABS tablet TAKE 1 TABLET BY MOUTH TWICE DAILY 02/23/18   [name], MD  famotidine (PEPCID) 20 MG tablet Take 20 mg by mouth daily.    [provider]  Multiple Vitamins-Minerals (CENTRUM SILVER 50+WOMEN PO) Take 1 tablet by mouth.    [provider]  PARoxetine (PAXIL) 10 MG tablet TAKE ONE TABLET BY MOUTH ONCE DAILY 10/05/17   Venia Carbon, MD  Probiotic Product (PROBIOTIC DAILY PO) Take 1 capsule by mouth daily.     [provider]  silver sulfADIAZINE (SILVADENE) 1 % cream Apply 1 application topically daily. 06/21/18   Venia Carbon, MD  torsemide (DEMADEX) 20 MG tablet TAKE 1/2 (ONE-HALF) TABLET BY MOUTH ONCE DAILY 10/05/17   Venia Carbon, MD  vitamin B-12 (CYANOCOBALAMIN) 100 MCG tablet Take 100 mcg by mouth daily.    [provider]  zolpidem (AMBIEN) 5 MG tablet TAKE 1 TABLET BY MOUTH AT BEDTIME 06/01/18   Ria Bush, MD    REVIEW OF SYSTEMS:  Review of Systems  Constitutional: Positive for malaise/fatigue. Negative for chills, fever and weight loss.  HENT: Negative for ear pain, hearing loss and tinnitus.   Eyes: Negative for blurred vision, double vision, pain and redness.  Respiratory: Positive for cough, sputum production and shortness of breath. Negative for hemoptysis.   Cardiovascular: Negative for chest pain, palpitations, orthopnea and leg swelling.  Gastrointestinal: Negative for abdominal pain, constipation, diarrhea, nausea and vomiting.  Genitourinary: Negative for dysuria, frequency and hematuria.  Musculoskeletal: Negative for back pain, joint pain and neck pain.  Skin:       No acne, rash, or lesions  Neurological: Negative for dizziness, tremors, focal weakness and weakness.  Endo/Heme/Allergies: Negative for polydipsia. Does not bruise/bleed easily.  Psychiatric/Behavioral: Negative for depression. The patient is not  nervous/anxious and does not have insomnia.      VITAL SIGNS:   Vitals:   08/07/2018 1621 07/30/2018 1623 07/30/2018 1922 08/10/2018 2100  BP:  109/64 131/87   Pulse: 75  99 (!) 108  Resp: 18     Temp: 97.7 F (36.5 C)  98.1 F (36.7 C)   TempSrc: Oral  Oral   SpO2: 96%  97% 97%  Weight:      Height:  Wt Readings from Last 3 Encounters:  07/29/2018 46.7 kg  08/09/18 47.6 kg  06/28/18 47.4 kg    PHYSICAL EXAMINATION:  Physical Exam  Vitals reviewed. Constitutional: She is oriented to person, place, and time. She appears well-developed and well-nourished. No distress.  HENT:  Head: Normocephalic and atraumatic.  Mouth/Throat: Oropharynx is clear and moist.  Eyes: Pupils are equal, round, and reactive to light. Conjunctivae and EOM are normal. No scleral icterus.  Neck: Normal range of motion. Neck supple. No JVD present. No thyromegaly present.  Cardiovascular: Normal rate and intact distal pulses. Exam reveals no gallop and no friction rub.  No murmur heard. Irregular rhythm  Respiratory: Effort normal. No respiratory distress. She has no wheezes. She has rales.  Right upper lobe rhonchi  GI: Soft. Bowel sounds are normal. She exhibits no distension. There is no abdominal tenderness.  Musculoskeletal: Normal range of motion.        General: No edema.     Comments: No arthritis, no gout  Lymphadenopathy:    She has no cervical adenopathy.  Neurological: She is alert and oriented to person, place, and time. No cranial nerve deficit.  No dysarthria, no aphasia  Skin: Skin is warm and dry. No rash noted. No erythema.  Psychiatric: She has a normal mood and affect. Her behavior is normal. Judgment and thought content normal.    LABORATORY PANEL:   CBC Recent Labs  Lab 08/01/2018 1630  WBC 9.2  HGB 13.5  HCT 42.8  PLT 251   ------------------------------------------------------------------------------------------------------------------  Chemistries  Recent Labs   Lab 08/05/2018 1630  NA 136  K 5.8*  CL 104  CO2 18*  GLUCOSE 208*  BUN 33*  CREATININE 1.50*  CALCIUM 9.6   ------------------------------------------------------------------------------------------------------------------  Cardiac Enzymes No results for input(s): TROPONINI in the last 168 hours. ------------------------------------------------------------------------------------------------------------------  RADIOLOGY:  Dg Chest 2 View  Result Date: 08/14/2018 CLINICAL DATA:  New onset weakness and SOB with exertion for the past week, non smoker Hx of non-Hodgkin's lymphoma, CAD, pacemaker insert 5-13, stent placement 7-12, AFIB EXAM: CHEST - 2 VIEW COMPARISON:  06/26/2017 FINDINGS: Patient has a LEFT-sided transvenous pacemaker with leads to the RIGHT atrium and RIGHT ventricle. The heart is enlarged. There is atherosclerotic calcification of the thoracic aorta. The azygos vein appears prominent, consistent with elevated RIGHT heart pressures. Interstitial prominence bilaterally is consistent with interstitial edema. More focal opacity in the RIGHT UPPER lobe may represent infectious infiltrate. Numerous wedge compression fractures are again noted in the thoracic spine. IMPRESSION: 1. Cardiomegaly and interstitial edema. 2. RIGHT UPPER lobe infiltrate. Electronically Signed   By: Nolon Nations M.D.   On: 07/30/2018 18:17    EKG:   Orders placed or performed during the hospital encounter of 07/28/2018  . ED EKG  . ED EKG    IMPRESSION AND PLAN:  Principal Problem:   CAP (community acquired pneumonia) -IV antibiotics initiated, PRN duo nebs, PRN antitussive Active Problems:   Acute CHF (congestive heart failure) ([place]) -likely related to her A. fib and acute stress from her pneumonia.  No prior history of CHF.  Though she does have extensive cardiac history.  We will give her a dose of IV Lasix, and get an echocardiogram and a cardiology consult   Atherosclerotic heart disease  of native coronary artery with angina pectoris ([place]) -continue home meds   ATRIAL FIBRILLATION -currently in A. fib with paced rhythm, continue home meds including anticoagulation   Hyperlipemia -Home dose antilipid   GERD -home  dose H2 blocker  Chart review performed and case discussed with ED provider. Labs, imaging and/or ECG reviewed by provider and discussed with patient/family. Management plans discussed with the patient and/or family.  DVT PROPHYLAXIS: Systemic anticoagulation  GI PROPHYLAXIS:  H2 blocker  ADMISSION STATUS: Inpatient     CODE STATUS: Full  TOTAL TIME TAKING CARE OF THIS PATIENT: 45 minutes.   Cotina Freedman FIELDING 08/08/2018, 10:48 PM  [facility]  Office  [phone]  CC: Primary care physician; Venia Carbon, MD  Note:  This document was prepared using Dragon voice recognition software and may include unintentional dictation errors.

## 2018-08-15 NOTE — ED Notes (Signed)
Pt to triage for EKG: pt awake and alert, oriented x 3; talking in complete coherent sentences; pt says she's here tonight because she was feeling weak and out of breath; says right now she's feeling "so much better" than when she came in

## 2018-08-15 NOTE — ED Notes (Signed)
Pt nauseated. Order for zofran received.

## 2018-08-15 NOTE — ED Provider Notes (Signed)
[facility] Emergency Department Provider Note   ____________________________________________    I have reviewed the triage vital signs and the nursing notes.   HISTORY  Chief Complaint Shortness of Breath     HPI Joan Hull is a 84 y.o. female who presents with complaints of fatigue, weakness, shortness of breath, mild cough.  Patient reports symptoms been ongoing for approximately 2 to 3 days although she reports "I am always short of breath ".  She does see Dr. Ubaldo Glassing cardiology for "a leaky valve ".  Is not sure if she has had fever, does report intermittent chills, has not take anything for this.  No recent travel.  Does take Lasix   Past Medical History:  Diagnosis Date  . Anxiety   . Arthritis    fingers  . Atrial fibrillation ([place])   . CAD (coronary artery disease)   . CAD (coronary artery disease)   . Cancer ([place]) 2017   non-hodgkins lymphoma  . Depression   . Dysrhythmia    A-Fib  . GERD (gastroesophageal reflux disease)   . H/O Clostridium difficile infection   . Heart murmur   . History of immunotherapy 02/20/2017  . Hx of colonic polyps   . Hyperlipidemia   . Hypertension   . Interstitial cystitis   . Non Q wave myocardial infarction ([place]) 06/27/11   [facility]  . Osteoporosis   . Pacemaker   . Presence of permanent cardiac pacemaker   . Shortness of breath dyspnea    doe    Patient Active Problem List   Diagnosis Date Noted  . CAP (community acquired pneumonia) 08/13/2018  . Traumatic leg ulcer, left, with fat layer exposed ([place]) 06/28/2018  . Superficial injury of left leg 06/21/2018  . Back pain 09/08/2017  . COPD (chronic obstructive pulmonary disease) with emphysema ([place]) 06/26/2017  . Elevated uric acid in blood 10/14/2016  . Encounter for antineoplastic immunotherapy 09/02/2016  . Marginal zone lymphoma ([place]) 02/06/2016  . Advance directive discussed with patient 06/22/2015  . Routine general medical  examination at a health care facility 01/20/2014  . OTHER CONSTIPATION 06/17/2010  . ATRIAL FIBRILLATION 08/29/2009  . GERD 07/24/2007  . COLONIC POLYPS, ADENOMATOUS 07/23/2007  . Hyperlipemia 07/23/2007  . Episodic mood disorder ([place]) 07/23/2007  . Atherosclerotic heart disease of native coronary artery with angina pectoris ([place]) 07/23/2007  . HIATAL HERNIA 07/23/2007  . INTERSTITIAL CYSTITIS 07/23/2007  . OSTEOARTHRITIS 07/23/2007  . Osteoporosis 07/23/2007    Past Surgical History:  Procedure Laterality Date  . AXILLARY LYMPH NODE BIOPSY Left 02/06/2016   Procedure: AXILLARY LYMPH NODE BIOPSY;  Surgeon: Orella Cushman Bellow, MD;  Location: [facility] ORS;  Service: General;  Laterality: Left;  . CARDIAC CATHETERIZATION    . CATARACT EXTRACTION Right 2005  . CATARACT EXTRACTION W/PHACO Left 12/28/2017   Procedure: CATARACT EXTRACTION PHACO AND INTRAOCULAR LENS PLACEMENT ([place]) LEFT;  Surgeon: Leandrew Koyanagi, MD;  Location: [place];  Service: Ophthalmology;  Laterality: Left;  . COLONOSCOPY  2007   [name]  . CORONARY ANGIOPLASTY    . CORONARY ARTERY BYPASS GRAFT  1/13   [facility], X4  . CORONARY STENT PLACEMENT  7/12   RCA with drug eluting stent---[name]  . EYE SURGERY    . INSERT / REPLACE / REMOVE PACEMAKER    . PACEMAKER INSERTION  5/13   [facility]    Prior to Admission medications   Medication Sig Start Date End Date Taking? Authorizing Provider  allopurinol (  ZYLOPRIM) 300 MG tablet TAKE 1 TABLET BY MOUTH ONCE DAILY 05/25/18   Lequita Asal, MD  atenolol (TENORMIN) 25 MG tablet Take 25 mg 2 (two) times daily by mouth. 50--AM 25--PM 08/05/16   [provider]  Biotin 10 MG CAPS Take by mouth.    [provider]  diltiazem (CARDIZEM CD) 180 MG 24 hr capsule Take 180 mg by mouth daily.  06/22/14   [provider]  ELIQUIS 2.5 MG TABS tablet TAKE 1 TABLET BY MOUTH TWICE DAILY 02/23/18   [name], MD  famotidine (PEPCID) 20 MG  tablet Take 20 mg by mouth daily.    [provider]  Multiple Vitamins-Minerals (CENTRUM SILVER 50+WOMEN PO) Take 1 tablet by mouth.    [provider]  PARoxetine (PAXIL) 10 MG tablet TAKE ONE TABLET BY MOUTH ONCE DAILY 10/05/17   Venia Carbon, MD  Probiotic Product (PROBIOTIC DAILY PO) Take 1 capsule by mouth daily.     [provider]  silver sulfADIAZINE (SILVADENE) 1 % cream Apply 1 application topically daily. 06/21/18   Venia Carbon, MD  torsemide (DEMADEX) 20 MG tablet TAKE 1/2 (ONE-HALF) TABLET BY MOUTH ONCE DAILY 10/05/17   Venia Carbon, MD  vitamin B-12 (CYANOCOBALAMIN) 100 MCG tablet Take 100 mcg by mouth daily.    [provider]  zolpidem (AMBIEN) 5 MG tablet TAKE 1 TABLET BY MOUTH AT BEDTIME 06/01/18   Ria Bush, MD     Allergies Simvastatin; Aspirin; Diazepam; Hydrochlorothiazide; and Chlorhexidine  Family History  Problem Relation Age of Onset  . Heart disease Mother        cad  . Hypertension Mother   . Heart disease Sister        half sister MI  . Cancer Sister        BRAIN  . Heart disease Father        heart attack  . Heart disease Brother        cabg  . Heart disease Brother   . Cancer Brother        BLADDER  . Cancer Sister 26       OVARIAN    Social History Social History   Tobacco Use  . Smoking status: Former Smoker    Packs/day: 1.00    Years: 3.00    Pack years: 3.00    Types: Cigarettes    Last attempt to quit: 08/18/1962    Years since quitting: 56.0  . Smokeless tobacco: Never Used  Substance Use Topics  . Alcohol use: No    Alcohol/week: 0.0 standard drinks  . Drug use: No    Review of Systems  Constitutional: As above Eyes: No visual changes.  ENT: No sore throat. Cardiovascular: Denies chest pain. Respiratory: As above Gastrointestinal: No abdominal pain.  Genitourinary: Negative for dysuria. Musculoskeletal: Negative for back pain. Skin: Negative for  rash. Neurological: Negative for headaches    ____________________________________________   PHYSICAL EXAM:  VITAL SIGNS: ED Triage Vitals  Enc Vitals Group     BP 08/11/2018 1623 109/64     Pulse Rate 07/24/2018 1621 75     Resp 08/12/2018 1621 18     Temp 08/17/2018 1621 97.7 F (36.5 C)     Temp Source 07/20/2018 1621 Oral     SpO2 08/06/2018 1621 96 %     Weight 08/06/2018 1618 46.7 kg (103 lb)     Height 08/10/2018 1618 1.473 m (4\' 10" )  Head Circumference --      Peak Flow --      Pain Score 08/01/2018 1618 0     Pain Loc --      Pain Edu? --      Excl. in [place]? --     Constitutional: Alert and oriented.  Eyes: Conjunctivae are normal.   Nose: No congestion/rhinnorhea. Mouth/Throat: Mucous membranes are moist.    Cardiovascular: Normal rate, regular rhythm.   Good peripheral circulation. Respiratory: Normal respiratory effort.  No retractions.  No wheezing Gastrointestinal: Soft and nontender. No distention.    Musculoskeletal: No lower extremity tenderness nor edema.  Warm and well perfused Neurologic:  Normal speech and language. No gross focal neurologic deficits are appreciated.  Skin:  Skin is warm, dry and intact. No rash noted. Psychiatric: Mood and affect are normal. Speech and behavior are normal.  ____________________________________________   LABS (all labs ordered are listed, but only abnormal results are displayed)  Labs Reviewed  CBC WITH DIFFERENTIAL/PLATELET - Abnormal; Notable for the following components:      Result Value   MCV 103.4 (*)    nRBC 0.4 (*)    Monocytes Absolute 1.8 (*)    All other components within normal limits  BASIC METABOLIC PANEL - Abnormal; Notable for the following components:   Potassium 5.8 (*)    CO2 18 (*)    Glucose, Bld 208 (*)    BUN 33 (*)    Creatinine, Ser 1.50 (*)    GFR calc non Af Amer 30 (*)    GFR calc Af Amer 35 (*)    All other components within normal limits  CULTURE, BLOOD (ROUTINE X 2)  CULTURE, BLOOD  (ROUTINE X 2)  POTASSIUM  BRAIN NATRIURETIC PEPTIDE   ____________________________________________  EKG  ED ECG REPORT I, Lavonia Drafts, the attending physician, personally viewed and interpreted this ECG.  Date: 08/13/2018  Rhythm: Atrial fibrillation QRS Axis: normal Intervals: normal ST/T Wave abnormalities: Abnormal Narrative Interpretation: no evidence of acute ischemia  ____________________________________________  RADIOLOGY  Chest x-ray consistent with pneumonia, mild interstitial edema ____________________________________________   PROCEDURES  Procedure(s) performed: No  Procedures   Critical Care performed: No ____________________________________________   INITIAL IMPRESSION / ASSESSMENT AND PLAN / ED COURSE  Pertinent labs & imaging results that were available during my care of the patient were reviewed by me and considered in my medical decision making (see chart for details).  Patient presents with shortness of breath, overall is well-appearing, concern for pneumonia on chest x-ray.  With ambulation the patient's heart rate increases significantly and she becomes hypoxic with greatly increased work of breathing.  She will require admission for treatment.  She is mildly hyperkalemic as well, no EKG changes.    ____________________________________________   FINAL CLINICAL IMPRESSION(S) / ED DIAGNOSES  Final diagnoses:  Community acquired pneumonia of right upper lobe of lung ([place])  SOB (shortness of breath)        Note:  This document was prepared using Dragon voice recognition software and may include unintentional dictation errors.   Lavonia Drafts, MD 08/17/2018 2245

## 2018-08-15 NOTE — ED Notes (Signed)
Pox with ambulation to 92% on ra, hr 122. Pt appears shob with ambulation. md notified.

## 2018-08-15 NOTE — ED Notes (Signed)
Meal tray and po fluids provided 

## 2018-08-15 NOTE — ED Notes (Signed)
Pt placed on oxygen at 2lpm for pox of 92% on ra.

## 2018-08-15 NOTE — ED Triage Notes (Signed)
Pt to ED via POV c/o shortness of breath x 1 day. Pt states that she thinks that she may be getting a cold as well. Pt states that she has been sneezing and has nasal and chest congestion. Pt is in NAD at this time, able to speak in complete sentences.

## 2018-08-16 ENCOUNTER — Other Ambulatory Visit: Payer: Self-pay

## 2018-08-16 ENCOUNTER — Encounter: Payer: Self-pay | Admitting: Hematology and Oncology

## 2018-08-16 DIAGNOSIS — I509 Heart failure, unspecified: Secondary | ICD-10-CM

## 2018-08-16 MED ORDER — ACETAMINOPHEN 325 MG PO TABS: 650.0000 mg | ORAL_TABLET | Freq: Four times a day (QID) | ORAL | Status: DC | PRN

## 2018-08-16 MED ORDER — ONDANSETRON HCL 4 MG PO TABS: 4.0000 mg | ORAL_TABLET | Freq: Four times a day (QID) | ORAL | Status: DC | PRN

## 2018-08-16 MED ORDER — SODIUM CHLORIDE 0.9 % IV SOLN: 500.0000 mg | INTRAVENOUS | Status: DC

## 2018-08-16 MED ORDER — TORSEMIDE 20 MG PO TABS: 20.0000 mg | ORAL_TABLET | Freq: Every day | ORAL | Status: DC

## 2018-08-16 MED ORDER — FUROSEMIDE 10 MG/ML IJ SOLN: 20.0000 mg | Freq: Once | INTRAMUSCULAR | Status: DC

## 2018-08-16 MED ORDER — ZOLPIDEM TARTRATE 5 MG PO TABS: 5.0000 mg | ORAL_TABLET | Freq: Every day | ORAL | Status: DC

## 2018-08-16 MED ORDER — PAROXETINE HCL 10 MG PO TABS
10.0000 mg | ORAL_TABLET | Freq: Every day | ORAL | Status: DC
  Filled 2018-08-16: qty 1

## 2018-08-16 MED ORDER — APIXABAN 2.5 MG PO TABS
2.5000 mg | ORAL_TABLET | Freq: Two times a day (BID) | ORAL | Status: DC
  Filled 2018-08-16: qty 1

## 2018-08-16 MED ORDER — ATENOLOL 25 MG PO TABS: 25.0000 mg | ORAL_TABLET | Freq: Two times a day (BID) | ORAL | Status: DC

## 2018-08-16 MED ORDER — ATENOLOL 50 MG PO TABS
50.0000 mg | ORAL_TABLET | Freq: Every morning | ORAL | Status: DC
  Filled 2018-08-16: qty 1

## 2018-08-16 MED ORDER — ACETAMINOPHEN 650 MG RE SUPP: 650.0000 mg | Freq: Four times a day (QID) | RECTAL | Status: DC | PRN

## 2018-08-16 MED ORDER — ONDANSETRON HCL 4 MG/2ML IJ SOLN: 4.0000 mg | Freq: Four times a day (QID) | INTRAMUSCULAR | Status: DC | PRN

## 2018-08-16 MED ORDER — GUAIFENESIN-DM 100-10 MG/5ML PO SYRP
5.0000 mL | ORAL_SOLUTION | ORAL | Status: DC | PRN
  Filled 2018-08-16: qty 5

## 2018-08-16 MED ORDER — SODIUM CHLORIDE 0.9 % IV SOLN: 1.0000 g | INTRAVENOUS | Status: DC

## 2018-08-16 MED ORDER — ATENOLOL 25 MG PO TABS: 25.0000 mg | ORAL_TABLET | Freq: Every evening | ORAL | Status: DC

## 2018-08-16 MED ORDER — FAMOTIDINE 20 MG PO TABS: 20.0000 mg | ORAL_TABLET | Freq: Every day | ORAL | Status: DC

## 2018-08-16 MED ORDER — INFLUENZA VAC SPLIT HIGH-DOSE 0.5 ML IM SUSY: 0.5000 mL | PREFILLED_SYRINGE | INTRAMUSCULAR | Status: DC

## 2018-08-16 MED ORDER — DILTIAZEM HCL ER COATED BEADS 180 MG PO CP24
180.0000 mg | ORAL_CAPSULE | Freq: Every day | ORAL | Status: DC
  Filled 2018-08-16: qty 1

## 2018-08-18 NOTE — Progress Notes (Signed)
Chaplain returned upon the arrival of the Pt's sister. Chaplain offered grief support by sitting with sister as she told stories about the Pt and their travel. Chaplain offered encouragement as she and her husband waited for the Pt;s son to arrive. Chaplain had to leave due to page   September 14, 2018 0400  Clinical Encounter Type  Visited With Family  Visit Type Follow-up;Death  Spiritual Encounters  Spiritual Needs Grief support

## 2018-08-18 NOTE — Death Summary Note (Signed)
[place] at [facility]    Death Note - please see Last Note for all details.  PATIENT NAME: Joan Hull    MR#:  [id number]  DATE OF BIRTH:  01-Mar-1926  DATE OF ADMISSION:  07/19/2018  PRIMARY CARE PHYSICIAN: Venia Carbon, MD   ADMISSION DIAGNOSIS:  SOB (shortness of breath) [R06.02] Community acquired pneumonia of right upper lobe of lung ([place]) [J18.1]  HISTORY OF PRESENT ILLNESS ON ADMISSION:  [name]  is a 85 y.o. female who presents with chief complaint as above.  Presents with several days of increased shortness of breath and cough.  She states she has some productive sputum, yellowish in color.  She has had increased shortness of breath.  Here in the ED she is found to have upper lobe pneumonia on chest x-ray.  Hospitalist were called for admission  HOSPITAL COURSE:  Patient was alert and oriented and stable by vital signs tonight.  She requested some medication and when the nurse went back to see her she complained of chest discomfort through to her back, and then went unresponsive.  CPR was started and a code was called.  Pulse was never regained.  She went back and forth between PEA and V. fib.  She was shocked several times and would recapture electrical activity, but she never regained her pulse.  It is unclear exactly what acutely caused her demise, though massive MI, PE, or aortic dissection are all significant possibilities.    Pronounced dead by Jannifer Franklin, Rogan Wigley FIELDING on 06-Sep-2018 at 1:51 AM after 1 full minute of auscultation revealed absent heart and lung sounds, absent corneal and pupillary reflexes, and absent response to painful stimuli.                  Cause of death: Acute cardiovascular event in the setting of community-acquired pneumonia and acute congestive heart failure   Maryon Kemnitz FIELDING Sep 06, 2018, 2:00 AM  [place] at [place]  Total clinical and documentation time for today: <30 minutes  Note:  This document was prepared using Dragon voice recognition software and may include unintentional dictation errors.

## 2018-08-18 NOTE — Progress Notes (Signed)
Chaplain responded to a Code Blue. Chaplain held space and offered silent prayer and team performed CPR. Chaplain notified sister of sudden change in patient and invited her to come to the hospital. Chaplain attempted to notify son but got no answer. Chaplain left a message to please call hospital. Chaplain will return upon sister arrival.   08-26-18 0200  Clinical Encounter Type  Visited With Patient  Visit Type Code  Referral From Physician  Spiritual Encounters  Spiritual Needs Prayer;Emotional

## 2018-08-18 NOTE — Progress Notes (Signed)
RCP responed to code blue. Pt unresponsive, pulseless with agonic respiratory movements.  CPR initiated and continued until approx. 0140 when pt pronounced.

## 2018-08-18 NOTE — Progress Notes (Signed)
Sister [name] here to see sister, notified of death-emotional support given-Chaplin present and prayer given.  Mali is on his way from [facility]-requesting we keep the body on the floor until his arrival.  COPA notified and released.

## 2018-08-18 NOTE — ED Provider Notes (Signed)
[facility] Department of Emergency Medicine   Code Blue CONSULT NOTE  Chief Complaint: Cardiac arrest/unresponsive   Level V Caveat: Unresponsive  History of present illness: I was contacted by the hospital for a CODE BLUE cardiac arrest upstairs and presented to the patient's bedside.  Reportedly the patient had just been moved upstairs after admission for pneumonia.  She was walking and talking and speaking with her nurse when she suddenly said that her abdomen hurt and then she went unresponsive.  When I arrived the nursing staff was performing CPR.  The patient was not breathing and had no pulse.  ROS: Unable to obtain, Level V caveat  Scheduled Meds: . apixaban  2.5 mg Oral BID  . atenolol  50 mg Oral q morning - 10a   And  . atenolol  25 mg Oral QPM  . diltiazem  180 mg Oral Daily  . famotidine  20 mg Oral Daily  . furosemide  20 mg Intravenous Once  . [START ON 08/17/2018] Influenza vac split quadrivalent PF  0.5 mL Intramuscular Tomorrow-1000  . PARoxetine  10 mg Oral Daily  . torsemide  20 mg Oral Daily  . zolpidem  5 mg Oral QHS   Continuous Infusions: . azithromycin (ZITHROMAX) 500 MG IVPB (Vial-Mate [employer])    . cefTRIAXone (ROCEPHIN)  IV     PRN Meds:.acetaminophen **OR** acetaminophen, guaiFENesin-dextromethorphan, ondansetron **OR** ondansetron (ZOFRAN) IV Past Medical History:  Diagnosis Date  . Anxiety   . Arthritis    fingers  . Atrial fibrillation ([place])   . CAD (coronary artery disease)   . CAD (coronary artery disease)   . Cancer ([place]) 2017   non-hodgkins lymphoma  . Depression   . Dysrhythmia    A-Fib  . GERD (gastroesophageal reflux disease)   . H/O Clostridium difficile infection   . Heart murmur   . History of immunotherapy 02/20/2017  . Hx of colonic polyps   . Hyperlipidemia   . Hypertension   . Interstitial cystitis   . Non Q wave myocardial infarction ([place]) 06/27/11   [facility]  . Osteoporosis   . Pacemaker   .  Presence of permanent cardiac pacemaker   . Shortness of breath dyspnea    doe   Past Surgical History:  Procedure Laterality Date  . AXILLARY LYMPH NODE BIOPSY Left 02/06/2016   Procedure: AXILLARY LYMPH NODE BIOPSY;  Surgeon: [name], MD;  Location: [facility] ORS;  Service: General;  Laterality: Left;  . CARDIAC CATHETERIZATION    . CATARACT EXTRACTION Right 2005  . CATARACT EXTRACTION W/PHACO Left 12/28/2017   Procedure: CATARACT EXTRACTION PHACO AND INTRAOCULAR LENS PLACEMENT ([place]) LEFT;  Surgeon: Leandrew Koyanagi, MD;  Location: [place];  Service: Ophthalmology;  Laterality: Left;  . COLONOSCOPY  2007   [name]  . CORONARY ANGIOPLASTY    . CORONARY ARTERY BYPASS GRAFT  1/13   [facility], X4  . CORONARY STENT PLACEMENT  7/12   RCA with drug eluting stent---[name]  . EYE SURGERY    . INSERT / REPLACE / REMOVE PACEMAKER    . PACEMAKER INSERTION  5/13   [facility]   Social History   Socioeconomic History  . Marital status: Widowed    Spouse name: Not on file  . Number of children: 1  . Years of education: Not on file  . Highest education level: Not on file  Occupational History  . 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 Financial resource strain:  Not hard at all  . Food insecurity:    Worry: Never true    Inability: Never true  . Transportation needs:    Medical: No    Non-medical: No  Tobacco Use  . Smoking status: Former Smoker    Packs/day: 1.00    Years: 3.00    Pack years: 3.00    Types: Cigarettes    Last attempt to quit: 08/18/1962    Years since quitting: 56.0  . Smokeless tobacco: Never Used  Substance and Sexual Activity  . Alcohol use: No    Alcohol/week: 0.0 standard drinks  . Drug use: No  . Sexual activity: Not Currently  Lifestyle  . Physical activity:    Days per week: 0 days    Minutes per session: 0 min  . Stress: Not at all  Relationships  . Social connections:    Talks on phone: More than three  times a week    Gets together: Once a week    Attends religious service: More than 4 times per year    Active member of club or organization: Yes    Attends meetings of clubs or organizations: More than 4 times per year    Relationship status: Widowed  . Intimate partner violence:    Fear of current or ex partner: No    Emotionally abused: No    Physically abused: No    Forced sexual activity: No  Other Topics Concern  . Not on file  Social History Narrative   Now has living will   Son [name] is her health care POA. Alternate is sister [name].   Would accept resuscitation attempts--but no prolonged ventilation   No tube feeds if cognitively unaware   Allergies  Allergen Reactions  . Simvastatin Other (See Comments)    SORE JOINTS  . Aspirin Other (See Comments)    Bladder spasm  . Diazepam     REACTION: worsened bladder problems  . Hydrochlorothiazide Other (See Comments)  . Chlorhexidine Rash    Skin reacts only to scrub    Last set of Vital Signs (not current) Vitals:   07/30/2018 2330 09-15-2018 0028  BP: 122/80 139/87  Pulse: (!) 108 (!) 108  Resp:    Temp:  97.8 F (36.6 C)  SpO2: 95% 100%      Physical Exam  Gen: unresponsive Cardiovascular: pulseless  Resp: Intermittent agonal respiration.  Clear breath sounds bilaterally Abd: nondistended  Neuro: GCS 3, unresponsive to pain, no corneal reflex nor gag reflex HEENT: blood in posterior pharynx likely the result of chest compressions, gag reflex absent  Neck: No crepitus  Musculoskeletal: No deformity  Skin: warm  Procedures  INTUBATION Performed by: Hinda Kehr Required items: required blood products, implants, devices, and special equipment available Patient identity confirmed: provided demographic data and hospital-assigned identification number Time out: Immediately prior to procedure a "time out" was called to verify the correct patient, procedure, equipment, support staff and site/side marked  as required. Indications: Unresponsive, CPR in progress Intubation method: Glidescope Go Preoxygenation: BVM Sedatives: none Paralytic: none Tube Size: 7.0 cuffed Post-procedure assessment: chest rise and ETCO2 monitor Breath sounds: equal and absent over the epigastrium Tube secured by Respiratory Therapy Patient tolerated the procedure well with no immediate complications.  CRITICAL CARE Performed by: Hinda Kehr Total critical care time: 20 Critical care time was exclusive of separately billable procedures and treating other patients. Critical care was necessary to treat or prevent imminent or life-threatening deterioration. Critical care was time  spent personally by me on the following activities: development of treatment plan with patient and/or surrogate as well as nursing, discussions with consultants, evaluation of patient's response to treatment, examination of patient, obtaining history from patient or surrogate, ordering and performing treatments and interventions, ordering and review of laboratory studies, ordering and review of radiographic studies, pulse oximetry and re-evaluation of patient's condition.  Cardiopulmonary Resuscitation (CPR) Procedure Note  Directed/Performed by: Hinda Kehr I personally directed ancillary staff and/or performed CPR in an effort to regain return of spontaneous circulation and to maintain cardiac, neuro and systemic perfusion.     Assessment and Plan  Patient unresponsive initially with no respirations and then with rare apneic respiration during rhythm checks during CPR.  See nursing notes for details, but the patient received at least 4 doses of epinephrine, calcium gluconate 1 g IV, amiodarone 300 mg IV, and IV fluids during the period of time that I was directing CPR.  She was primarily in PEA, but during several pulse checks she demonstrated a fine ventricular fibrillation which I defibrillated with 200 J.  Each time after the  defibrillation CPR was resumed immediately and then on the next rhythm check she would be back in PEA.  I do not anticipate this is a survivable event, but once Dr. Jannifer Franklin arrived to the room, I transferred care of the patient and supervision of CPR to him and returned to the emergency department.    Hinda Kehr, MD 08-22-18 [phone]

## 2018-08-18 NOTE — Progress Notes (Signed)
Notified patients son Mali who is POA of mothers change in status and her demise.  He will call me back after he decides if he is coming in or not.

## 2018-08-18 NOTE — Progress Notes (Signed)
While sitting with patient and going over admission questions she began to groan, stating she was having abdominal pain within 10 secs she became unresponsive. Called charge nurse and rapid response when patient began agonal breathing. Code blue called. Time of death called at 39.

## 2018-08-18 DEATH — deceased

## 2018-08-21 LAB — CULTURE, BLOOD (ROUTINE X 2)
CULTURE: NO GROWTH
Culture: NO GROWTH
Special Requests: ADEQUATE
Special Requests: ADEQUATE

## 2018-09-09 ENCOUNTER — Encounter: Payer: PPO | Admitting: Internal Medicine

## 2018-10-21 ENCOUNTER — Other Ambulatory Visit: Payer: PPO

## 2018-10-21 ENCOUNTER — Ambulatory Visit: Payer: PPO | Admitting: Hematology and Oncology
